# Patient Record
Sex: Female | Born: 1970 | Hispanic: No | Marital: Married | Smoking: Current every day smoker
Health system: Southern US, Community
[De-identification: ages and names within clinical notes are randomized; demographics above are authoritative.]

## PROBLEM LIST (undated history)

## (undated) DIAGNOSIS — J449 Chronic obstructive pulmonary disease, unspecified: Secondary | ICD-10-CM

## (undated) DIAGNOSIS — F1910 Other psychoactive substance abuse, uncomplicated: Secondary | ICD-10-CM

## (undated) DIAGNOSIS — R351 Nocturia: Secondary | ICD-10-CM

## (undated) DIAGNOSIS — C3490 Malignant neoplasm of unspecified part of unspecified bronchus or lung: Secondary | ICD-10-CM

## (undated) DIAGNOSIS — F32A Depression, unspecified: Secondary | ICD-10-CM

## (undated) DIAGNOSIS — B20 Human immunodeficiency virus [HIV] disease: Secondary | ICD-10-CM

## (undated) DIAGNOSIS — F329 Major depressive disorder, single episode, unspecified: Secondary | ICD-10-CM

## (undated) DIAGNOSIS — R42 Dizziness and giddiness: Secondary | ICD-10-CM

## (undated) DIAGNOSIS — R350 Frequency of micturition: Secondary | ICD-10-CM

## (undated) DIAGNOSIS — R51 Headache: Secondary | ICD-10-CM

## (undated) DIAGNOSIS — Z8669 Personal history of other diseases of the nervous system and sense organs: Secondary | ICD-10-CM

## (undated) DIAGNOSIS — R0602 Shortness of breath: Secondary | ICD-10-CM

## (undated) DIAGNOSIS — J45909 Unspecified asthma, uncomplicated: Secondary | ICD-10-CM

## (undated) DIAGNOSIS — R1310 Dysphagia, unspecified: Secondary | ICD-10-CM

## (undated) DIAGNOSIS — Z21 Asymptomatic human immunodeficiency virus [HIV] infection status: Secondary | ICD-10-CM

## (undated) DIAGNOSIS — R569 Unspecified convulsions: Secondary | ICD-10-CM

## (undated) HISTORY — DX: Other psychoactive substance abuse, uncomplicated: F19.10

## (undated) HISTORY — DX: Unspecified asthma, uncomplicated: J45.909

## (undated) HISTORY — PX: BREAST SURGERY: SHX581

---

## 2008-12-10 ENCOUNTER — Encounter: Payer: Self-pay | Admitting: Internal Medicine

## 2008-12-10 LAB — CONVERTED CEMR LAB
CD4 Count: 710 microliters
CD4 T Helper %: 47.3 %
HIV 1 RNA Quant: 1440 copies/mL
Platelets: 382 10*3/uL
WBC: 6 10*3/uL

## 2009-06-08 ENCOUNTER — Encounter: Payer: Self-pay | Admitting: Internal Medicine

## 2009-06-08 LAB — CONVERTED CEMR LAB
Glucose, Bld: 92 mg/dL
Hemoglobin: 13 g/dL
Platelets: 337 10*3/uL

## 2009-10-19 ENCOUNTER — Encounter: Payer: Self-pay | Admitting: Infectious Disease

## 2009-10-26 ENCOUNTER — Encounter: Payer: Self-pay | Admitting: Internal Medicine

## 2009-10-26 DIAGNOSIS — B20 Human immunodeficiency virus [HIV] disease: Secondary | ICD-10-CM | POA: Insufficient documentation

## 2009-10-26 DIAGNOSIS — Z9189 Other specified personal risk factors, not elsewhere classified: Secondary | ICD-10-CM | POA: Insufficient documentation

## 2009-10-26 DIAGNOSIS — F17200 Nicotine dependence, unspecified, uncomplicated: Secondary | ICD-10-CM | POA: Insufficient documentation

## 2009-10-26 DIAGNOSIS — J449 Chronic obstructive pulmonary disease, unspecified: Secondary | ICD-10-CM | POA: Insufficient documentation

## 2009-11-15 ENCOUNTER — Ambulatory Visit: Payer: Self-pay | Admitting: Internal Medicine

## 2009-11-15 LAB — CONVERTED CEMR LAB
AST: 22 units/L (ref 0–37)
Albumin: 4.1 g/dL (ref 3.5–5.2)
Chlamydia, Swab/Urine, PCR: NEGATIVE
Eosinophils Absolute: 0.2 10*3/uL (ref 0.0–0.7)
Eosinophils Relative: 3 % (ref 0–5)
HCT: 38.5 % (ref 36.0–46.0)
HCV Ab: NEGATIVE
HDL: 32 mg/dL — ABNORMAL LOW (ref >39)
HIV-1 antibody: POSITIVE — AB
HIV-2 Ab: NEGATIVE
HIV: REACTIVE
Hep A Total Ab: NEGATIVE
Hepatitis B Surface Ag: NEGATIVE
Ketones, ur: NEGATIVE mg/dL
MCV: 90 fL (ref 78.0–100.0)
Monocytes Absolute: 0.5 10*3/uL (ref 0.1–1.0)
Monocytes Relative: 10 % (ref 3–12)
Neutrophils Relative %: 57 % (ref 43–77)
Nitrite: NEGATIVE
RBC: 4.28 M/uL (ref 3.87–5.11)
RDW: 14.3 % (ref 11.5–15.5)
Urobilinogen, UA: 1 (ref 0.0–1.0)
VLDL: 46 mg/dL — ABNORMAL HIGH (ref 0–40)
WBC: 5.5 10*3/uL (ref 4.0–10.5)
pH: 7.5 (ref 5.0–8.0)

## 2009-11-29 ENCOUNTER — Ambulatory Visit: Payer: Self-pay | Admitting: Internal Medicine

## 2009-11-29 DIAGNOSIS — M545 Low back pain, unspecified: Secondary | ICD-10-CM | POA: Insufficient documentation

## 2009-11-29 DIAGNOSIS — N63 Unspecified lump in unspecified breast: Secondary | ICD-10-CM | POA: Insufficient documentation

## 2009-11-29 DIAGNOSIS — F32A Depression, unspecified: Secondary | ICD-10-CM | POA: Insufficient documentation

## 2009-11-29 DIAGNOSIS — F329 Major depressive disorder, single episode, unspecified: Secondary | ICD-10-CM

## 2009-11-30 ENCOUNTER — Encounter: Admission: RE | Admit: 2009-11-30 | Discharge: 2009-11-30 | Payer: Self-pay | Admitting: Internal Medicine

## 2009-12-14 ENCOUNTER — Encounter: Payer: Self-pay | Admitting: Internal Medicine

## 2009-12-22 ENCOUNTER — Ambulatory Visit: Payer: Self-pay | Admitting: Internal Medicine

## 2010-01-05 ENCOUNTER — Ambulatory Visit: Payer: Self-pay | Admitting: Internal Medicine

## 2010-01-12 ENCOUNTER — Ambulatory Visit: Payer: Self-pay | Admitting: Internal Medicine

## 2010-01-12 ENCOUNTER — Encounter: Payer: Self-pay | Admitting: Internal Medicine

## 2010-01-12 DIAGNOSIS — L02229 Furuncle of trunk, unspecified: Secondary | ICD-10-CM | POA: Insufficient documentation

## 2010-01-12 DIAGNOSIS — J209 Acute bronchitis, unspecified: Secondary | ICD-10-CM | POA: Insufficient documentation

## 2010-01-12 LAB — CONVERTED CEMR LAB
Albumin: 3.8 g/dL (ref 3.5–5.2)
BUN: 11 mg/dL (ref 6–23)
CD4 Count: 666 microliters
CO2: 28 meq/L (ref 19–32)
Calcium: 9 mg/dL (ref 8.4–10.5)
Chloride: 103 meq/L (ref 96–112)
Cholesterol: 124 mg/dL (ref 0–200)
Glucose, Bld: 87 mg/dL (ref 70–99)
HDL: 28 mg/dL — ABNORMAL LOW (ref >39)
HIV-1 RNA Quant, Log: 3.17 — ABNORMAL HIGH (ref <1.68)
LDL Cholesterol: 76 mg/dL (ref 0–99)
Potassium: 3.6 meq/L (ref 3.5–5.3)
Sodium: 139 meq/L (ref 135–145)
Total Bilirubin: 0.5 mg/dL (ref 0.3–1.2)
Total CHOL/HDL Ratio: 4.4

## 2010-01-16 ENCOUNTER — Encounter: Payer: Self-pay | Admitting: Internal Medicine

## 2010-01-17 ENCOUNTER — Emergency Department (HOSPITAL_COMMUNITY): Admission: EM | Admit: 2010-01-17 | Discharge: 2010-01-17 | Payer: Self-pay | Admitting: Family Medicine

## 2010-01-24 ENCOUNTER — Encounter: Payer: Self-pay | Admitting: Internal Medicine

## 2010-01-26 ENCOUNTER — Telehealth (INDEPENDENT_AMBULATORY_CARE_PROVIDER_SITE_OTHER): Payer: Self-pay | Admitting: *Deleted

## 2010-03-09 ENCOUNTER — Encounter: Payer: Self-pay | Admitting: Internal Medicine

## 2010-05-24 ENCOUNTER — Ambulatory Visit: Payer: Self-pay | Admitting: Internal Medicine

## 2010-06-07 ENCOUNTER — Ambulatory Visit: Admit: 2010-06-07 | Payer: Self-pay | Admitting: Internal Medicine

## 2010-07-03 NOTE — Miscellaneous (Signed)
Summary: [place] [facility]  [place] D[facility]   Imported By: Florinda Marker 01/08/2010 10:12:41  _____________________________________________________________________  External Attachment:    Type:   Image     Comment:   External Document

## 2010-07-03 NOTE — Miscellaneous (Signed)
Summary: RW Update  Clinical Lists Changes  Observations: Added new observation of PAYOR: No Insurance (03/09/2010 11:05)

## 2010-07-03 NOTE — Assessment & Plan Note (Signed)
Summary: sick/form filled out/jc   CC:  pt. c/o chest congestion, SHOB, boils on abdomen, sore on lip/ and form filled out, and B/P elevated.  History of Present Illness: Pt c/o  days of sore throat and non-productive cough.  She denies fever but has felt very weak. She also has some painful boils on her lower abdomen. She also needs a vocational rehab form filled out. She is trying to get trained for a job.  Depression History:      The patient is having a depressed mood most of the day and has a diminished interest in her usual daily activities.        The patient denies that she feels like life is not worth living, denies that she wishes that she were dead, and denies that she has thought about ending her life.        Preventive Screening-Counseling & Management  Alcohol-Tobacco     Alcohol drinks/day: 0     Smoking Status: quit > 6 months     Year Started: 1990  Caffeine-Diet-Exercise     Caffeine use/day: coffee 1 per day     Does Patient Exercise: yes     Type of exercise: walking     Exercise (avg: min/session): <30     Times/week: <3  Safety-Violence-Falls     Seat Belt Use: yes      Sexual History:  currently monogamous.        Drug Use:  former, crack cocaine, and clean for 13 months.        Blood Transfusions:  no.    Comments: pt. declined condoms   Updated Prior Medication List: PROVENTIL HFA 108 (90 BASE) MCG/ACT AERS (ALBUTEROL SULFATE) use as directed ZOLOFT 50 MG TABS (SERTRALINE HCL) Take 1 tablet by mouth once a day DOXYCYCLINE HYCLATE 100 MG CAPS (DOXYCYCLINE HYCLATE) Take 1 capsule by mouth two times a day TRAZODONE HCL 50 MG TABS (TRAZODONE HCL) Take 1 tablet by mouth at bedtime  Current Allergies (reviewed today): No known allergies  Review of Systems  The patient denies fever, dyspnea on exertion, and hemoptysis.    Vital Signs:  Patient profile:   40 year old female Menstrual status:  irregular Height:      60 inches (152.40  cm) Weight:      151.12 pounds (68.69 kg) BMI:     29.62 O2 Sat:      98 % on Room air Temp:     97.6 degrees F (36.44 degrees C) oral Pulse rate:   88 / minute BP sitting:   147 / 103  (right arm)  Vitals Entered By: Wendall Mola CMA ( AAMA) (January 12, 2010 10:22 AM)  O2 Flow:  Room air CC: pt. c/o chest congestion, SHOB, boils on abdomen, sore on lip/ and form filled out, B/P elevated Is Patient Diabetic? No Pain Assessment Patient in pain? no      Nutritional Status BMI of 25 - 29 = overweight Nutritional Status Detail appetite "not good"  Does patient need assistance? Functional Status Self care Ambulation Normal   Physical Exam  General:  alert, well-developed, well-nourished, and well-hydrated.   Head:  normocephalic and atraumatic.   Mouth:  pharynx pink and moist, no erythema, and no exudates.  apthous ulcer present Neck:  cervical lymphadenopathy.   Lungs:  normal breath sounds.     Impression & Recommendations:  Problem # 1:  ACUTE BRONCHITIS (ICD-466.0) will treat with Doxycycline - it is on the $4  Rx plan so she can get it since she does not have insurance Her updated medication list for this problem includes:    Proventil Hfa 108 (90 Base) Mcg/act Aers (Albuterol sulfate) ..... Use as directed    Doxycycline Hyclate 100 Mg Caps (Doxycycline hyclate) .Marland Kitchen... Take 1 capsule by mouth two times a day  Orders: Est. Patient Level III (91478)  Problem # 2:  CARBUNCLE AND FURUNCLE OF TRUNK (ICD-680.2) treat with doxy as well Orders: Est. Patient Level III (29562)  Medications Added to Medication List This Visit: 1)  Doxycycline Hyclate 100 Mg Caps (Doxycycline hyclate) .... Take 1 capsule by mouth two times a day 2)  Trazodone Hcl 50 Mg Tabs (Trazodone hcl) .... Take 1 tablet by mouth at bedtime Prescriptions: TRAZODONE HCL 50 MG TABS (TRAZODONE HCL) Take 1 tablet by mouth at bedtime  #30 x 0   Entered and Authorized by:   Yisroel Ramming MD   Signed  by:   Yisroel Ramming MD on 01/12/2010   Method used:   Print then Give to Patient   RxID:   [id number] DOXYCYCLINE HYCLATE 100 MG CAPS (DOXYCYCLINE HYCLATE) Take 1 capsule by mouth two times a day  #20 x 0   Entered and Authorized by:   Yisroel Ramming MD   Signed by:   Yisroel Ramming MD on 01/12/2010   Method used:   Print then Give to Patient   RxID:   [id number]

## 2010-07-03 NOTE — Progress Notes (Signed)
Summary: Voc. Rehab. form  Phone Note Outgoing Call   Call placed by: Annice Pih Summary of Call: Notified pt. that the Vocational Rehab. form was ready to be picked up.  She will p/u this afternoon. Initial call taken by: Wendall Mola CMA Duncan Dull),  January 26, 2010 8:43 AM

## 2010-07-03 NOTE — Assessment & Plan Note (Signed)
Summary: new pt 042 labs/6/15   CC:  new patient to establish, lab results, c/o lump left side of breast x 1 year, and was told at [facility]. 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  History of Present Illness: This is the first ID clinic visit for [facility]  She was diagnosed HIV (+) in 2008.  She has never been on therapy and has been asymptomatic with a low VL and high Cd4ct.  She was recently released from prison and is staying with her Mother. She is going through vocational rehab and is trying to get trained for a job.  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  She also c/o low back pain that is not responding to NSAIDs.  Preventive Screening-Counseling & Management  Alcohol-Tobacco     Alcohol drinks/day: 0     Smoking Status: quit > 6 months     Year Started: 1990  Caffeine-Diet-Exercise     Caffeine use/day: coffee 1 per day     Does Patient Exercise: yes     Type of exercise: walking     Exercise (avg: min/session): <30     Times/week: <3  Safety-Violence-Falls     Seat Belt Use: yes      Sexual History:  currently monogamous.        Drug Use:  former, crack cocaine, and clean for 13 months.        Blood Transfusions:  no.    Comments: pt. declined condoms   Current Allergies (reviewed today): No known allergies  Social History: Sexual History:  currently monogamous Drug Use:  former, crack cocaine, clean for 13 months Blood Transfusions:  no  Review of Systems  The patient denies anorexia, fever, and weight loss.    Vital Signs:  Patient profile:   40 year old female Height:      60 inches (152.40 cm) Weight:      158.4 pounds (72 kg) BMI:     31.05 Temp:     98.2 degrees F (36.78 degrees C) oral Pulse rate:   84 / minute BP sitting:   119 / 79  (left arm)  Vitals Entered By: Wendall Mola CMA Duncan Dull) (November 29, 2009 11:10 AM) CC: new patient to establish, lab results, c/o lump left side of  breast x 1 year, was told at [facility]. 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 No Pain Assessment Patient in pain? yes     Location: low back Intensity: 8 Type: aching Onset of pain  Constant Nutritional Status BMI of > 30 = obese Nutritional Status Detail appetite "great"  Have you ever been in a relationship where you felt threatened, hurt or afraid?No   Does patient need assistance? Functional Status Self care Ambulation Normal   Physical Exam  General:  alert, well-developed, well-nourished, and well-hydrated.   Head:  normocephalic and atraumatic.   Mouth:  pharynx pink and moist.  no thrush  Breasts:  soft tissue mass lateral to right breast Lungs:  normal breath sounds.   Heart:  normal rate and regular rhythm.             Prevention For Positives: 11/29/2009   Safe sex practices discussed with patient. Condoms offered.                             Impression & Recommendations:  Problem # 1:  HIV INFECTION (ICD-042) Pt.s most recent CD4ct was 610 and VL 3810 . The CD4ct is significantly lower than the one done in prison in 1/11 of 1240. We will repeat in 3 months. Pt currently not on therapy.  Schedule PAP.  Diagnostics Reviewed:  HIV: REACTIVE (11/15/2009)   HIV-Western blot: Positive (11/15/2009)   CD4: 610 (11/16/2009)   WBC: 5.5 (11/15/2009)   Hgb: 12.9 (11/15/2009)   HCT: 38.5 (11/15/2009)   Platelets: 330 (11/15/2009) HIV genotype: See Comment (11/15/2009)   HIV-1 RNA: 3810 (11/15/2009)   HBSAg: NEG (11/15/2009)  Orders: New Patient Level III (99203)Future Orders: T-CD4SP ([facility]) (CD4SP) ... 02/27/2010 T-HIV Viral Load [id number]) ... 02/27/2010 T-Comprehensive Metabolic Panel [phone]) ... 02/27/2010 T-CBC w/Diff ([id number]) ... 02/27/2010  Problem # 2:  BREAST MASS, LEFT (ICD-611.72) will obtain a mammogram Orders: Mammogram (Diagnostic) (Mammo)  Problem # 3:  BACK PAIN, LUMBAR (ICD-724.2) ultram for pain. Her updated  medication list for this problem includes:    Ultram 50 Mg Tabs (Tramadol hcl) .Marland Kitchen... Take 1 tablet by mouth every 8 hours as needed  Medications Added to Medication List This Visit: 1)  Prozac 20 Mg Caps (Fluoxetine hcl) .... Take 1 tablet by mouth once a day 2)  Proventil Hfa 108 (90 Base) Mcg/act Aers (Albuterol sulfate) .... Use as directed 3)  Ultram 50 Mg Tabs (Tramadol hcl) .... Take 1 tablet by mouth every 8 hours as needed  Patient Instructions: 1)  Please schedule a follow-up appointment in 3 months, 2 weeks after labs. 2)  Schedule in PAP clinic  Prescriptions: ULTRAM 50 MG TABS (TRAMADOL HCL) Take 1 tablet by mouth every 8 hours as needed  #30 x 0   Entered and Authorized by:   Yisroel Ramming MD   Signed by:   Yisroel Ramming MD on 11/29/2009   Method used:   Print then Give to Patient   RxID:   [id number]

## 2010-07-03 NOTE — Miscellaneous (Signed)
Summary: [facility]  [facility]   Imported By: Florinda Marker 01/25/2010 15:13:31  _____________________________________________________________________  External Attachment:    Type:   Image     Comment:   External Document

## 2010-07-03 NOTE — Consult Note (Signed)
Summary: New Pt. [employer]   New Pt. [facility]   Imported By: Florinda Marker 11/30/2009 16:05:25  _____________________________________________________________________  External Attachment:    Type:   Image     Comment:   External Document

## 2010-07-03 NOTE — Miscellaneous (Signed)
Summary: HIPAA Restrictions  HIPAA Restrictions   Imported By: Florinda Marker 11/17/2009 09:57:28  _____________________________________________________________________  External Attachment:    Type:   Image     Comment:   External Document

## 2010-07-03 NOTE — Assessment & Plan Note (Signed)
Summary: RESEARCH/VS   Vital Signs:  Patient profile:   40 year old female Menstrual status:  irregular Height:      61 inches Weight:      151.5 pounds BMI:     28.73 Temp:     97.4 degrees F oral Pulse rate:   86 / minute Resp:     16 per minute BP sitting:   117 / 68 Is Patient Diabetic? No Research Study Name: START Pain Assessment Patient in pain? yes     Location: throat Nutritional Status BMI of > 30 = obese  Does patient need assistance? Functional Status Self care Ambulation Normal   Allergies: No Known Drug Allergies  Patient screened for the START study today. Informed consent obtained after she read the consent and all questions were answered. She understands that she needs to keep her appointments and follow the study guidelines (if prescribed medication she agrees to take it as directed). She also agreed to the neurology and genomics study today. The neuro tests were performed per protocol. Grooved pegboard results: Rt hand (dominant) - 1 min. 24 sec., Lt Hand - 1 min.42 sec. The other questionnaires, EKG and urine test will be done at the next visit when she comes for a repeat CD4. She does not feel well today and says she has been sick the past week, congested cough, sore throat, bodyaches. She also has 2 sores on her abdomen that developed this past week. She will see Dr. Philipp Deputy at this visit for her signs.Deirdre Evener RN  January 12, 2010 11:29 AM   Other Orders: Est. Patient Research Study [id number]) T-Comprehensive Metabolic Panel [phone]) T-Lipid Profile [phone]) T-HIV Viral Load [phone]) T-Urinalysis Dipstick only [phone])

## 2010-07-03 NOTE — Consult Note (Signed)
Summary: Dr. Freida Busman  Dr. Freida Busman   Imported By: Florinda Marker 01/11/2010 10:48:20  _____________________________________________________________________  External Attachment:    Type:   Image     Comment:   External Document

## 2010-07-03 NOTE — Assessment & Plan Note (Signed)
Summary: PAP SMEAR visit   Vital Signs:  Patient profile:   40 year old female Menstrual status:  irregular LMP:     12/15/2009  Vitals Entered By: Jennet Maduro RN (January 05, 2010 11:40 AM) CC: PAP smear visit.  Pt. given female condoms and dental dams.  Pt. given educational materials re:  HIV and women, diet, nutrition, exercise, co-infection, BSE and PAP smears.  Pt. has left breast mass which is being followed by MD. LMP (date): 12/15/2009 LMP - Character: normal     Menstrual Status irregular Enter LMP: 12/15/2009   Patient Instructions: 1)  Thank you for coming to the PAP smear clinic.  It will take about a week for the results to come back.  I will mail you a letter with your results.  2)  Angelique Blonder   Medical History  Sexual History  Evaluation and Follow-Up  Prevention For Positives: 01/05/2010   Safe sex practices discussed with patient. Condoms offered. Prior Medications: PROVENTIL HFA 108 (90 BASE) MCG/ACT AERS (ALBUTEROL SULFATE) use as directed ZOLOFT 50 MG TABS (SERTRALINE HCL) Take 1 tablet by mouth once a day Current Allergies: No known allergies  Orders Added: 1)  Est. Patient Level I [86578] 2)  T-PAP [facility]) [46962]             Prevention For Positives: 01/05/2010   Safe sex practices discussed with patient. Condoms offered.

## 2010-07-03 NOTE — Miscellaneous (Signed)
Summary:  CD4 (RESEARCH)  Clinical Lists Changes  Observations: Added new observation of CD4 COUNT: 666 microliters (01/12/2010 8:48)

## 2010-07-03 NOTE — Letter (Signed)
Summary: [facility] [facility]  [facility]   Imported By: Florinda Marker 01/26/2010 15:16:32  _____________________________________________________________________  External Attachment:    Type:   Image     Comment:   External Document

## 2010-07-03 NOTE — Miscellaneous (Signed)
Summary: New 042 orders /update  Clinical Lists Changes  Problems: Added new problem of HIV INFECTION (ICD-042) Added new problem of History of  COCAINE ABUSE, HX OF (ICD-V15.9) Added new problem of ASTHMA (ICD-493.90) Added new problem of SMOKER (ICD-305.1) Orders: Added new Test order of T-Comprehensive Metabolic Panel [phone]) - Signed Added new Test order of T-CBC w/Diff [phone]) - Signed Added new Test order of T-CD4SP [facility]) (CD4SP) - Signed Added new Test order of T-HIV Viral Load [phone]) - Signed Added new Test order of T-Chlamydia  Probe, urine [phone]) - Signed Added new Test order of T-GC Probe, urine [phone]) - Signed Added new Test order of T-Hepatitis B Surface Antigen [phone]) - Signed Added new Test order of T-Hepatitis B Surface Antibody ([phone]) - Signed Added new Test order of T-Hepatitis B Core Antibody ([id number]) - Signed Added new Test order of T-Hepatitis C Antibody ([id number]) - Signed Added new Test order of T-HIV Antibody  (Reflex) ([id number]) - Signed Added new Test order of T-HIV Ab Confirmatory Test/Western Blot ([id number]) - Signed Added new Test order of T-Lipid Profile ([id number]) - Signed Added new Test order of T-RPR (Syphilis) ([id number]) - Signed Added new Test order of T-Urinalysis ([id number]) - Signed Added new Test order of T-Hepatitis A Antibody ([id number]) - Signed Added new Test order of T-HIV1 Quant rflx Ultra or Genotype ([id number]) - Signed Observations: Added new observation of TB PPDRESULT: negative (10/26/2009 15:33) Added new observation of PPD RESULT: < 5mm (10/26/2009 15:33) Added new observation of TB-PPD RDDTE: 11/30/2008 (10/26/2009 15:33) Added new observation of CREATININE: .61 mg/dL (71/69/6789 38:10) Added new observation of PLATELETK/UL: 337 K/uL (06/08/2009 16:12) Added new observation of HGB: 13.0 g/dL (17/51/0258 52:77) Added new observation of GLUCOSE SER: 92  mg/dL (82/42/3536 14:43) Added new observation of CD4 COUNT: 1240 microliters (06/08/2009 16:07) Added new observation of HIV1RNA QA: 4240 copies/mL (06/08/2009 15:58) Added new observation of T-HELPER %: 47.7 % (06/08/2009 15:41) Added new observation of CD4 COUNT: 240 microliters (06/08/2009 15:41) Added new observation of FLU VAX: Historical  (04/20/2009 15:38) Added new observation of RPR: non reactive  (12/10/2008 16:20) Added new observation of HIV1RNA QA: 1440 copies/mL (12/10/2008 16:20) Added new observation of PLATELETK/UL: 382 K/uL (12/10/2008 16:19) Added new observation of WBC COUNT: 6.0 10*3/microliter (12/10/2008 16:19) Added new observation of HGB: 13.6 g/dL (15/40/0867 61:95) Added new observation of T-HELPER %: 47.3 % (12/10/2008 16:19) Added new observation of CD4 COUNT: 710 microliters (12/10/2008 16:19)         Immunization History:  Influenza Immunization History:    Influenza:  historical (04/20/2009)  PPD Results    Date of reading: 11/30/2008    Results: < 5mm    Interpretation: negative    -  Date:  06/08/2009    CD4: 1240    CD4: 240    CD4%: 47.7    Viral Load: 4240    Glucose: 92    Hemoglobin: 13.0    Platelets: 337    Creatinine: .61  Date:  12/10/2008    CD4: 710    CD4%: 47.3    Viral Load: 1440    Hemoglobin: 13.6    Platelets: 382    WBC: 6.0    RPR: non reactive

## 2010-07-03 NOTE — Assessment & Plan Note (Signed)
Summary: ?MEDS/VS   CC:  pt. c/o pain medication not working and Prozac causing "heart to race and unable to sleep".  History of Present Illness: Pt states that the prozac is making her heart race and she is unable to sleep.  She states that her dose was increased prior to her discharge from prison.  She does c/o depression and has an appt scheduled at [facility]. She also is going to have the lipoma on her left side removed but is awaiting her orange card to schedule that.  The ultram is not helping her back.  Preventive Screening-Counseling & Management  Alcohol-Tobacco     Alcohol drinks/day: 0     Smoking Status: quit > 6 months     Year Started: 1990  Caffeine-Diet-Exercise     Caffeine use/day: coffee 1 per day     Does Patient Exercise: yes     Type of exercise: walking     Exercise (avg: min/session): <30     Times/week: <3  Safety-Violence-Falls     Seat Belt Use: yes      Sexual History:  currently monogamous.        Drug Use:  former, crack cocaine, and clean for 13 months.        Blood Transfusions:  no.     Updated Prior Medication List: PROVENTIL HFA 108 (90 BASE) MCG/ACT AERS (ALBUTEROL SULFATE) use as directed ZOLOFT 50 MG TABS (SERTRALINE HCL) Take 1 tablet by mouth once a day  Current Allergies (reviewed today): No known allergies  Review of Systems  The patient denies anorexia, fever, and weight loss.    Vital Signs:  Patient profile:   40 year old female Height:      60 inches (152.40 cm) Weight:      156.12 pounds (70.96 kg) BMI:     30.60 Temp:     98.1 degrees F (36.72 degrees C) oral Pulse rate:   92 / minute BP sitting:   115 / 77  (left arm)  Vitals Entered By: Wendall Mola CMA Duncan Dull) (December 22, 2009 11:03 AM) CC: pt. c/o pain medication not working and Prozac causing "heart to race and unable to sleep" Is Patient Diabetic? No Pain Assessment Patient in pain? yes     Location: back Intensity: 9 Type: aching Onset of  pain  Constant Nutritional Status BMI of > 30 = obese Nutritional Status Detail appetite "normal"  Does patient need assistance? Functional Status Self care Ambulation Normal Comments established with [facility], appt.01/01/10   Physical Exam  General:  alert, well-developed, well-nourished, and well-hydrated.   Head:  normocephalic and atraumatic.   Lungs:  normal breath sounds.     Impression & Recommendations:  Problem # 1:  DEPRESSION (ICD-311) will switch to zoloft f/u at [facility] The following medications were removed from the medication list:    Prozac 20 Mg Caps (Fluoxetine hcl) .Marland Kitchen... Take 1 tablet by mouth once a day Her updated medication list for this problem includes:    Zoloft 50 Mg Tabs (Sertraline hcl) .Marland Kitchen... Take 1 tablet by mouth once a day  Problem # 2:  BACK PAIN, LUMBAR (ICD-724.2) refer to PT The following medications were removed from the medication list:    Ultram 50 Mg Tabs (Tramadol hcl) .Marland Kitchen... Take 1 tablet by mouth every 8 hours as needed  Orders: Physical Therapy Referral (PT) Est. Patient Level III (45409)  Medications Added to Medication List This Visit: 1)  Zoloft 50 Mg Tabs (  Sertraline hcl) .... Take 1 tablet by mouth once a day Prescriptions: ZOLOFT 50 MG TABS (SERTRALINE HCL) Take 1 tablet by mouth once a day  #30 x 5   Entered and Authorized by:   Yisroel Ramming MD   Signed by:   Yisroel Ramming MD on 12/22/2009   Method used:   Print then Give to Patient   RxID:   [id number]

## 2010-07-03 NOTE — Letter (Signed)
Summary: Results Follow-up Letter  [facility]  [address]   [place], [place] [address]   Phone: [phone]  Fax: [phone]          January 16, 2010  [address] [place], [place]  [address]  Dear Ms. Oriley,   The following are the results of your recent test(s):  Test     Result     Pap Smear    Normal_XXX___  Not Normal_____       Comments:  Everything was normal.  I will see you in one year for your next PAP smear.    Thank you for coming to the Center for your care.  Sincerely,    Jennet Maduro [facility]

## 2010-07-13 ENCOUNTER — Encounter (INDEPENDENT_AMBULATORY_CARE_PROVIDER_SITE_OTHER): Payer: Self-pay | Admitting: *Deleted

## 2010-07-16 ENCOUNTER — Encounter (INDEPENDENT_AMBULATORY_CARE_PROVIDER_SITE_OTHER): Payer: Self-pay | Admitting: *Deleted

## 2010-07-19 NOTE — Miscellaneous (Signed)
  Clinical Lists Changes  Observations: Added new observation of LATINO/HISP: No (07/13/2010 12:21) Added new observation of RACE: [place] (07/13/2010 12:21)

## 2010-07-25 ENCOUNTER — Encounter (INDEPENDENT_AMBULATORY_CARE_PROVIDER_SITE_OTHER): Payer: Self-pay | Admitting: *Deleted

## 2010-07-25 NOTE — Miscellaneous (Signed)
  Clinical Lists Changes  Observations: Added new observation of PATNTCOUNTY: [place] (07/16/2010 16:18)

## 2010-07-31 NOTE — Miscellaneous (Signed)
  Clinical Lists Changes  Observations: Added new observation of HIV RISK BEH: Heterosexual contact (07/25/2010 16:55)

## 2010-08-17 LAB — CULTURE, ROUTINE-ABSCESS

## 2010-08-19 LAB — T-HELPER CELL (CD4) - (RCID CLINIC ONLY): CD4 % Helper T Cell: 39 % (ref 33–55)

## 2010-08-28 ENCOUNTER — Other Ambulatory Visit: Payer: Self-pay

## 2010-08-28 DIAGNOSIS — B20 Human immunodeficiency virus [HIV] disease: Secondary | ICD-10-CM

## 2010-08-29 LAB — CBC WITH DIFFERENTIAL/PLATELET
Eosinophils Relative: 3 % (ref 0–5)
HCT: 39.3 % (ref 36.0–46.0)
Hemoglobin: 13.3 g/dL (ref 12.0–15.0)
MCH: 30.2 pg (ref 26.0–34.0)
Monocytes Relative: 7 % (ref 3–12)
Neutro Abs: 4.3 10*3/uL (ref 1.7–7.7)
Platelets: 521 10*3/uL — ABNORMAL HIGH (ref 150–400)
RBC: 4.4 MIL/uL (ref 3.87–5.11)
RDW: 14.7 % (ref 11.5–15.5)

## 2010-08-29 LAB — LIPID PANEL
HDL: 31 mg/dL — ABNORMAL LOW (ref >39)
LDL Cholesterol: 99 mg/dL (ref 0–99)

## 2010-08-29 LAB — COMPLETE METABOLIC PANEL WITH GFR
AST: 10 U/L (ref 0–37)
Calcium: 9 mg/dL (ref 8.4–10.5)
GFR, Est African American: >60 mL/min (ref >60)
GFR, Est Non African American: >60 mL/min (ref >60)
Total Protein: 7.3 g/dL (ref 6.0–8.3)

## 2010-08-29 LAB — T-HELPER CELL (CD4) - (RCID CLINIC ONLY): CD4 % Helper T Cell: 48 % (ref 33–55)

## 2010-08-30 LAB — HIV-1 RNA QUANT-NO REFLEX-BLD
HIV 1 RNA Quant: 5770 copies/mL — ABNORMAL HIGH (ref <20)
HIV-1 RNA Quant, Log: 3.76 {Log} — ABNORMAL HIGH (ref <1.30)

## 2010-09-13 ENCOUNTER — Ambulatory Visit: Payer: Self-pay | Admitting: Adult Health

## 2011-03-12 ENCOUNTER — Inpatient Hospital Stay (INDEPENDENT_AMBULATORY_CARE_PROVIDER_SITE_OTHER)
Admission: RE | Admit: 2011-03-12 | Discharge: 2011-03-12 | Disposition: A | Discharge status: Home / Self Care | Payer: Self-pay | Source: Ambulatory Visit | Attending: Emergency Medicine | Admitting: Emergency Medicine

## 2011-03-12 DIAGNOSIS — I10 Essential (primary) hypertension: Secondary | ICD-10-CM

## 2011-03-12 DIAGNOSIS — L270 Generalized skin eruption due to drugs and medicaments taken internally: Secondary | ICD-10-CM

## 2011-03-12 DIAGNOSIS — T50995A Adverse effect of other drugs, medicaments and biological substances, initial encounter: Secondary | ICD-10-CM

## 2011-05-02 ENCOUNTER — Encounter: Payer: Self-pay | Admitting: *Deleted

## 2011-05-14 ENCOUNTER — Other Ambulatory Visit: Payer: Self-pay | Admitting: *Deleted

## 2011-05-14 ENCOUNTER — Other Ambulatory Visit: Payer: Self-pay | Admitting: Infectious Diseases

## 2011-05-14 ENCOUNTER — Telehealth: Payer: Self-pay | Admitting: *Deleted

## 2011-05-14 ENCOUNTER — Other Ambulatory Visit (INDEPENDENT_AMBULATORY_CARE_PROVIDER_SITE_OTHER): Payer: Self-pay

## 2011-05-14 DIAGNOSIS — B20 Human immunodeficiency virus [HIV] disease: Secondary | ICD-10-CM

## 2011-05-14 DIAGNOSIS — N912 Amenorrhea, unspecified: Secondary | ICD-10-CM

## 2011-05-14 NOTE — Telephone Encounter (Signed)
The test was negative for pregnancy. I told her to call if she does not get her period in the next 3 weeks. Gave her 2 bags of condoms. Suggested plenty of lube so it does not break again. She asked for dental dams. Told her we no longer give them out.

## 2011-05-14 NOTE — Progress Notes (Signed)
States her condom broke about 2 weeks ago & she has missed her menses. Urine pregnancy ordered

## 2011-05-15 LAB — T-HELPER CELL (CD4) - (RCID CLINIC ONLY)
CD4 % Helper T Cell: 45 % (ref 33–55)
CD4 T Cell Abs: 930 uL (ref 400–2700)

## 2011-05-15 LAB — COMPLETE METABOLIC PANEL WITH GFR
ALT: 8 U/L (ref 0–35)
AST: 14 U/L (ref 0–37)
Albumin: 3.8 g/dL (ref 3.5–5.2)
Alkaline Phosphatase: 43 U/L (ref 39–117)
BUN: 12 mg/dL (ref 6–23)
CO2: 22 mEq/L (ref 19–32)
Calcium: 9.2 mg/dL (ref 8.4–10.5)
Chloride: 105 mEq/L (ref 96–112)
Creat: 0.56 mg/dL (ref 0.50–1.10)
GFR, Est African American: >89 mL/min
GFR, Est Non African American: >89 mL/min
Glucose, Bld: 60 mg/dL — ABNORMAL LOW (ref 70–99)
Potassium: 4.3 mEq/L (ref 3.5–5.3)
Sodium: 136 mEq/L (ref 135–145)
Total Bilirubin: 0.4 mg/dL (ref 0.3–1.2)
Total Protein: 7.1 g/dL (ref 6.0–8.3)

## 2011-05-15 LAB — CBC WITH DIFFERENTIAL/PLATELET
Basophils Relative: 0 % (ref 0–1)
Eosinophils Relative: 2 % (ref 0–5)
HCT: 41.2 % (ref 36.0–46.0)
Hemoglobin: 13.9 g/dL (ref 12.0–15.0)
Lymphocytes Relative: 29 % (ref 12–46)
Lymphs Abs: 2 10*3/uL (ref 0.7–4.0)
MCH: 31 pg (ref 26.0–34.0)
MCV: 91.8 fL (ref 78.0–100.0)
RDW: 15.1 % (ref 11.5–15.5)
WBC: 7 10*3/uL (ref 4.0–10.5)

## 2011-05-16 LAB — HIV-1 RNA QUANT-NO REFLEX-BLD
HIV 1 RNA Quant: 5750 copies/mL — ABNORMAL HIGH (ref <20)
HIV-1 RNA Quant, Log: 3.76 {Log} — ABNORMAL HIGH (ref <1.30)

## 2011-05-22 ENCOUNTER — Telehealth: Payer: Self-pay

## 2011-05-22 NOTE — Telephone Encounter (Signed)
Left message for patient to call back concerning [name] financial asst - will try and coordinate with her Dr appt in January.

## 2011-06-11 ENCOUNTER — Ambulatory Visit: Payer: Self-pay | Admitting: Infectious Disease

## 2011-06-12 ENCOUNTER — Telehealth: Payer: Self-pay | Admitting: Licensed Clinical Social Worker

## 2011-06-12 NOTE — Telephone Encounter (Signed)
I spoke with this patient and rescheduled her for 06/19/2011 at 10:15am

## 2011-06-19 ENCOUNTER — Telehealth: Payer: Self-pay | Admitting: *Deleted

## 2011-06-19 ENCOUNTER — Ambulatory Visit: Payer: Self-pay | Admitting: Infectious Disease

## 2011-06-19 NOTE — Telephone Encounter (Signed)
Called patient to reschedule OV. Pt stated she did not have a ride today to come to OV today. Rescheduled for 06/28/11 at 10"45 am. Tacey Heap RN

## 2011-06-28 ENCOUNTER — Ambulatory Visit: Payer: Self-pay | Admitting: Infectious Disease

## 2012-05-30 ENCOUNTER — Emergency Department (HOSPITAL_BASED_OUTPATIENT_CLINIC_OR_DEPARTMENT_OTHER)
Admission: EM | Admit: 2012-05-30 | Discharge: 2012-05-31 | Disposition: A | Discharge status: Home / Self Care | Payer: Self-pay | Attending: Emergency Medicine | Admitting: Emergency Medicine

## 2012-05-30 ENCOUNTER — Encounter (HOSPITAL_BASED_OUTPATIENT_CLINIC_OR_DEPARTMENT_OTHER): Payer: Self-pay | Admitting: *Deleted

## 2012-05-30 DIAGNOSIS — Z79899 Other long term (current) drug therapy: Secondary | ICD-10-CM | POA: Insufficient documentation

## 2012-05-30 DIAGNOSIS — B20 Human immunodeficiency virus [HIV] disease: Secondary | ICD-10-CM | POA: Insufficient documentation

## 2012-05-30 DIAGNOSIS — F17200 Nicotine dependence, unspecified, uncomplicated: Secondary | ICD-10-CM | POA: Insufficient documentation

## 2012-05-30 DIAGNOSIS — L0291 Cutaneous abscess, unspecified: Secondary | ICD-10-CM

## 2012-05-30 DIAGNOSIS — IMO0002 Reserved for concepts with insufficient information to code with codable children: Secondary | ICD-10-CM | POA: Insufficient documentation

## 2012-05-30 DIAGNOSIS — G40909 Epilepsy, unspecified, not intractable, without status epilepticus: Secondary | ICD-10-CM | POA: Insufficient documentation

## 2012-05-30 HISTORY — DX: Asymptomatic human immunodeficiency virus (hiv) infection status: Z21

## 2012-05-30 HISTORY — DX: Human immunodeficiency virus (HIV) disease: B20

## 2012-05-30 HISTORY — DX: Unspecified convulsions: R56.9

## 2012-05-30 MED ORDER — LIDOCAINE HCL 2 % IJ SOLN
5.0000 mL | Freq: Once | INTRAMUSCULAR | Status: DC
  Filled 2012-05-30: qty 20

## 2012-05-30 NOTE — ED Notes (Signed)
Suture cart with I&D tray out side of pt room

## 2012-05-30 NOTE — ED Notes (Addendum)
Boil right axillary region x 2 days. Hx same. No hx of MRSA, "but mother does". In tx now for cocaine use. HIV+

## 2012-05-31 NOTE — ED Provider Notes (Signed)
Medical screening examination/treatment/procedure(s) were performed by non-physician practitioner and as supervising physician I was immediately available for consultation/collaboration.  Ethelda Chick, MD 05/31/12 1725

## 2012-05-31 NOTE — ED Provider Notes (Signed)
History     CSN: [id number]  Arrival date & time 05/30/12  2015   First MD Initiated Contact with Patient 05/30/12 2223      Chief Complaint  Patient presents with  . Recurrent Skin Infections    (Consider location/radiation/quality/duration/timing/severity/associated sxs/prior treatment) Patient is a 41 y.o. female presenting with abscess. The history is provided by the patient. No language interpreter was used.  Abscess  This is a recurrent problem. The current episode started less than one week ago. The onset was gradual. The problem occurs occasionally. The problem has been gradually worsening. Affected Location: Right axilla. The problem is mild. The abscess is characterized by redness and painfulness. Pertinent negatives include no fever and no vomiting.   2 cm abscess to right axilla x3 days. Tender to touch and move her right arm.   Past Medical History  Diagnosis Date  . HIV (human immunodeficiency virus infection)   . Seizures     Past Surgical History  Procedure Date  . Cesarean section     History reviewed. No pertinent family history.  History  Substance Use Topics  . Smoking status: Current Every Day Smoker  . Smokeless tobacco: Not on file  . Alcohol Use: No    OB History    Grav Para Term Preterm Abortions TAB SAB Ect Mult Living                  Review of Systems  Constitutional: Negative.  Negative for fever.  HENT: Negative.   Eyes: Negative.   Respiratory: Negative.   Cardiovascular: Negative.   Gastrointestinal: Negative.  Negative for vomiting.  Skin:       Abscess  Neurological: Negative.   Psychiatric/Behavioral: Negative.   All other systems reviewed and are negative.    Allergies  Gabapentin  Home Medications   Current Outpatient Rx  Name  Route  Sig  Dispense  Refill  . DIVALPROEX SODIUM 250 MG PO TBEC   Oral   Take 250 mg by mouth daily at 2 am.         . QUETIAPINE FUMARATE 200 MG PO TABS   Oral   Take 200 mg by  mouth at bedtime.         . ALBUTEROL SULFATE HFA 108 (90 BASE) MCG/ACT IN AERS   Inhalation   Inhale 2 puffs into the lungs as needed.           . SERTRALINE HCL 50 MG PO TABS   Oral   Take 50 mg by mouth daily.           . TRAZODONE HCL 50 MG PO TABS   Oral   Take 50 mg by mouth at bedtime.             BP 107/76  Pulse 96  Temp 98.4 F (36.9 C) (Oral)  Resp 20  Ht 5' (1.524 m)  Wt 115 lb (52.164 kg)  BMI 22.46 kg/m2  SpO2 100%  LMP 05/23/2012  Physical Exam  Nursing note and vitals reviewed. Constitutional: She is oriented to person, place, and time. She appears well-developed and well-nourished.  HENT:  Head: Normocephalic and atraumatic.  Eyes: Conjunctivae normal and EOM are normal. Pupils are equal, round, and reactive to light.  Neck: Normal range of motion. Neck supple.  Cardiovascular: Normal rate.   Pulmonary/Chest: Effort normal.  Abdominal: Soft.  Musculoskeletal: Normal range of motion. She exhibits no edema and no tenderness.  Neurological: She is alert and oriented  to person, place, and time. She has normal reflexes.  Skin: Skin is warm and dry.       Abscess under right arm 2 cm no cellulitis  Psychiatric: She has a normal mood and affect.    ED Course  INCISION AND DRAINAGE Date/Time: 05/30/2012 5:20 PM Performed by: Remi Haggard Authorized by: Remi Haggard Consent: Verbal consent obtained. Risks and benefits: risks, benefits and alternatives were discussed Consent given by: patient Patient understanding: patient states understanding of the procedure being performed Patient identity confirmed: verbally with patient, arm band, provided demographic data and hospital-assigned identification number Type: abscess Body area: upper extremity (Right axilla) Anesthesia: local infiltration Local anesthetic: lidocaine 2% without epinephrine Anesthetic total: 2 ml Patient sedated: no Scalpel size: 11 Complexity: simple Drainage amount:  scant Wound treatment: wound left open Patient tolerance: Patient tolerated the procedure well with no immediate complications.   (including critical care time)  Labs Reviewed - No data to display No results found.   No diagnosis found.    MDM  I&D of right axilla abscess 2 cm no packing. Disorder card and no followup because it was so small. She can take ibuprofen for pain. No cellulitis        Remi Haggard, NP 05/31/12 1722

## 2013-03-29 ENCOUNTER — Emergency Department (HOSPITAL_COMMUNITY): Payer: Self-pay

## 2013-03-29 ENCOUNTER — Emergency Department (HOSPITAL_COMMUNITY): Payer: No Typology Code available for payment source

## 2013-03-29 ENCOUNTER — Emergency Department (HOSPITAL_COMMUNITY)
Admission: EM | Admit: 2013-03-29 | Discharge: 2013-03-30 | Disposition: A | Discharge status: Home / Self Care | Payer: No Typology Code available for payment source | Attending: Emergency Medicine | Admitting: Emergency Medicine

## 2013-03-29 ENCOUNTER — Encounter (HOSPITAL_COMMUNITY): Payer: Self-pay | Admitting: Emergency Medicine

## 2013-03-29 DIAGNOSIS — R071 Chest pain on breathing: Secondary | ICD-10-CM | POA: Insufficient documentation

## 2013-03-29 DIAGNOSIS — R918 Other nonspecific abnormal finding of lung field: Secondary | ICD-10-CM

## 2013-03-29 DIAGNOSIS — N644 Mastodynia: Secondary | ICD-10-CM | POA: Insufficient documentation

## 2013-03-29 DIAGNOSIS — Z79899 Other long term (current) drug therapy: Secondary | ICD-10-CM | POA: Insufficient documentation

## 2013-03-29 DIAGNOSIS — R1032 Left lower quadrant pain: Secondary | ICD-10-CM | POA: Insufficient documentation

## 2013-03-29 DIAGNOSIS — N63 Unspecified lump in unspecified breast: Secondary | ICD-10-CM | POA: Insufficient documentation

## 2013-03-29 DIAGNOSIS — R222 Localized swelling, mass and lump, trunk: Secondary | ICD-10-CM | POA: Insufficient documentation

## 2013-03-29 DIAGNOSIS — Z3202 Encounter for pregnancy test, result negative: Secondary | ICD-10-CM | POA: Insufficient documentation

## 2013-03-29 DIAGNOSIS — Z21 Asymptomatic human immunodeficiency virus [HIV] infection status: Secondary | ICD-10-CM | POA: Insufficient documentation

## 2013-03-29 DIAGNOSIS — G40909 Epilepsy, unspecified, not intractable, without status epilepticus: Secondary | ICD-10-CM | POA: Insufficient documentation

## 2013-03-29 DIAGNOSIS — F17200 Nicotine dependence, unspecified, uncomplicated: Secondary | ICD-10-CM | POA: Insufficient documentation

## 2013-03-29 DIAGNOSIS — R197 Diarrhea, unspecified: Secondary | ICD-10-CM | POA: Insufficient documentation

## 2013-03-29 LAB — URINALYSIS, ROUTINE W REFLEX MICROSCOPIC
Glucose, UA: NEGATIVE mg/dL
Hgb urine dipstick: NEGATIVE
Ketones, ur: NEGATIVE mg/dL
Protein, ur: NEGATIVE mg/dL
Specific Gravity, Urine: 1.018 (ref 1.005–1.030)
Urobilinogen, UA: 1 mg/dL (ref 0.0–1.0)

## 2013-03-29 LAB — COMPREHENSIVE METABOLIC PANEL
ALT: 9 U/L (ref 0–35)
AST: 17 U/L (ref 0–37)
Alkaline Phosphatase: 56 U/L (ref 39–117)
CO2: 23 mEq/L (ref 19–32)
Calcium: 9 mg/dL (ref 8.4–10.5)
Creatinine, Ser: 0.54 mg/dL (ref 0.50–1.10)
Potassium: 3.5 mEq/L (ref 3.5–5.1)
Sodium: 132 mEq/L — ABNORMAL LOW (ref 135–145)
Total Protein: 7.5 g/dL (ref 6.0–8.3)

## 2013-03-29 LAB — URINE MICROSCOPIC-ADD ON

## 2013-03-29 LAB — RAPID URINE DRUG SCREEN, HOSP PERFORMED
Amphetamines: NOT DETECTED
Cocaine: NOT DETECTED
Opiates: NOT DETECTED
Tetrahydrocannabinol: NOT DETECTED

## 2013-03-29 LAB — CBC WITH DIFFERENTIAL/PLATELET
Basophils Absolute: 0 10*3/uL (ref 0.0–0.1)
Basophils Relative: 0 % (ref 0–1)
Eosinophils Absolute: 0.1 10*3/uL (ref 0.0–0.7)
Eosinophils Relative: 3 % (ref 0–5)
Lymphocytes Relative: 33 % (ref 12–46)
MCH: 30.6 pg (ref 26.0–34.0)
MCHC: 35.7 g/dL (ref 30.0–36.0)
MCV: 85.6 fL (ref 78.0–100.0)
Monocytes Absolute: 0.5 10*3/uL (ref 0.1–1.0)
Platelets: 285 10*3/uL (ref 150–400)
RDW: 12.6 % (ref 11.5–15.5)
WBC: 5.7 10*3/uL (ref 4.0–10.5)

## 2013-03-29 LAB — POCT PREGNANCY, URINE: Preg Test, Ur: NEGATIVE

## 2013-03-29 MED ORDER — ONDANSETRON HCL 4 MG/2ML IJ SOLN
4.0000 mg | Freq: Once | INTRAMUSCULAR | Status: AC
Start: 2013-03-29 — End: 2013-03-29
  Administered 2013-03-29: 4 mg via INTRAVENOUS
  Filled 2013-03-29: qty 2

## 2013-03-29 MED ORDER — IOHEXOL 300 MG/ML  SOLN
50.0000 mL | Freq: Once | INTRAMUSCULAR | Status: AC | PRN
  Administered 2013-03-29: 50 mL via ORAL

## 2013-03-29 MED ORDER — HYDROMORPHONE HCL PF 1 MG/ML IJ SOLN
1.0000 mg | Freq: Once | INTRAMUSCULAR | Status: AC
  Administered 2013-03-30: 1 mg via INTRAVENOUS
  Filled 2013-03-29: qty 1

## 2013-03-29 MED ORDER — HYDROMORPHONE HCL PF 1 MG/ML IJ SOLN
1.0000 mg | Freq: Once | INTRAMUSCULAR | Status: AC
  Administered 2013-03-29: 1 mg via INTRAVENOUS
  Filled 2013-03-29: qty 1

## 2013-03-29 MED ORDER — IOHEXOL 300 MG/ML  SOLN
100.0000 mL | Freq: Once | INTRAMUSCULAR | Status: AC | PRN
  Administered 2013-03-29: 100 mL via INTRAVENOUS

## 2013-03-29 MED ORDER — SODIUM CHLORIDE 0.9 % IV BOLUS (SEPSIS)
1000.0000 mL | Freq: Once | INTRAVENOUS | Status: AC
  Administered 2013-03-29: 1000 mL via INTRAVENOUS

## 2013-03-29 MED ORDER — ONDANSETRON HCL 4 MG/2ML IJ SOLN
4.0000 mg | Freq: Once | INTRAMUSCULAR | Status: AC
  Administered 2013-03-30: 4 mg via INTRAVENOUS
  Filled 2013-03-29: qty 2

## 2013-03-29 NOTE — ED Notes (Signed)
Patient transported to CT 

## 2013-03-29 NOTE — ED Provider Notes (Signed)
CSN: [id number]     Arrival date & time 03/29/13  2049 History   First MD Initiated Contact with Patient 03/29/13 2125     Chief Complaint  Patient presents with  . Shortness of Breath  . Diarrhea  . Breast Pain   (Consider location/radiation/quality/duration/timing/severity/associated sxs/prior Treatment) HPI Comments: Patient who is HIV positive and currently followed by ID at [facility] in [place] and who has not had her antiretrovirals in the past 2 months presents with multiple complaints.  The first is left lateral chest wall mass and pain associated with this.  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  She denies fever, chills, redness at the site, drainage from the mass or nipple.  She also complains of shortness of breath which has been worsening over the past several weeks, she reports worse with activity, denies fever, chills, cough, congestion, nausea or vomiting.  She also complains of watery diarrhea over the past several weeks as well.  She reports 20 episodes of watery non-bloody diarrhea daily, she reports some crampy LLQ abdominal pain which is episodic.  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  She also reports that her seizures have increased as well and that she had a seizure in her sleep last night.  Patient is a 42 y.o. female presenting with shortness of breath and diarrhea. The history is provided by the patient and a parent. No language interpreter was used.  Shortness of Breath Severity:  Moderate Onset quality:  Gradual Duration:  2 weeks Timing:  Intermittent Progression:  Worsening Chronicity:  Recurrent Context: activity and weather changes   Relieved by:  Nothing Worsened by:  Nothing tried Ineffective  treatments:  None tried Associated symptoms: abdominal pain and chest pain   Associated symptoms: no cough, no diaphoresis, no fever, no headaches, no neck pain, no rash, no sore throat, no sputum production, no vomiting and no wheezing   Diarrhea Associated symptoms: abdominal pain   Associated symptoms: no diaphoresis, no fever, no headaches and no vomiting     Past Medical History  Diagnosis Date  . HIV (human immunodeficiency virus infection)   . Seizures    Past Surgical History  Procedure Laterality Date  . Cesarean section     Family History  Problem Relation Age of Onset  . Diabetes Mother   . CAD Mother   . Hypertension Mother   . Hypertension Sister   . Cancer Other    History  Substance Use Topics  . Smoking status: Current Every Day Smoker    Types: Cigarettes  . Smokeless tobacco: Not on file  . Alcohol Use: No   OB History   Grav Para Term Preterm Abortions TAB SAB Ect Mult Living                 Review of Systems  Constitutional: Negative for fever and diaphoresis.  HENT: Negative for sore throat.   Respiratory: Positive for shortness of breath. Negative for cough, sputum production and wheezing.   Cardiovascular: Positive for chest pain.  Gastrointestinal: Positive for abdominal pain and diarrhea. Negative for vomiting.  Musculoskeletal: Negative for neck pain.  Skin: Negative for rash.  Neurological: Negative  for headaches.  All other systems reviewed and are negative.    Allergies  Gabapentin  Home Medications   Current Outpatient Rx  Name  Route  Sig  Dispense  Refill  . divalproex (DEPAKOTE) 250 MG DR tablet   Oral   Take 250 mg by mouth daily at 2 am.         . QUEtiapine (SEROQUEL) 200 MG tablet   Oral   Take 200 mg by mouth at bedtime.         . traZODone (DESYREL) 50 MG tablet   Oral   Take 50 mg by mouth at bedtime.            BP 130/87  Pulse 80  Temp(Src) 98.6 F (37 C) (Oral)  Resp 16  SpO2 99%  LMP  03/23/2013 Physical Exam  Nursing note and vitals reviewed. Constitutional: She is oriented to person, place, and time. She appears well-developed and well-nourished. No distress.  HENT:  Head: Normocephalic and atraumatic.  Right Ear: External ear normal.  Left Ear: External ear normal.  Nose: Nose normal.  Mouth/Throat: Oropharynx is clear and moist. No oropharyngeal exudate.  Eyes: Conjunctivae are normal. Pupils are equal, round, and reactive to light. No scleral icterus.  Neck: Normal range of motion. Neck supple.  Cardiovascular: Normal rate, regular rhythm and normal heart sounds.  Exam reveals no gallop and no friction rub.   No murmur heard. Pulmonary/Chest: Effort normal and breath sounds normal. No respiratory distress. She has no wheezes. She has no rales. She exhibits tenderness.    Abdominal: Soft. Bowel sounds are normal. She exhibits no distension and no mass. There is tenderness in the left lower quadrant. There is no rebound and no guarding.    Genitourinary: There is breast tenderness. No breast swelling or discharge. Pelvic exam was performed with patient supine.  Musculoskeletal: Normal range of motion. She exhibits no edema and no tenderness.  Lymphadenopathy:    She has no cervical adenopathy.  Neurological: She is alert and oriented to person, place, and time. She exhibits normal muscle tone. Coordination normal.  Skin: Skin is warm and dry. No rash noted. No erythema. No pallor.  Psychiatric: She has a normal mood and affect. Her behavior is normal. Judgment and thought content normal.    ED Course  Procedures (including critical care time) Labs Review Labs Reviewed  CBC WITH DIFFERENTIAL  COMPREHENSIVE METABOLIC PANEL  LIPASE, BLOOD  URINALYSIS, ROUTINE W REFLEX MICROSCOPIC  URINE RAPID DRUG SCREEN (HOSP PERFORMED)   Imaging Review Dg Chest 2 View  03/29/2013   CLINICAL DATA:  Chest pain, shortness of Breath.  EXAM: CHEST  2 VIEW  COMPARISON:  None.   FINDINGS: 2.3 cm focal opacity in the right upper lobe. There is some linear scarring or subsegmental atelectasis in the right midlung and at the left lung base. Heart size upper limits normal. No effusion. Regional bones unremarkable.  IMPRESSION: 1. 23 mm right upper lobe nodular density, mass versus focal pneumonia. Followup recommended.   Electronically Signed   By: Oley Balm M.D.   On: 03/29/2013 22:13    EKG Interpretation   None      Results for orders placed during the hospital encounter of 03/29/13  CBC WITH DIFFERENTIAL      Result Value Range   WBC 5.7  4.0 - 10.5 K/uL   RBC 4.38  3.87 - 5.11 MIL/uL   Hemoglobin 13.4  12.0 - 15.0 g/dL   HCT 37.5  36.0 - 46.0 %   MCV 85.6  78.0 - 100.0 fL   MCH 30.6  26.0 - 34.0 pg   MCHC 35.7  30.0 - 36.0 g/dL   RDW 16.1  09.6 - 04.5 %   Platelets 285  150 - 400 K/uL   Neutrophils Relative % 55  43 - 77 %   Neutro Abs 3.1  1.7 - 7.7 K/uL   Lymphocytes Relative 33  12 - 46 %   Lymphs Abs 1.9  0.7 - 4.0 K/uL   Monocytes Relative 9  3 - 12 %   Monocytes Absolute 0.5  0.1 - 1.0 K/uL   Eosinophils Relative 3  0 - 5 %   Eosinophils Absolute 0.1  0.0 - 0.7 K/uL   Basophils Relative 0  0 - 1 %   Basophils Absolute 0.0  0.0 - 0.1 K/uL  COMPREHENSIVE METABOLIC PANEL      Result Value Range   Sodium 132 (*) 135 - 145 mEq/L   Potassium 3.5  3.5 - 5.1 mEq/L   Chloride 101  96 - 112 mEq/L   CO2 23  19 - 32 mEq/L   Glucose, Bld 97  70 - 99 mg/dL   BUN 10  6 - 23 mg/dL   Creatinine, Ser 4.09  0.50 - 1.10 mg/dL   Calcium 9.0  8.4 - 81.1 mg/dL   Total Protein 7.5  6.0 - 8.3 g/dL   Albumin 3.5  3.5 - 5.2 g/dL   AST 17  0 - 37 U/L   ALT 9  0 - 35 U/L   Alkaline Phosphatase 56  39 - 117 U/L   Total Bilirubin 0.3  0.3 - 1.2 mg/dL   GFR calc non Af Amer >90  >90 mL/min   GFR calc Af Amer >90  >90 mL/min  LIPASE, BLOOD      Result Value Range   Lipase 26  11 - 59 U/L  URINALYSIS, ROUTINE W REFLEX MICROSCOPIC      Result Value Range    Color, Urine YELLOW  YELLOW   APPearance CLEAR  CLEAR   Specific Gravity, Urine 1.018  1.005 - 1.030   pH 5.5  5.0 - 8.0   Glucose, UA NEGATIVE  NEGATIVE mg/dL   Hgb urine dipstick NEGATIVE  NEGATIVE   Bilirubin Urine NEGATIVE  NEGATIVE   Ketones, ur NEGATIVE  NEGATIVE mg/dL   Protein, ur NEGATIVE  NEGATIVE mg/dL   Urobilinogen, UA 1.0  0.0 - 1.0 mg/dL   Nitrite NEGATIVE  NEGATIVE   Leukocytes, UA SMALL (*) NEGATIVE  URINE RAPID DRUG SCREEN (HOSP PERFORMED)      Result Value Range   Opiates NONE DETECTED  NONE DETECTED   Cocaine NONE DETECTED  NONE DETECTED   Benzodiazepines NONE DETECTED  NONE DETECTED   Amphetamines NONE DETECTED  NONE DETECTED   Tetrahydrocannabinol NONE DETECTED  NONE DETECTED   Barbiturates NONE DETECTED  NONE DETECTED  URINE MICROSCOPIC-ADD ON      Result Value Range   Squamous Epithelial / LPF RARE  RARE   WBC, UA 0-2  <3 WBC/hpf   RBC / HPF 0-2  <3 RBC/hpf   Bacteria, UA RARE  RARE   Urine-Other MUCOUS PRESENT    POCT PREGNANCY, URINE      Result Value Range   Preg Test, Ur NEGATIVE  NEGATIVE   Dg Chest 2 View  03/29/2013   CLINICAL DATA:  Chest pain, shortness of Breath.  EXAM: CHEST  2  VIEW  COMPARISON:  None.  FINDINGS: 2.3 cm focal opacity in the right upper lobe. There is some linear scarring or subsegmental atelectasis in the right midlung and at the left lung base. Heart size upper limits normal. No effusion. Regional bones unremarkable.  IMPRESSION: 1. 23 mm right upper lobe nodular density, mass versus focal pneumonia. Followup recommended.   Electronically Signed   By: Oley Balm M.D.   On: 03/29/2013 22:13   Ct Chest W Contrast  03/30/2013   CLINICAL DATA:  Abnormal chest radiograph, mass versus pneumonia.  EXAM: CT CHEST WITH CONTRAST  TECHNIQUE: Multidetector CT imaging of the chest was performed during intravenous contrast administration.  CONTRAST:  80mL OMNIPAQUE IOHEXOL 300 MG/ML  SOLN  COMPARISON:  Chest radiograph. March 29, 2013  and CT of the abdomen and pelvis March 29, 2013.  FINDINGS: Right upper lobe 24 x 15 x 18 mm (transverse by AP by CC) mildly enhancing mass with somewhat indistinct, spiculated margins within the right upper lobe, posterior segment, abutting the pleura. Associated minimal thickening of the pleural reflection. Minimal dependent atelectasis. 3 mm subpleural right upper lobe. Pulmonary nodule in addition. No pleural effusions.  Tracheobronchial tree is patent and midline. No pneumothorax. Mild apical centrilobular emphysema.  Bilateral enlarged axillary lymph nodes measuring up to 11 mm on the left. Tiny anterior mediastinal lymph nodes, without mediastinal, hilar lymphadenopathy by CT size criteria. The heart and pericardium are unremarkable. Limited view of the abdomen is unremarkable. Soft tissues including the thyroid gland are normal. Minimal degenerative change of the thoracolumbar junction. Mild scoliosis.  IMPRESSION: 24 x 15 x 18 mm mildly enhancing right upper lobe pulmonary mass with indistinct, spiculated margins, and this may reflect primary lung neoplasm, possibly pneumonia, this would be atypical for metastatic disease or septic emboli. Recommend clinical correlation, recommend followup CT of the chest at 3 months, versus biopsy.   Electronically Signed   By: Awilda Metro   On: 03/30/2013 00:36   Ct Abdomen Pelvis W Contrast  03/29/2013   CLINICAL DATA:  Shortness of breath.  Diarrhea.  EXAM: CT ABDOMEN AND PELVIS WITH CONTRAST  TECHNIQUE: Multidetector CT imaging of the abdomen and pelvis was performed using the standard protocol following bolus administration of intravenous contrast.  CONTRAST:  50mL OMNIPAQUE IOHEXOL 300 MG/ML SOLN, OMNIPAQUE IOHEXOL 300 MG/ML SOLN  COMPARISON:  None.  FINDINGS: Dependent atelectasis in the visualized lung bases. Unremarkable liver, nondilated gallbladder, spleen, adrenal glands, kidneys, pancreas. Mild plaque in the abdominal aorta without aneurysm  or stenosis. Portal vein patent. Stomach, small bowel, and colon are nondilated. Normal appendix. Urinary bladder physiologically distended. Uterus and adnexal regions unremarkable. No ascites. No free air. No adenopathy. Lumbar spine unremarkable.  IMPRESSION: 1. No acute abdominal process.   Electronically Signed   By: Oley Balm M.D.   On: 03/29/2013 23:56      MDM  Right Upper lobe chest mass Diarrhea Lipoma  Patient who is HIV+ and ? AIDS presents with multiple complaints including shortness of breath and diarrhea as well as pain.  CT scan of abdomen and labs are non-concerning.  She was noted to have mass to RUL of lung, with differential being PNA versus primary lung cancer, clinically I am favoring the latter.  I will put her on a course of zithromax and an inhaler to help with the shortness of breath.  She will be started on imodium for the diarrhea, no signs of dehydration.  As for pain, I doubt the  lipoma is truly the source for the pain but will give a short course of narcotics.  She is encouraged to be seen by her ID doctors at [facility] so she can be scheduled for repeat CT scan versus biopsy.   Izola Price Marisue Humble, PA-C 03/30/13 0101

## 2013-03-29 NOTE — ED Notes (Signed)
Pt states she has been having shortness of breath, diarrhea, and has a lump in her left breast  Pt states she has HIV and states she is feeling worse than normal  Pt states she feels like her whole body is on fire

## 2013-03-30 ENCOUNTER — Encounter (HOSPITAL_COMMUNITY): Payer: Self-pay

## 2013-03-30 MED ORDER — ALBUTEROL SULFATE HFA 108 (90 BASE) MCG/ACT IN AERS: 2.0000 | INHALATION_SPRAY | RESPIRATORY_TRACT | Status: DC | PRN

## 2013-03-30 MED ORDER — IOHEXOL 300 MG/ML  SOLN
80.0000 mL | Freq: Once | INTRAMUSCULAR | Status: AC | PRN
  Administered 2013-03-30: 80 mL via INTRAVENOUS

## 2013-03-30 MED ORDER — LOPERAMIDE HCL 2 MG PO CAPS: 2.0000 mg | ORAL_CAPSULE | Freq: Four times a day (QID) | ORAL | Status: DC | PRN

## 2013-03-30 MED ORDER — HYDROCODONE-ACETAMINOPHEN 5-325 MG PO TABS: 1.0000 | ORAL_TABLET | ORAL | Status: DC | PRN

## 2013-03-30 MED ORDER — AZITHROMYCIN 250 MG PO TABS: 250.0000 mg | ORAL_TABLET | Freq: Every day | ORAL | Status: DC

## 2013-03-30 NOTE — ED Provider Notes (Signed)
Medical screening examination/treatment/procedure(s) were performed by non-physician practitioner and as supervising physician I was immediately available for consultation/collaboration.  EKG Interpretation   None         Suzi Roots, MD 03/30/13 1248

## 2013-04-01 ENCOUNTER — Encounter (HOSPITAL_COMMUNITY): Payer: Self-pay | Admitting: Emergency Medicine

## 2013-04-01 ENCOUNTER — Emergency Department (HOSPITAL_COMMUNITY)
Admission: EM | Admit: 2013-04-01 | Discharge: 2013-04-01 | Disposition: A | Discharge status: Home / Self Care | Payer: No Typology Code available for payment source | Attending: Emergency Medicine | Admitting: Emergency Medicine

## 2013-04-01 ENCOUNTER — Emergency Department (HOSPITAL_COMMUNITY): Payer: No Typology Code available for payment source

## 2013-04-01 ENCOUNTER — Other Ambulatory Visit (INDEPENDENT_AMBULATORY_CARE_PROVIDER_SITE_OTHER): Payer: Self-pay

## 2013-04-01 DIAGNOSIS — S91309A Unspecified open wound, unspecified foot, initial encounter: Secondary | ICD-10-CM | POA: Insufficient documentation

## 2013-04-01 DIAGNOSIS — F17200 Nicotine dependence, unspecified, uncomplicated: Secondary | ICD-10-CM | POA: Insufficient documentation

## 2013-04-01 DIAGNOSIS — Y929 Unspecified place or not applicable: Secondary | ICD-10-CM | POA: Insufficient documentation

## 2013-04-01 DIAGNOSIS — Z79899 Other long term (current) drug therapy: Secondary | ICD-10-CM | POA: Insufficient documentation

## 2013-04-01 DIAGNOSIS — G40909 Epilepsy, unspecified, not intractable, without status epilepticus: Secondary | ICD-10-CM | POA: Insufficient documentation

## 2013-04-01 DIAGNOSIS — Y9389 Activity, other specified: Secondary | ICD-10-CM | POA: Insufficient documentation

## 2013-04-01 DIAGNOSIS — B20 Human immunodeficiency virus [HIV] disease: Secondary | ICD-10-CM

## 2013-04-01 DIAGNOSIS — T07XXXA Unspecified multiple injuries, initial encounter: Secondary | ICD-10-CM

## 2013-04-01 DIAGNOSIS — Z21 Asymptomatic human immunodeficiency virus [HIV] infection status: Secondary | ICD-10-CM | POA: Insufficient documentation

## 2013-04-01 DIAGNOSIS — Z113 Encounter for screening for infections with a predominantly sexual mode of transmission: Secondary | ICD-10-CM

## 2013-04-01 DIAGNOSIS — W268XXA Contact with other sharp object(s), not elsewhere classified, initial encounter: Secondary | ICD-10-CM | POA: Insufficient documentation

## 2013-04-01 LAB — CBC WITH DIFFERENTIAL/PLATELET
Basophils Absolute: 0 10*3/uL (ref 0.0–0.1)
Basophils Relative: 0 % (ref 0–1)
Eosinophils Absolute: 0.2 10*3/uL (ref 0.0–0.7)
HCT: 39.5 % (ref 36.0–46.0)
Hemoglobin: 13.9 g/dL (ref 12.0–15.0)
MCH: 30.8 pg (ref 26.0–34.0)
MCHC: 35.2 g/dL (ref 30.0–36.0)
Monocytes Absolute: 0.4 10*3/uL (ref 0.1–1.0)
Monocytes Relative: 8 % (ref 3–12)
Neutro Abs: 3.4 10*3/uL (ref 1.7–7.7)
Neutrophils Relative %: 62 % (ref 43–77)
Platelets: 294 10*3/uL (ref 150–400)

## 2013-04-01 LAB — LIPID PANEL
HDL: 22 mg/dL — ABNORMAL LOW (ref >39)
LDL Cholesterol: 55 mg/dL (ref 0–99)
Total CHOL/HDL Ratio: 6.3 Ratio
Triglycerides: 303 mg/dL — ABNORMAL HIGH (ref <150)
VLDL: 61 mg/dL — ABNORMAL HIGH (ref 0–40)

## 2013-04-01 LAB — COMPREHENSIVE METABOLIC PANEL
ALT: 8 U/L (ref 0–35)
AST: 14 U/L (ref 0–37)
Albumin: 3.9 g/dL (ref 3.5–5.2)
Alkaline Phosphatase: 56 U/L (ref 39–117)
Glucose, Bld: 77 mg/dL (ref 70–99)
Potassium: 4.6 mEq/L (ref 3.5–5.3)
Sodium: 135 mEq/L (ref 135–145)
Total Bilirubin: 0.3 mg/dL (ref 0.3–1.2)
Total Protein: 7.3 g/dL (ref 6.0–8.3)

## 2013-04-01 MED ORDER — IBUPROFEN 800 MG PO TABS
800.0000 mg | ORAL_TABLET | Freq: Once | ORAL | Status: AC
  Administered 2013-04-01: 800 mg via ORAL
  Filled 2013-04-01: qty 1

## 2013-04-01 NOTE — ED Notes (Signed)
Pt states that she was pushing out a glass door and stepped on glass and has lacerations to bil feet, bleeding controlled.

## 2013-04-01 NOTE — ED Provider Notes (Signed)
Medical screening examination/treatment/procedure(s) were performed by non-physician practitioner and as supervising physician I was immediately available for consultation/collaboration.  EKG Interpretation   None        Lynette Topete F Jerika Wales, MD 04/01/13 2324 

## 2013-04-01 NOTE — Addendum Note (Signed)
Addended bySteva Colder on: 04/01/2013 11:12 AM   Modules accepted: Orders

## 2013-04-01 NOTE — ED Notes (Signed)
Pt soaking feet in saline and betadine

## 2013-04-01 NOTE — ED Provider Notes (Signed)
CSN: [id number]     Arrival date & time 04/01/13  2043 History  This chart was scribed for non-physician practitioner Earley Favor, FNP, working with Shon Baton, MD, by Yevette Edwards, ED Scribe. This patient was seen in room WTR6/WTR6 and the patient's care was started at 9:42 PM.   First MD Initiated Contact with Patient 04/01/13 2131     Chief Complaint  Patient presents with  . Laceration    The history is provided by the patient. No language interpreter was used.   HPI Comments: Elizabeth [name] is a 42 y.o. female, with a h/o HIV, who presents to the Emergency Department complaining of multiple lacerations to the dorsa and soles of her feet bilaterally. She states that she was pushing a glass door closed, and the glass fell out of the door onto her foot. Then, unaware that the glass had fallen onto the floor, she walked across the glass.  The lacerations bled initially, but there is no active bleeding currently. She reports experiencing pain to both feet, with the left foot more significant than the right foot. She rates the pain as 7/10. She denies pain anywhere else. The pt is a current smoker.   Past Medical History  Diagnosis Date  . HIV (human immunodeficiency virus infection)   . Seizures    Past Surgical History  Procedure Laterality Date  . Cesarean section     Family History  Problem Relation Age of Onset  . Diabetes Mother   . CAD Mother   . Hypertension Mother   . Hypertension Sister   . Cancer Other    History  Substance Use Topics  . Smoking status: Current Every Day Smoker    Types: Cigarettes  . Smokeless tobacco: Not on file  . Alcohol Use: No   No OB history provided.  Review of Systems  Constitutional: Negative for fever.  Musculoskeletal: Positive for myalgias (Right and left feet).  Skin: Positive for wound.    Allergies  Gabapentin  Home Medications   Current Outpatient Rx  Name  Route  Sig  Dispense  Refill  . albuterol (PROVENTIL  HFA;VENTOLIN HFA) 108 (90 BASE) MCG/ACT inhaler   Inhalation   Inhale 2 puffs into the lungs every 4 (four) hours as needed for wheezing.   1 Inhaler   0   . azithromycin (ZITHROMAX) 250 MG tablet   Oral   Take 1 tablet (250 mg total) by mouth daily. Take first 2 tablets together, then 1 every day until finished.   6 tablet   0   . divalproex (DEPAKOTE) 250 MG DR tablet   Oral   Take 250 mg by mouth daily at 2 am.         . HYDROcodone-acetaminophen (NORCO/VICODIN) 5-325 MG per tablet   Oral   Take 1 tablet by mouth every 4 (four) hours as needed for pain.   20 tablet   0   . loperamide (IMODIUM) 2 MG capsule   Oral   Take 1 capsule (2 mg total) by mouth 4 (four) times daily as needed for diarrhea or loose stools.   12 capsule   0   . QUEtiapine (SEROQUEL) 200 MG tablet   Oral   Take 200 mg by mouth at bedtime.         . traZODone (DESYREL) 50 MG tablet   Oral   Take 50 mg by mouth at bedtime.            Triage Vitals:  BP 134/100  Pulse 87  Temp(Src) 98.2 F (36.8 C) (Oral)  Resp 16  SpO2 98%  LMP 03/23/2013  Physical Exam  Nursing note and vitals reviewed. Constitutional: She is oriented to person, place, and time. She appears well-developed and well-nourished. No distress.  HENT:  Head: Normocephalic and atraumatic.  Eyes: EOM are normal.  Neck: Neck supple. No tracheal deviation present.  Cardiovascular: Normal rate.   Pulmonary/Chest: Effort normal. No respiratory distress.  Musculoskeletal: Normal range of motion.  Neurological: She is alert and oriented to person, place, and time.  Skin: Skin is warm and dry.  Psychiatric: She has a normal mood and affect. Her behavior is normal.    ED Course  Procedures (including critical care time)  DIAGNOSTIC STUDIES: Oxygen Saturation is 98% on room air, normal by my interpretation.    COORDINATION OF CARE:  9:46 PM- Discussed treatment plan with patient, and the patient agreed to the plan.    10:50 PM- Rechecked pt. Explored the wound to her left foot. No foreign bodies discovered. Informed pt that she would be provided pain medication. Also advised pt to monitor for signs of infection.    Labs Review Labs Reviewed - No data to display Imaging Review Dg Foot Complete Left  04/01/2013   CLINICAL DATA:  Laceration  EXAM: LEFT FOOT - COMPLETE 3+ VIEW  COMPARISON:  None.  FINDINGS: There is no evidence of fracture or dislocation. There is no evidence of arthropathy or other focal bone abnormality. 1-2 mm radiodense foreign body projects in the soft tissues at the plantar aspect of the 5th MTP joint. Regional soft tissue swelling noted.  IMPRESSION: 1. Small radiodense foreign body in the soft tissues at the plantar aspect of the 5th MTP joint. 2. No bone abnormality.   Electronically Signed   By: Oley Balm M.D.   On: 04/01/2013 21:28   Dg Foot Complete Right  04/01/2013   CLINICAL DATA:  Lacerations  EXAM: RIGHT FOOT COMPLETE - 3+ VIEW  COMPARISON:  None.  FINDINGS: There is no evidence of fracture or dislocation. There is no evidence of arthropathy or other focal bone abnormality. Soft tissues are unremarkable. No radiodense foreign body or subcutaneous gas.  IMPRESSION: Negative.   Electronically Signed   By: Oley Balm M.D.   On: 04/01/2013 21:19    EKG Interpretation   None       MDM  No diagnosis found. X-ray, reviewed.  Punctate portion of glass, at the base of the metatarsal, not evident on examination All wounds or puncture wounds none requiring suturing.  These have been cleaned and dressed with antibiotic ointment with dressing applied  I personally performed the services described in this documentation, which was scribed in my presence. The recorded information has been reviewed and is accurate.   Arman Filter, NP 04/01/13 2308

## 2013-04-02 LAB — HIV-1 RNA QUANT-NO REFLEX-BLD: HIV-1 RNA Quant, Log: 4.66 {Log} — ABNORMAL HIGH (ref <1.30)

## 2013-04-02 LAB — T-HELPER CELL (CD4) - (RCID CLINIC ONLY): CD4 T Cell Abs: 560 /uL (ref 400–2700)

## 2013-04-13 ENCOUNTER — Encounter: Payer: Self-pay | Admitting: Internal Medicine

## 2013-04-13 ENCOUNTER — Ambulatory Visit (INDEPENDENT_AMBULATORY_CARE_PROVIDER_SITE_OTHER): Payer: Self-pay | Admitting: Internal Medicine

## 2013-04-13 ENCOUNTER — Telehealth: Payer: Self-pay | Admitting: *Deleted

## 2013-04-13 ENCOUNTER — Other Ambulatory Visit: Payer: Self-pay | Admitting: *Deleted

## 2013-04-13 VITALS — BP 115/54 | HR 77 | Temp 98.1°F | Ht 60.0 in | Wt 147.0 lb

## 2013-04-13 DIAGNOSIS — R222 Localized swelling, mass and lump, trunk: Secondary | ICD-10-CM

## 2013-04-13 DIAGNOSIS — R918 Other nonspecific abnormal finding of lung field: Secondary | ICD-10-CM

## 2013-04-13 DIAGNOSIS — N926 Irregular menstruation, unspecified: Secondary | ICD-10-CM

## 2013-04-13 DIAGNOSIS — N939 Abnormal uterine and vaginal bleeding, unspecified: Secondary | ICD-10-CM

## 2013-04-13 DIAGNOSIS — B20 Human immunodeficiency virus [HIV] disease: Secondary | ICD-10-CM

## 2013-04-13 DIAGNOSIS — F329 Major depressive disorder, single episode, unspecified: Secondary | ICD-10-CM

## 2013-04-13 DIAGNOSIS — F17200 Nicotine dependence, unspecified, uncomplicated: Secondary | ICD-10-CM

## 2013-04-13 DIAGNOSIS — Z9189 Other specified personal risk factors, not elsewhere classified: Secondary | ICD-10-CM

## 2013-04-13 DIAGNOSIS — F3289 Other specified depressive episodes: Secondary | ICD-10-CM

## 2013-04-13 MED ORDER — ELVITEG-COBIC-EMTRICIT-TENOFDF 150-150-200-300 MG PO TABS: 1.0000 | ORAL_TABLET | Freq: Every day | ORAL | Status: DC

## 2013-04-13 NOTE — Assessment & Plan Note (Signed)
Slowing down her smoking goes not interested in anything to help her quit at this time her

## 2013-04-13 NOTE — Telephone Encounter (Addendum)
Patient has been referred to [facility] [place] [facility], appointment 11/14 at 10:00.  Pt referred via EPIC to [facility], they will call to schedule the patient.  Patient needs the phone number to [facility] for PCP care - RN was unsuccessful in attempting to schedule an appointment for her.

## 2013-04-13 NOTE — Assessment & Plan Note (Signed)
She continues to follow drug abuse counselor

## 2013-04-13 NOTE — Assessment & Plan Note (Signed)
She is not interested in other counseling.

## 2013-04-13 NOTE — Addendum Note (Signed)
Addended by: Andree Coss on: 04/13/2013 01:09 PM   Modules accepted: Orders

## 2013-04-13 NOTE — Assessment & Plan Note (Signed)
She will be referred to GYN:

## 2013-04-13 NOTE — Addendum Note (Signed)
Addended by: Andree Coss on: 04/13/2013 02:20 PM   Modules accepted: Orders

## 2013-04-13 NOTE — Assessment & Plan Note (Signed)
She is interested in treatment and has finished paperwork for drug assistance program. She will be prescribed Stribild. Side effects of medication discussed with the patient. She will return in about 2-3 weeks for followup to assure that she got her medication and is not having any side effects. Her lab visit and will be scheduled.

## 2013-04-13 NOTE — Assessment & Plan Note (Signed)
Unclear if this is cancerous. I will have her referred to pulmonary for further evaluation

## 2013-04-13 NOTE — Progress Notes (Signed)
  Subjective:    Patient ID: Elizabeth [name], female    DOB: 30-Mar-1971, 42 y.o.   MRN: [id number]  HPI Here to establish care for HIV. Tells me she previously had gone to [facility] briefly though no records available and this was more than 2 years ago. She has never been on HIV medications. 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 Was recently in jail but has been out and is on probation now.  Since starting treatment and feels she will do well with taking medication daily.  She was recently emergency room for shortness of breath and was diagnosed by CAT scan with a pulmonary mass. This has not yet been evaluated. She had no knowledge of this before. She does smoke cigarettes. There is a history of lung cancer in her family. 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 No weight loss, no night sweats. She complains of pain in the area.  She has had some menstrual bleeding. In her emergency room visit recently, her pregnancy test was negative. Not associated with any abdominal pain.  She does smoke though has been clean of drugs alcohol.     Review of Systems  Constitutional: Negative for fever, activity change, appetite change, fatigue and unexpected weight change.  HENT: Negative for sore throat and trouble swallowing.   Eyes: Negative for visual disturbance.  Respiratory: Negative for cough and shortness of breath.   Cardiovascular: Negative for chest pain and leg swelling.  Gastrointestinal: Negative for nausea, vomiting, abdominal pain and diarrhea.  Genitourinary: Positive for vaginal bleeding.  Musculoskeletal: Positive for back pain.  Skin: Negative for rash.  Allergic/Immunologic: Negative for environmental allergies.  Neurological: Negative for dizziness, light-headedness and headaches.  Hematological: Negative for adenopathy.  Psychiatric/Behavioral: Negative for sleep disturbance and dysphoric mood. The patient is not nervous/anxious.         Objective:   Physical Exam  Constitutional: She is oriented to person, place, and time. She appears well-developed and well-nourished. No distress.  HENT:  Mouth/Throat: No oropharyngeal exudate.  Eyes: Right eye exhibits no discharge. Left eye exhibits no discharge. No scleral icterus.  Cardiovascular: Normal rate, regular rhythm and normal heart sounds.   No murmur heard. Pulmonary/Chest: Effort normal and breath sounds normal. No respiratory distress. She has no wheezes.  Abdominal: Soft. Bowel sounds are normal. She exhibits no distension. There is no tenderness.  Lymphadenopathy:    She has no cervical adenopathy.  Neurological: She is alert and oriented to person, place, and time.  Skin: Skin is warm and dry. No rash noted.  Psychiatric: She has a normal mood and affect. Her behavior is normal.          Assessment & Plan:

## 2013-04-16 ENCOUNTER — Ambulatory Visit (INDEPENDENT_AMBULATORY_CARE_PROVIDER_SITE_OTHER): Payer: Self-pay | Admitting: Internal Medicine

## 2013-04-16 ENCOUNTER — Encounter: Payer: Self-pay | Admitting: Internal Medicine

## 2013-04-16 VITALS — BP 114/80 | HR 78 | Temp 97.9°F | Ht 61.0 in | Wt 149.0 lb

## 2013-04-16 DIAGNOSIS — F17200 Nicotine dependence, unspecified, uncomplicated: Secondary | ICD-10-CM

## 2013-04-16 DIAGNOSIS — J45909 Unspecified asthma, uncomplicated: Secondary | ICD-10-CM

## 2013-04-16 DIAGNOSIS — R918 Other nonspecific abnormal finding of lung field: Secondary | ICD-10-CM

## 2013-04-16 DIAGNOSIS — R222 Localized swelling, mass and lump, trunk: Secondary | ICD-10-CM

## 2013-04-16 MED ORDER — AMOXICILLIN-POT CLAVULANATE 875-125 MG PO TABS: 1.0000 | ORAL_TABLET | Freq: Two times a day (BID) | ORAL | Status: DC

## 2013-04-16 NOTE — Progress Notes (Signed)
  Subjective:    Patient ID: Elizabeth [name], female    DOB: 08/16/1970  MRN: [id number]  HPI  42 yowf HIV since 2008 actively smoking referred to pulmonary clinic 04/16/2013 by Dr comer with sob and cough and RUL density.   04/16/2013 1st [place] [facility] office visit/ Kala Gassmann  Chief Complaint  Patient presents with  . Pulmonary Consult    Referred per Dr. Valene Bors for eval of abn CT Chest. The pt c/o CP and dyspnea x 5 months. She states that she gets out of breath just walking from one room to the next and wakes up gasping for air. She also c/o prod cough with minimal yellow sputum x 5 months.    cp anterior x sev weeks, worse with cough. Sputum never bloody  zpak helped some  saba helps doe some  Poor dentition but no risk factors for asp  No obvious day to day or daytime variabilty or assoc chest tightness, subjective wheeze overt sinus or hb symptoms. No unusual exp hx or h/o childhood pna/ asthma or knowledge of premature birth.   . Also denies any obvious fluctuation of symptoms with weather or environmental changes or other aggravating or alleviating factors except as outlined above   Current Medications, Allergies, Complete Past Medical History, Past Surgical History, Family History, and Social History were reviewed in [employer] record.       Review of Systems  Constitutional: Negative for fever, chills and unexpected weight change.  HENT: Positive for dental problem, sore throat and trouble swallowing. Negative for congestion, ear pain, nosebleeds, postnasal drip, rhinorrhea, sinus pressure, sneezing and voice change.   Eyes: Negative for visual disturbance.  Respiratory: Positive for cough and shortness of breath. Negative for choking.   Cardiovascular: Positive for chest pain. Negative for leg swelling.  Gastrointestinal: Negative for vomiting, abdominal pain and diarrhea.  Genitourinary: Negative for difficulty urinating.  Musculoskeletal:  Negative for arthralgias.  Skin: Negative for rash.  Neurological: Positive for headaches. Negative for tremors and syncope.  Hematological: Does not bruise/bleed easily.  Psychiatric/Behavioral: The patient is nervous/anxious.        Objective:   Physical Exam Anxious wf nad  Wt Readings from Last 3 Encounters:  04/16/13 149 lb (67.586 kg)  04/13/13 147 lb (66.679 kg)  05/30/12 115 lb (52.164 kg)     HEENT mild turbinate edema.  Oropharynx no thrush or excess pnd or cobblestoning.  No JVD or cervical adenopathy. Mild accessory muscle hypertrophy. Trachea midline, nl thryroid. Chest was hyperinflated by percussion with diminished breath sounds and moderate increased exp time without wheeze. Hoover sign positive at mid inspiration. Regular rate and rhythm without murmur gallop or rub or increase P2 or edema.  Abd: no hsm, nl excursion. Ext warm without cyanosis or clubbing.     CT chest 03/30/13 24 x 15 x 18 mm mildly enhancing right upper lobe pulmonary mass  with indistinct, spiculated margins, and this may reflect primary  lung neoplasm, possibly pneumonia      Assessment & Plan:

## 2013-04-16 NOTE — Patient Instructions (Addendum)
The key is to stop smoking completely before smoking completely stops you!   Augmentin 875 mg take one pill twice daily  X 10 days - take at breakfast and supper with large glass of water.  It would help reduce the usual side effects (diarrhea and yeast infections) if you ate cultured yogurt at lunch.   Please schedule a follow up office visit in 2 weeks, sooner if needed  Late cxr on return  Inquire re sz next ov

## 2013-04-17 ENCOUNTER — Encounter: Payer: Self-pay | Admitting: Internal Medicine

## 2013-04-17 NOTE — Assessment & Plan Note (Signed)
Assoc with poor dentition in pt actively smoking with frankly purulent sputum suggests, esp in this location (post segment RUL) this may be a lung abscess with somewhat atypical course in pt with HIV > will rx x 10 days with augmentin then regroup with cxr and possible FOB next step.

## 2013-04-17 NOTE — Assessment & Plan Note (Signed)
DDX of  difficult airways managment all start with A and  include Adherence, Ace Inhibitors, Acid Reflux, Active Sinus Disease, Alpha 1 Antitripsin deficiency, Anxiety masquerading as Airways dz,  ABPA,  allergy(esp in young), Aspiration (esp in elderly), Adverse effects of DPI,  Active smokers, plus two Bs  = Bronchiectasis and Beta blocker use..and one C= CHF  Adherence is always the initial "prime suspect" and is a multilayered concern that requires a "trust but verify" approach in every patient - starting with knowing how to use medications, especially inhalers, correctly, keeping up with refills and understanding the fundamental difference between maintenance and prns vs those medications only taken for a very short course and then stopped and not refilled. The proper method of use, as well as anticipated side effects, of a metered-dose inhaler are discussed and demonstrated to the patient. Improved effectiveness after extensive coaching during this visit to a level of approximately  75%  Active smoking > discussed separately

## 2013-04-17 NOTE — Assessment & Plan Note (Signed)

## 2013-04-24 ENCOUNTER — Emergency Department (HOSPITAL_COMMUNITY)
Admission: EM | Admit: 2013-04-24 | Discharge: 2013-04-25 | Disposition: A | Discharge status: Home / Self Care | Payer: No Typology Code available for payment source | Attending: Emergency Medicine | Admitting: Emergency Medicine

## 2013-04-24 ENCOUNTER — Emergency Department (HOSPITAL_COMMUNITY): Payer: No Typology Code available for payment source

## 2013-04-24 ENCOUNTER — Encounter (HOSPITAL_COMMUNITY): Payer: Self-pay | Admitting: Emergency Medicine

## 2013-04-24 DIAGNOSIS — R569 Unspecified convulsions: Secondary | ICD-10-CM

## 2013-04-24 DIAGNOSIS — J45909 Unspecified asthma, uncomplicated: Secondary | ICD-10-CM | POA: Insufficient documentation

## 2013-04-24 DIAGNOSIS — F17200 Nicotine dependence, unspecified, uncomplicated: Secondary | ICD-10-CM | POA: Insufficient documentation

## 2013-04-24 DIAGNOSIS — Z91199 Patient's noncompliance with other medical treatment and regimen due to unspecified reason: Secondary | ICD-10-CM | POA: Insufficient documentation

## 2013-04-24 DIAGNOSIS — S8990XA Unspecified injury of unspecified lower leg, initial encounter: Secondary | ICD-10-CM | POA: Insufficient documentation

## 2013-04-24 DIAGNOSIS — G40909 Epilepsy, unspecified, not intractable, without status epilepticus: Secondary | ICD-10-CM | POA: Insufficient documentation

## 2013-04-24 DIAGNOSIS — Z79899 Other long term (current) drug therapy: Secondary | ICD-10-CM | POA: Insufficient documentation

## 2013-04-24 DIAGNOSIS — R918 Other nonspecific abnormal finding of lung field: Secondary | ICD-10-CM

## 2013-04-24 DIAGNOSIS — S0990XA Unspecified injury of head, initial encounter: Secondary | ICD-10-CM | POA: Insufficient documentation

## 2013-04-24 DIAGNOSIS — Z9119 Patient's noncompliance with other medical treatment and regimen: Secondary | ICD-10-CM | POA: Insufficient documentation

## 2013-04-24 DIAGNOSIS — Y92009 Unspecified place in unspecified non-institutional (private) residence as the place of occurrence of the external cause: Secondary | ICD-10-CM | POA: Insufficient documentation

## 2013-04-24 DIAGNOSIS — Z792 Long term (current) use of antibiotics: Secondary | ICD-10-CM | POA: Insufficient documentation

## 2013-04-24 DIAGNOSIS — R222 Localized swelling, mass and lump, trunk: Secondary | ICD-10-CM | POA: Insufficient documentation

## 2013-04-24 DIAGNOSIS — Z9114 Patient's other noncompliance with medication regimen: Secondary | ICD-10-CM

## 2013-04-24 DIAGNOSIS — W010XXA Fall on same level from slipping, tripping and stumbling without subsequent striking against object, initial encounter: Secondary | ICD-10-CM | POA: Insufficient documentation

## 2013-04-24 DIAGNOSIS — Z21 Asymptomatic human immunodeficiency virus [HIV] infection status: Secondary | ICD-10-CM | POA: Insufficient documentation

## 2013-04-24 DIAGNOSIS — G8929 Other chronic pain: Secondary | ICD-10-CM | POA: Insufficient documentation

## 2013-04-24 DIAGNOSIS — Y939 Activity, unspecified: Secondary | ICD-10-CM | POA: Insufficient documentation

## 2013-04-24 LAB — CBC WITH DIFFERENTIAL/PLATELET
Basophils Absolute: 0 10*3/uL (ref 0.0–0.1)
Basophils Relative: 0 % (ref 0–1)
Eosinophils Relative: 4 % (ref 0–5)
HCT: 35.3 % — ABNORMAL LOW (ref 36.0–46.0)
Hemoglobin: 12.7 g/dL (ref 12.0–15.0)
Lymphocytes Relative: 37 % (ref 12–46)
Lymphs Abs: 1.8 10*3/uL (ref 0.7–4.0)
MCH: 30.2 pg (ref 26.0–34.0)
MCHC: 36 g/dL (ref 30.0–36.0)
MCV: 84 fL (ref 78.0–100.0)
Monocytes Absolute: 0.5 10*3/uL (ref 0.1–1.0)
Monocytes Relative: 9 % (ref 3–12)
Neutro Abs: 2.5 10*3/uL (ref 1.7–7.7)
Neutrophils Relative %: 50 % (ref 43–77)
RBC: 4.2 MIL/uL (ref 3.87–5.11)
WBC: 5 10*3/uL (ref 4.0–10.5)

## 2013-04-24 LAB — COMPREHENSIVE METABOLIC PANEL
ALT: 6 U/L (ref 0–35)
Alkaline Phosphatase: 57 U/L (ref 39–117)
BUN: 14 mg/dL (ref 6–23)
CO2: 21 mEq/L (ref 19–32)
Calcium: 9 mg/dL (ref 8.4–10.5)
Chloride: 102 mEq/L (ref 96–112)
Creatinine, Ser: 0.65 mg/dL (ref 0.50–1.10)
GFR calc Af Amer: >90 mL/min (ref >90)
GFR calc non Af Amer: >90 mL/min (ref >90)
Glucose, Bld: 108 mg/dL — ABNORMAL HIGH (ref 70–99)
Sodium: 133 mEq/L — ABNORMAL LOW (ref 135–145)
Total Protein: 6.9 g/dL (ref 6.0–8.3)

## 2013-04-24 MED ORDER — VALPROATE SODIUM 500 MG/5ML IV SOLN: 500.0000 mg | Freq: Once | INTRAVENOUS | Status: DC

## 2013-04-24 MED ORDER — MORPHINE SULFATE 4 MG/ML IJ SOLN
4.0000 mg | INTRAMUSCULAR | Status: DC | PRN
  Administered 2013-04-24: 4 mg via INTRAVENOUS
  Filled 2013-04-24: qty 1

## 2013-04-24 MED ORDER — HYDROCODONE-ACETAMINOPHEN 5-325 MG PO TABS: 1.0000 | ORAL_TABLET | Freq: Four times a day (QID) | ORAL | Status: DC | PRN

## 2013-04-24 MED ORDER — KETOROLAC TROMETHAMINE 30 MG/ML IJ SOLN
30.0000 mg | Freq: Once | INTRAMUSCULAR | Status: AC
  Administered 2013-04-24: 30 mg via INTRAVENOUS
  Filled 2013-04-24: qty 1

## 2013-04-24 MED ORDER — HYDROCODONE-ACETAMINOPHEN 5-325 MG PO TABS: 2.0000 | ORAL_TABLET | ORAL | Status: DC | PRN

## 2013-04-24 MED ORDER — VALPROATE SODIUM 500 MG/5ML IV SOLN
500.0000 mg | Freq: Once | INTRAVENOUS | Status: AC
  Administered 2013-04-24: 500 mg via INTRAVENOUS
  Filled 2013-04-24: qty 5

## 2013-04-24 MED ORDER — DIVALPROEX SODIUM 500 MG PO DR TAB: 500.0000 mg | DELAYED_RELEASE_TABLET | Freq: Two times a day (BID) | ORAL | Status: DC

## 2013-04-24 MED ORDER — DIVALPROEX SODIUM ER 500 MG PO TB24: 500.0000 mg | ORAL_TABLET | Freq: Every day | ORAL | Status: DC

## 2013-04-24 MED ORDER — ONDANSETRON HCL 4 MG/2ML IJ SOLN
4.0000 mg | Freq: Once | INTRAMUSCULAR | Status: AC
  Administered 2013-04-24: 4 mg via INTRAVENOUS
  Filled 2013-04-24: qty 2

## 2013-04-24 NOTE — ED Notes (Signed)
Bed: ZO10 Expected date: 04/24/13 Expected time: 8:16 PM Means of arrival: Ambulance Comments: 42 yo F  Seizures

## 2013-04-24 NOTE — ED Provider Notes (Signed)
CSN: [id number]     Arrival date & time 04/24/13  2038 History   First MD Initiated Contact with Patient 04/24/13 2102     Chief Complaint  Patient presents with  . Seizures   HPI  Patient presents after a seizure at home today prior to seizures back-to-back. Told her mother she was going to have one. Then sat down. Had a seizure. Slid to the floor no hard impact. However mother states her head Wo bouncing up and down the floor". She complains of headache and pain in her legs. Has chronic leg pain. She is a smoker. She has no lung mass. She's been seeing a pulmonologist "antibiotic shots". Mother states that they're "trying to jump an infection so they can do a biopsy but they think it's cancer". Seizure disorder for many years. No Depakote for many months. No nausea vomiting or fevers no chills asymptomatic now other than "a bad headache in my legs hurt".    Past Medical History  Diagnosis Date  . HIV (human immunodeficiency virus infection)   . Seizures   . Substance abuse   . Asthma    Past Surgical History  Procedure Laterality Date  . Cesarean section      x 3   Family History  Problem Relation Age of Onset  . Diabetes Mother   . CAD Mother   . Hypertension Mother   . Hypertension Sister   . Lung cancer Maternal Aunt     smoked  . Allergies Sister   . Asthma Sister   . Lung cancer Maternal Uncle     never smoker  . Lung cancer Maternal Grandmother     never smoker   History  Substance Use Topics  . Smoking status: Current Every Day Smoker -- 1.00 packs/day for 23 years    Types: Cigarettes  . Smokeless tobacco: Never Used  . Alcohol Use: No   OB History   Grav Para Term Preterm Abortions TAB SAB Ect Mult Living                 Review of Systems  Constitutional: Negative for fever, chills, diaphoresis, appetite change and fatigue.  HENT: Negative for mouth sores, sore throat and trouble swallowing.   Eyes: Negative for visual disturbance.  Respiratory:  Negative for cough, chest tightness, shortness of breath and wheezing.   Cardiovascular: Negative for chest pain.  Gastrointestinal: Negative for nausea, vomiting, abdominal pain, diarrhea and abdominal distention.  Endocrine: Negative for polydipsia, polyphagia and polyuria.  Genitourinary: Negative for dysuria, frequency and hematuria.  Musculoskeletal: Positive for arthralgias and myalgias. Negative for gait problem.  Skin: Negative for color change, pallor and rash.  Neurological: Positive for seizures and headaches. Negative for dizziness, syncope and light-headedness.  Hematological: Does not bruise/bleed easily.  Psychiatric/Behavioral: Negative for behavioral problems and confusion.    Allergies  Gabapentin  Home Medications   Current Outpatient Rx  Name  Route  Sig  Dispense  Refill  . albuterol (PROVENTIL HFA;VENTOLIN HFA) 108 (90 BASE) MCG/ACT inhaler   Inhalation   Inhale 2 puffs into the lungs every 4 (four) hours as needed for wheezing.   1 Inhaler   0   . amoxicillin-clavulanate (AUGMENTIN) 875-125 MG per tablet   Oral   Take 1 tablet by mouth 2 (two) times daily.   20 tablet   0   . divalproex (DEPAKOTE) 250 MG DR tablet   Oral   Take 250 mg by mouth daily.          Marland Kitchen  elvitegravir-cobicistat-emtricitabine-tenofovir (STRIBILD) 150-150-200-300 MG TABS tablet   Oral   Take 1 tablet by mouth daily with breakfast.   30 tablet   5   . HYDROcodone-acetaminophen (NORCO/VICODIN) 5-325 MG per tablet   Oral   Take 1 tablet by mouth every 4 (four) hours as needed for pain.   20 tablet   0   . loperamide (IMODIUM) 2 MG capsule   Oral   Take 1 capsule (2 mg total) by mouth 4 (four) times daily as needed for diarrhea or loose stools.   12 capsule   0   . QUEtiapine (SEROQUEL) 200 MG tablet   Oral   Take 200 mg by mouth at bedtime.         . traZODone (DESYREL) 50 MG tablet   Oral   Take 50 mg by mouth at bedtime.           . divalproex (DEPAKOTE)  500 MG DR tablet   Oral   Take 1 tablet (500 mg total) by mouth 2 (two) times daily.   60 tablet   1   . HYDROcodone-acetaminophen (NORCO/VICODIN) 5-325 MG per tablet   Oral   Take 2 tablets by mouth every 4 (four) hours as needed.   8 tablet   0    BP 127/90  Temp(Src) 98.5 F (36.9 C) (Oral)  Resp 17  SpO2 98%  LMP 03/23/2013 Physical Exam  Constitutional: She is oriented to person, place, and time. She appears well-developed and well-nourished. No distress.  HENT:  Head: Normocephalic.    Eyes: Conjunctivae are normal. Pupils are equal, round, and reactive to light. No scleral icterus.  Pupils and reactive fundi benign. Cranial nerves intact symmetric  Neck: Normal range of motion. Neck supple. No thyromegaly present.  Cardiovascular: Normal rate and regular rhythm.  Exam reveals no gallop and no friction rub.   No murmur heard. Pulmonary/Chest: Effort normal and breath sounds normal. No respiratory distress. She has no wheezes. She has no rales.  Clear  lungs. No dyspnea.  Abdominal: Soft. Bowel sounds are normal. She exhibits no distension. There is no tenderness. There is no rebound.  Musculoskeletal: Normal range of motion.  Complains of diffuse pain in the bilateral extremities but exam is normal.  Neurological: She is alert and oriented to person, place, and time.  Normal cranial nerves. Normal peripheral neurological examination.  Skin: Skin is warm and dry. No rash noted.  Psychiatric: She has a normal mood and affect. Her behavior is normal.    ED Course  Procedures (including critical care time) Labs Review Labs Reviewed  COMPREHENSIVE METABOLIC PANEL - Abnormal; Notable for the following:    Sodium 133 (*)    Glucose, Bld 108 (*)    Albumin 3.4 (*)    Total Bilirubin 0.2 (*)    All other components within normal limits  CBC WITH DIFFERENTIAL - Abnormal; Notable for the following:    HCT 35.3 (*)    All other components within normal limits    Imaging Review Dg Chest 1 View  04/24/2013   CLINICAL DATA:  History of asthma, seizures  EXAM: CHEST - 1 VIEW  COMPARISON:  CT thorax 03/30/2013  FINDINGS: Mild cardiomegaly. Vascular pattern normal. Mild scarring left base. Rounded area of opacity measuring about 2.3 cm right upper lobe. This corresponds to known mass seen on the prior study.  IMPRESSION: No active disease. Known lung mass as seen on recent chest CT. Malignancy not excluded.   Electronically Signed  By: Esperanza Heir M.D.   On: 04/24/2013 22:03   Ct Head Wo Contrast  04/24/2013   CLINICAL DATA:  Multiples seizures today, patient hit head on concrete, history of HIV  EXAM: CT HEAD WITHOUT CONTRAST  TECHNIQUE: Contiguous axial images were obtained from the base of the skull through the vertex without intravenous contrast.  COMPARISON:  None.  FINDINGS: No mass lesion. No midline shift. No acute hemorrhage or hematoma. No extra-axial fluid collections. No evidence of acute infarction. Calvarium intact.  IMPRESSION: Negative   Electronically Signed   By: Esperanza Heir M.D.   On: 04/24/2013 21:59    EKG Interpretation   None       MDM   1. Seizure   2. Lung mass   3. Noncompliance with medication regimen    Patient is noncompliant with her Depakote. X-ray shows lung mass previously noted on CT the chest. CT head shows no obvious asymmetry or sign metastasis. No electrolyte abnormalities noted. IV Depakote. States she has the means to refill take her Depakote starting about 36 hours on Monday morning. Given a refill of the medication. Although she states she doesn't have money before Depakote she does request a refill on "my hydrocodone and "she's been taking hydrocodone with prescription. Encouraged her to use her money wisely and it would be in her best interest to invest in her Depakote rather than  hydrocodone.   Roney Marion, MD 04/24/13 2239

## 2013-04-24 NOTE — ED Notes (Signed)
EMS reports pt had 4 seizures within 5 minutes. Unknown how long each seizure lasted. Pt has hx of seizures. Pt unable to speak upon EMS arrival.

## 2013-04-30 ENCOUNTER — Ambulatory Visit (INDEPENDENT_AMBULATORY_CARE_PROVIDER_SITE_OTHER): Payer: Self-pay | Admitting: Internal Medicine

## 2013-04-30 ENCOUNTER — Encounter: Payer: Self-pay | Admitting: Internal Medicine

## 2013-04-30 ENCOUNTER — Ambulatory Visit (INDEPENDENT_AMBULATORY_CARE_PROVIDER_SITE_OTHER)
Admission: RE | Admit: 2013-04-30 | Discharge: 2013-04-30 | Disposition: A | Discharge status: Home / Self Care | Payer: Self-pay | Source: Ambulatory Visit | Attending: Internal Medicine | Admitting: Internal Medicine

## 2013-04-30 VITALS — BP 116/78 | HR 89 | Temp 97.9°F | Ht 61.0 in | Wt 149.0 lb

## 2013-04-30 DIAGNOSIS — R0600 Dyspnea, unspecified: Secondary | ICD-10-CM

## 2013-04-30 DIAGNOSIS — R222 Localized swelling, mass and lump, trunk: Secondary | ICD-10-CM

## 2013-04-30 DIAGNOSIS — F17200 Nicotine dependence, unspecified, uncomplicated: Secondary | ICD-10-CM

## 2013-04-30 DIAGNOSIS — R918 Other nonspecific abnormal finding of lung field: Secondary | ICD-10-CM

## 2013-04-30 DIAGNOSIS — R0609 Other forms of dyspnea: Secondary | ICD-10-CM

## 2013-04-30 MED ORDER — HYDROCODONE-ACETAMINOPHEN 7.5-500 MG PO TABS: 1.0000 | ORAL_TABLET | Freq: Four times a day (QID) | ORAL | Status: DC | PRN

## 2013-04-30 MED ORDER — HYDROCODONE-ACETAMINOPHEN 7.5-325 MG PO TABS: 1.0000 | ORAL_TABLET | Freq: Four times a day (QID) | ORAL | Status: DC | PRN

## 2013-04-30 NOTE — Progress Notes (Signed)
Subjective:    Patient ID: Elizabeth [name], female    DOB: [birth year]  MRN: [id number]   Brief patient profile:  42 yowf HIV since 2008 actively smoking referred to pulmonary clinic 04/16/2013 by Dr comer with sob and cough and RUL density.   04/16/2013 1st [facility] office visit/ Wert  Chief Complaint  Patient presents with  . Pulmonary Consult    Referred per Dr. Valene Bors for eval of abn CT Chest. The pt c/o CP and dyspnea x 5 months. She states that she gets out of breath just walking from one room to the next and wakes up gasping for air. She also c/o prod cough with minimal yellow sputum x 5 months.    cp anterior x onset early Oct 2014 , worse with cough. Sputum never bloody  zpak helped some  saba helps doe some  Poor dentition and having active Seizures since July 2013 (denies ever seeing neuorlogist as outpt) rec The key is to stop smoking completely before smoking completely stops you!  Augmentin 875 mg take one pill twice daily  X 10 days       04/30/2013 f/u ov/Wert still smoking  re: RUL density  Chief Complaint  Patient presents with  . Follow-up    Pt c/o SOB constantly, waking her up at night.  Pt went to ED Saturday for seizures. Pt feels worse now than at her lv 2wks ago.   Hurt worse since ran out of pain meds but was needing to up to 2 every 4 hours until 3 days prior to OV  And no better p augmentin. Mucus remains dark but not bloody. Since ov had multiple sz's but not seeing in neurologist Doe and at rest not better p saba Pain / sob constant now 24/7 and generalized anterior on R, localized on R post below scapula tip  No obvious day to day or daytime variabilty or assoc  chest tightness, subjective wheeze overt sinus or hb symptoms. No unusual exp hx or h/o childhood pna/ asthma or knowledge of premature birth.    Also denies any obvious fluctuation of symptoms with weather or environmental changes or other aggravating or alleviating factors except  as outlined above   Current Medications, Allergies, Complete Past Medical History, Past Surgical History, Family History, and Social History were reviewed in [employer] record.  ROS  The following are not active complaints unless bolded sore throat, dysphagia, dental problems, itching, sneezing,  nasal congestion or excess/ purulent secretions, ear ache,   fever, chills, sweats, unintended wt loss, pleuritic or exertional cp, hemoptysis,  orthopnea pnd or leg swelling, presyncope, palpitations, heartburn, abdominal pain, anorexia, nausea, vomiting, diarrhea  or change in bowel or urinary habits, change in stools or urine, dysuria,hematuria,  rash, arthralgias, visual complaints, headache, numbness weakness or ataxia or problems with walking or coordination,  change in mood/affect or memory.                  Objective:   Physical Exam   Anxious wf nad  04/30/2013      149 Wt Readings from Last 3 Encounters:  04/16/13 149 lb (67.586 kg)  04/13/13 147 lb (66.679 kg)  05/30/12 115 lb (52.164 kg)     HEENT mild turbinate edema.  Oropharynx no thrush or excess pnd or cobblestoning.  No JVD or cervical adenopathy. Mild accessory muscle hypertrophy. Trachea midline, nl thryroid. Chest was hyperinflated by percussion with diminished breath sounds and moderate increased exp time  without wheeze. Hoover sign positive at mid inspiration. Regular rate and rhythm without murmur gallop or rub or increase P2 or edema.  Abd: no hsm, nl excursion. Ext warm without cyanosis or clubbing.  No rash or cw tenderness   CT chest 03/30/13 24 x 15 x 18 mm mildly enhancing right upper lobe pulmonary mass  with indistinct, spiculated margins, and this may reflect primary  lung neoplasm, possibly pneumonia    CXR  04/30/2013 :  1. Stable mass right upper lobe.  2. No acute cardiopulmonary disease. Chest stable from 06/24/2012.  Assessment & Plan:

## 2013-04-30 NOTE — Assessment & Plan Note (Signed)
>   3min  I took an extended  opportunity with this patient to outline the consequences of continued cigarette use  in airway disorders based on all the data we have from the multiple national lung health studies (perfomed over decades at millions of dollars in cost)  indicating that smoking cessation, not choice of inhalers or physicians, is the most important aspect of her care.   

## 2013-04-30 NOTE — Assessment & Plan Note (Addendum)
Still feel most likely this is abscess related to sz and poor dention   but absence of purulent or putrid sptum and smoking/ hiv status raise issue of fungal or other granulmatous process and lung ca so rec fob before more empirical abx  Discussed in detail all the  indications, usual  risks and alternatives  relative to the benefits with patient who agrees to proceed with bronchoscopy with biopsy Dec 3

## 2013-04-30 NOTE — Assessment & Plan Note (Signed)
-   04/30/2013  Walked RA  2 laps @ 185 ft each stopped due to  Sob with sats of 99%  Not able to reproduce her "constant" sense of sob here in office even with exercise and suspect this is related to anxiety and w/d from chronic narcotic rx  > restart hyodrocone 7.5 q 4 h prn

## 2013-04-30 NOTE — Patient Instructions (Addendum)
715 am Dec 3 Wednesday outpatient entrance at [facility] (behind to ER) and nothing to eat or drink after midnight on Tuesday for Bronchoscopy  For pain in meantime ok to take hydrocodone with tylenol one every 4 hours - works for cough also  The key is to stop smoking completely before smoking completely stops you!   We will arrange further follow up once we get the results back on Thursday the 4th

## 2013-05-04 ENCOUNTER — Encounter (HOSPITAL_COMMUNITY): Payer: Self-pay

## 2013-05-05 ENCOUNTER — Ambulatory Visit (HOSPITAL_COMMUNITY)
Admission: RE | Admit: 2013-05-05 | Discharge: 2013-05-05 | Disposition: A | Discharge status: Home / Self Care | Payer: No Typology Code available for payment source | Source: Ambulatory Visit | Attending: Internal Medicine | Admitting: Internal Medicine

## 2013-05-05 ENCOUNTER — Encounter (HOSPITAL_COMMUNITY)
Admission: RE | Disposition: A | Discharge status: Home / Self Care | Payer: No Typology Code available for payment source | Source: Ambulatory Visit | Attending: Internal Medicine

## 2013-05-05 ENCOUNTER — Ambulatory Visit (HOSPITAL_COMMUNITY)
Admission: RE | Admit: 2013-05-05 | Discharge: 2013-05-05 | Disposition: A | Discharge status: Home / Self Care | Payer: Self-pay | Source: Ambulatory Visit | Attending: Internal Medicine | Admitting: Internal Medicine

## 2013-05-05 ENCOUNTER — Encounter (INDEPENDENT_AMBULATORY_CARE_PROVIDER_SITE_OTHER): Payer: Self-pay

## 2013-05-05 ENCOUNTER — Ambulatory Visit (HOSPITAL_COMMUNITY): Payer: No Typology Code available for payment source

## 2013-05-05 DIAGNOSIS — R0989 Other specified symptoms and signs involving the circulatory and respiratory systems: Secondary | ICD-10-CM | POA: Insufficient documentation

## 2013-05-05 DIAGNOSIS — Z21 Asymptomatic human immunodeficiency virus [HIV] infection status: Secondary | ICD-10-CM | POA: Insufficient documentation

## 2013-05-05 DIAGNOSIS — R0602 Shortness of breath: Secondary | ICD-10-CM | POA: Insufficient documentation

## 2013-05-05 DIAGNOSIS — R05 Cough: Secondary | ICD-10-CM | POA: Insufficient documentation

## 2013-05-05 DIAGNOSIS — R911 Solitary pulmonary nodule: Secondary | ICD-10-CM | POA: Insufficient documentation

## 2013-05-05 DIAGNOSIS — R059 Cough, unspecified: Secondary | ICD-10-CM | POA: Insufficient documentation

## 2013-05-05 DIAGNOSIS — F17200 Nicotine dependence, unspecified, uncomplicated: Secondary | ICD-10-CM | POA: Insufficient documentation

## 2013-05-05 DIAGNOSIS — R569 Unspecified convulsions: Secondary | ICD-10-CM | POA: Insufficient documentation

## 2013-05-05 DIAGNOSIS — R0609 Other forms of dyspnea: Secondary | ICD-10-CM | POA: Insufficient documentation

## 2013-05-05 DIAGNOSIS — R918 Other nonspecific abnormal finding of lung field: Secondary | ICD-10-CM

## 2013-05-05 DIAGNOSIS — R222 Localized swelling, mass and lump, trunk: Secondary | ICD-10-CM | POA: Insufficient documentation

## 2013-05-05 HISTORY — PX: VIDEO BRONCHOSCOPY: SHX5072

## 2013-05-05 SURGERY — BRONCHOSCOPY, WITH FLUOROSCOPY
Anesthesia: Moderate Sedation | Laterality: Bilateral

## 2013-05-05 MED ORDER — MEPERIDINE HCL 25 MG/ML IJ SOLN
INTRAMUSCULAR | Status: DC | PRN
  Administered 2013-05-05 (×2): 50 mg via INTRAVENOUS

## 2013-05-05 MED ORDER — LIDOCAINE HCL (PF) 1 % IJ SOLN
INTRAMUSCULAR | Status: DC | PRN
  Administered 2013-05-05 (×2): 6 mL

## 2013-05-05 MED ORDER — LIDOCAINE HCL 2 % EX GEL
CUTANEOUS | Status: DC | PRN
  Administered 2013-05-05: 1

## 2013-05-05 MED ORDER — PHENYLEPHRINE HCL 0.25 % NA SOLN
NASAL | Status: DC | PRN
  Administered 2013-05-05: 2 via NASAL

## 2013-05-05 MED ORDER — MIDAZOLAM HCL 10 MG/2ML IJ SOLN
INTRAMUSCULAR | Status: DC | PRN
  Administered 2013-05-05 (×2): 5 mg via INTRAVENOUS

## 2013-05-05 MED ORDER — MEPERIDINE HCL 100 MG/ML IJ SOLN
INTRAMUSCULAR | Status: AC
  Filled 2013-05-05: qty 2

## 2013-05-05 MED ORDER — MIDAZOLAM HCL 10 MG/2ML IJ SOLN
INTRAMUSCULAR | Status: AC
  Filled 2013-05-05: qty 4

## 2013-05-05 NOTE — Op Note (Signed)
Bronchoscopy Procedure Note  Date of Operation: 05/05/2013   Pre-op Diagnosis: HIV pt with lung nodule  Post-op Diagnosis: same  Surgeon: Sandrea Hughs  Anesthesia: Monitored Local Anesthesia with Sedation  Operation: Video Flexible fiberoptic bronchoscopy, diagnostic   Findings: airways nl  Specimen: BAL RUL,  TBBX x 3 RUL   Estimated Blood Loss: 50 cc  Complications: bleeding p 3rd bx   Indications and History: See updated H and P same date. The risks, benefits, complications, treatment options and expected outcomes were discussed with the patient.  The possibilities of reaction to medication, pulmonary aspiration, perforation of a viscus, bleeding, failure to diagnose a condition and creating a complication requiring transfusion or operation were discussed with the patient who freely signed the consent.    Description of Procedure: The patient was re-examined in the bronchoscopy suite and the site of surgery properly noted/marked.  The patient was identified  and the procedure verified as Flexible Fiberoptic Bronchoscopy.  A Time Out was held and the above information confirmed.   After the induction of topical nasopharyngeal anesthesia, the patient was positioned  and the bronchoscope was passed through the R naris. The vocal cords were visualized and  1% buffered lidocaine 5 ml was topically placed onto the cords. The cords were nl. The scope was then passed into the trachea.  1% buffered lidocaine given topically. Airways inspected bilaterally to the subsegmental level with the following findings:  All airways were widely patent and nl   Procedures: 1) BAL RUL for cytology, afb and fungal smears 2) TBBx x 3 RUL  Pt had significant bloody return after 3rd bx so held wedge position until eased and placed in R lat decub position and f/u pcxr: "1. Right upper lobe airspace opacity around the biopsied mass, favoring pulmonary hemorrhage. No pneumothorax observed."   The Patient  was taken to the Endoscopy Recovery area in satisfactory condition. Family notified re bleeding and to call if > 1/2 cup noted but gross hemoptysis had stopped w/in 15 min of bx  Attestation: I performed the procedure.  Sandrea Hughs, MD Pulmonary and Critical Care Medicine [facility] Cell [phone] After 5:30 PM or weekends, call [phone]

## 2013-05-05 NOTE — Progress Notes (Signed)
Video bronchoscopy procedure performed. Bronchial washings intervention performed. Biopsy intervention performed.  

## 2013-05-05 NOTE — H&P (Signed)
Brief patient profile:  42 yowf HIV since 2008 actively smoking referred to pulmonary clinic 04/16/2013 by Dr comer with sob and cough and RUL density.     04/16/2013 1st [facility] office visit/ Elizabeth [name]   Chief Complaint   Patient presents with   .  Pulmonary Consult       Referred per Dr. Valene Bors for eval of abn CT Chest. The pt c/o CP and dyspnea x 5 months. She states that she gets out of breath just walking from one room to the next and wakes up gasping for air. She also c/o prod cough with minimal yellow sputum x 5 months.      cp anterior x onset early Oct 2014 , worse with cough. Sputum never bloody   zpak helped some   saba helps doe some   Poor dentition and having active Seizures since July 2013 (denies ever seeing neuorlogist as outpt) rec The key is to stop smoking completely before smoking completely stops you!   Augmentin 875 mg take one pill twice daily  X 10 days           04/30/2013 f/u ov/Elizabeth [name] still smoking  re: RUL density   Chief Complaint   Patient presents with   .  Follow-up       Pt c/o SOB constantly, waking her up at night.  Pt went to ED Saturday for seizures. Pt feels worse now than at her lv 2wks ago.     Hurt worse since ran out of pain meds but was needing to up to 2 every 4 hours until 3 days prior to OV  And no better p augmentin. Mucus remains dark but not bloody. Since ov had multiple sz's but not seeing in neurologist [place] and at rest not better p saba Pain / sob constant now 24/7 and generalized anterior on R, localized on R post below scapula tip   No obvious day to day or daytime variabilty or assoc  chest tightness, subjective wheeze overt sinus or hb symptoms. No unusual exp hx or h/o childhood pna/ asthma or knowledge of premature birth.     Also denies any obvious fluctuation of symptoms with weather or environmental changes or other aggravating or alleviating factors except as outlined above    Current Medications,  Allergies, Complete Past Medical History, Past Surgical History, Family History, and Social History were reviewed in [employer] record.   ROS  The following are not active complaints unless bolded sore throat, dysphagia, dental problems, itching, sneezing,  nasal congestion or excess/ purulent secretions, ear ache,   fever, chills, sweats, unintended wt loss,   exertional cp, hemoptysis,  orthopnea pnd or leg swelling, presyncope, palpitations, heartburn, abdominal pain, anorexia, nausea, vomiting, diarrhea  or change in bowel or urinary habits, change in stools or urine, dysuria,hematuria,  rash, arthralgias, visual complaints, headache, numbness weakness or ataxia or problems with walking or coordination,  change in mood/affect or memory.                           Objective:     Physical Exam     Anxious wf nad   04/30/2013      149 Wt Readings from Last 3 Encounters:   04/16/13  149 lb (67.586 kg)   04/13/13  147 lb (66.679 kg)   05/30/12  115 lb (52.164 kg)        HEENT mild turbinate  edema.  Oropharynx no thrush or excess pnd or cobblestoning.  No JVD or cervical adenopathy. Mild accessory muscle hypertrophy. Trachea midline, nl thryroid. Chest was hyperinflated by percussion with diminished breath sounds and moderate increased exp time without wheeze. Hoover sign positive at mid inspiration. Regular rate and rhythm without murmur gallop or rub or increase P2 or edema.  Abd: no hsm, nl excursion. Ext warm without cyanosis or clubbing.   No rash or cw tenderness     CT chest 03/30/13 24 x 15 x 18 mm mildly enhancing right upper lobe pulmonary mass   with indistinct, spiculated margins, and this may reflect primary   lung neoplasm, possibly pneumonia      CXR  04/30/2013 :  1. Stable mass right upper lobe.   2. No acute cardiopulmonary disease. Chest stable from 06/24/2012.    Assessment & Plan:     Pulmonary mass -      Still  feel most likely this is abscess related to sz and poor dention   but absence of purulent or putrid sptum and smoking/ hiv status raise issue of fungal or other granulmatous process and lung ca so rec fob before more empirical abx   Discussed in detail all the  indications, usual  risks and alternatives  relative to the benefits with patient who agrees to proceed with bronchoscopy with biopsy Dec 3    SMOKER     > 3 min I took an extended  opportunity with this patient to outline the consequences of continued cigarette use  in airway disorders based on all the data we have from the multiple national lung health studies (perfomed over decades at millions of dollars in cost)  indicating that smoking cessation, not choice of inhalers or physicians, is the most important aspect of her care.            Dyspnea -     - 04/30/2013  Walked RA  2 laps @ 185 ft each stopped due to  Sob with sats of 99%   Not able to reproduce her "constant" sense of sob here in office even with exercise and suspect this is related to anxiety and w/d from chronic narcotic rx  > restart hyodrocone 7.5 q 4 h prn              Disp Refills Start End      HYDROcodone-acetaminophen (LORTAB 7.5) 7.5-500 MG per tablet 40 tablet 0 04/30/2013        Take 1 tablet by mouth every 6 (six) hours as needed for pain. - Oral      HYDROcodone-acetaminophen (NORCO) 7.5-325 MG per tablet 40 tablet 0 04/30/2013        Take 1 tablet by mouth every 6 (six) hours as needed. - Oral              Discontinued Medications        Reason for Discontinue      amoxicillin-clavulanate (AUGMENTIN) 875-125 MG per tablet Completed Course      divalproex (DEPAKOTE ER) 500 MG 24 hr tablet Change in therapy      elvitegravir-cobicistat-emtricitabine-tenofovir (STRIBILD) 150-150-200-300 MG TABS tablet        HYDROcodone-acetaminophen (NORCO/VICODIN) 5-325 MG per tablet           Patient Instructions      715 am Dec 3 Wednesday outpatient entrance  at [facility] (behind to ER) and nothing to eat or drink after midnight on Tuesday for Bronchoscopy  For pain in meantime ok to take hydrocodone with tylenol one every 4 hours - works for cough also   The key is to stop smoking completely before smoking completely stops you!      05/05/2013 FOB no change in hx or exam   Sandrea Hughs, MD Pulmonary and Critical Care Medicine [facility] Cell [phone] After 5:30 PM or weekends, call [phone]

## 2013-05-06 ENCOUNTER — Ambulatory Visit (INDEPENDENT_AMBULATORY_CARE_PROVIDER_SITE_OTHER): Payer: Self-pay | Admitting: Internal Medicine

## 2013-05-06 ENCOUNTER — Telehealth: Payer: Self-pay | Admitting: Internal Medicine

## 2013-05-06 ENCOUNTER — Ambulatory Visit: Payer: Self-pay | Admitting: Internal Medicine

## 2013-05-06 ENCOUNTER — Encounter (HOSPITAL_COMMUNITY): Payer: Self-pay | Admitting: Internal Medicine

## 2013-05-06 VITALS — BP 118/62 | HR 90 | Temp 98.3°F | Ht 61.0 in | Wt 149.8 lb

## 2013-05-06 DIAGNOSIS — R042 Hemoptysis: Secondary | ICD-10-CM

## 2013-05-06 DIAGNOSIS — F17200 Nicotine dependence, unspecified, uncomplicated: Secondary | ICD-10-CM

## 2013-05-06 DIAGNOSIS — R918 Other nonspecific abnormal finding of lung field: Secondary | ICD-10-CM

## 2013-05-06 DIAGNOSIS — R222 Localized swelling, mass and lump, trunk: Secondary | ICD-10-CM

## 2013-05-06 MED ORDER — HYDROCODONE-ACETAMINOPHEN 7.5-325 MG PO TABS: 1.0000 | ORAL_TABLET | Freq: Four times a day (QID) | ORAL | Status: DC | PRN

## 2013-05-06 MED ORDER — PROMETHAZINE HCL 12.5 MG PO TABS: ORAL_TABLET | ORAL | Status: DC

## 2013-05-06 NOTE — Patient Instructions (Signed)
Keep taking the hydrocodone up to 2 every 6 hours for pain or cough  For nausea phenergan  12.5 mg 1-2 every 6 hours as needed   Soups, broth based first, then add noodles and crackers  Last = veggies/ salads/ dairy products  We will arrange for PET scan next

## 2013-05-06 NOTE — Telephone Encounter (Signed)
I called and spoke with pt sister. She reports pt is coughing up blood every 30 min and is bright red. She is not able to come in until this afternoon. Appt scheduled with mw. NOTHING FURTHER NEEDED

## 2013-05-08 ENCOUNTER — Encounter: Payer: Self-pay | Admitting: Internal Medicine

## 2013-05-08 DIAGNOSIS — R042 Hemoptysis: Secondary | ICD-10-CM | POA: Insufficient documentation

## 2013-05-08 NOTE — Assessment & Plan Note (Signed)
-   rx augmentin 04/16/13 x 10 days > no better at all - FOB 05/05/2013 > nl airways, BAL and tbbx x 3 > antracosis, no atypia, neg afb and fungal smears   Discussed in detail all the  indications, usual  risks and alternatives  relative to the benefits with patient who agrees to proceed with pet next

## 2013-05-08 NOTE — Assessment & Plan Note (Signed)

## 2013-05-08 NOTE — Assessment & Plan Note (Addendum)
Amt is tapering off, rx with cough suppression with hydrocodone and rest.

## 2013-05-08 NOTE — Progress Notes (Signed)
Subjective:    Patient ID: Elizabeth [name], female    DOB: [birth year]  MRN: [id number]   Brief patient profile:  42 yowf HIV since 2008 actively smoking referred to pulmonary clinic 04/16/2013 by Dr comer with sob and cough and RUL density.   04/16/2013 1st [facility] office visit/ Wert  Chief Complaint  Patient presents with  . Pulmonary Consult    Referred per Dr. Valene Bors for eval of abn CT Chest. The pt c/o CP and dyspnea x 5 months. She states that she gets out of breath just walking from one room to the next and wakes up gasping for air. She also c/o prod cough with minimal yellow sputum x 5 months.    cp anterior x onset early Oct 2014 , worse with cough. Sputum never bloody  zpak helped some  saba helps doe some  Poor dentition and having active Seizures since July 2013 (denies ever seeing neuorlogist as outpt) rec The key is to stop smoking completely before smoking completely stops you!  Augmentin 875 mg take one pill twice daily  X 10 days       04/30/2013 f/u ov/Wert still smoking  re: RUL density  Chief Complaint  Patient presents with  . Follow-up    Pt c/o SOB constantly, waking her up at night.  Pt went to ED Saturday for seizures. Pt feels worse now than at her lv 2wks ago.   Hurt worse since ran out of pain meds but was needing to up to 2 every 4 hours until 3 days prior to OV  And no better p augmentin. Mucus remains dark but not bloody. Since ov had multiple sz's but not seeing in neurologist [place] and at rest not better p saba Pain / sob constant now 24/7 and generalized anterior on R, localized on R post below scapula tip rec Fob > done 05/05/13 with hemoptysis immediately p procedure   05/08/2013 acute  ov/Wert re: hemoptysis/ gagging from coughing  Chief Complaint  Patient presents with  . Acute Visit    pt c/o nausea, cannot eat anything. Pt also c/o coughing up blood since procedure. I advised the pt that this is normal, but triage advised her to  come in for OV.    no sob at rest Amt of brb is tapering off to less than a tsp on day of ov - total amt about a quarter cup mixed with saliva.  No obvious day to day or daytime variabilty or assoc  chest tightness, subjective wheeze overt sinus or hb symptoms. No unusual exp hx or h/o childhood pna/ asthma or knowledge of premature birth.    Also denies any obvious fluctuation of symptoms with weather or environmental changes or other aggravating or alleviating factors except as outlined above   Current Medications, Allergies, Complete Past Medical History, Past Surgical History, Family History, and Social History were reviewed in [employer] record.  ROS  The following are not active complaints unless bolded sore throat, dysphagia, dental problems, itching, sneezing,  nasal congestion or excess/ purulent secretions, ear ache,   fever, chills, sweats, unintended wt loss, pleuritic or exertional cp, hemoptysis,  orthopnea pnd or leg swelling, presyncope, palpitations, heartburn, abdominal pain, anorexia, nausea, vomiting, diarrhea  or change in bowel or urinary habits, change in stools or urine, dysuria,hematuria,  rash, arthralgias, visual complaints, headache, numbness weakness or ataxia or problems with walking or coordination,  change in mood/affect or memory.  Objective:   Physical Exam   Anxious wf nad  Wt Readings from Last 3 Encounters:  05/06/13 149 lb 12.8 oz (67.949 kg)  04/30/13 149 lb (67.586 kg)  04/16/13 149 lb (67.586 kg)        HEENT mild turbinate edema.  Oropharynx no thrush or excess pnd or cobblestoning.  No JVD or cervical adenopathy. Mild accessory muscle hypertrophy. Trachea midline, nl thryroid. Chest was hyperinflated by percussion with diminished breath sounds and moderate increased exp time without wheeze. Hoover sign positive at mid inspiration. Regular rate and rhythm without murmur gallop or rub or increase P2 or  edema.  Abd: no hsm, nl excursion. Ext warm without cyanosis or clubbing.  No rash or cw tenderness   CT chest 03/30/13 24 x 15 x 18 mm mildly enhancing right upper lobe pulmonary mass  with indistinct, spiculated margins, and this may reflect primary  lung neoplasm, possibly pneumonia    CXR   05/05/13  1. Right upper lobe airspace opacity around the biopsied mass,  favoring pulmonary hemorrhage. No pneumothorax observed.    Assessment & Plan:

## 2013-05-10 ENCOUNTER — Other Ambulatory Visit: Payer: Self-pay | Admitting: *Deleted

## 2013-05-10 DIAGNOSIS — B20 Human immunodeficiency virus [HIV] disease: Secondary | ICD-10-CM

## 2013-05-10 MED ORDER — ELVITEG-COBIC-EMTRICIT-TENOFDF 150-150-200-300 MG PO TABS: 1.0000 | ORAL_TABLET | Freq: Every day | ORAL | Status: DC

## 2013-05-11 ENCOUNTER — Telehealth: Payer: Self-pay | Admitting: Internal Medicine

## 2013-05-11 ENCOUNTER — Emergency Department (HOSPITAL_COMMUNITY): Payer: No Typology Code available for payment source

## 2013-05-11 ENCOUNTER — Encounter: Payer: Self-pay | Admitting: Internal Medicine

## 2013-05-11 ENCOUNTER — Telehealth: Payer: Self-pay | Admitting: Pulmonary Disease

## 2013-05-11 ENCOUNTER — Encounter (HOSPITAL_COMMUNITY): Payer: Self-pay | Admitting: Emergency Medicine

## 2013-05-11 ENCOUNTER — Emergency Department (HOSPITAL_COMMUNITY)
Admission: EM | Admit: 2013-05-11 | Discharge: 2013-05-11 | Disposition: A | Discharge status: Home / Self Care | Payer: No Typology Code available for payment source | Attending: Emergency Medicine | Admitting: Emergency Medicine

## 2013-05-11 DIAGNOSIS — Z79899 Other long term (current) drug therapy: Secondary | ICD-10-CM | POA: Insufficient documentation

## 2013-05-11 DIAGNOSIS — R911 Solitary pulmonary nodule: Secondary | ICD-10-CM

## 2013-05-11 DIAGNOSIS — Z21 Asymptomatic human immunodeficiency virus [HIV] infection status: Secondary | ICD-10-CM | POA: Insufficient documentation

## 2013-05-11 DIAGNOSIS — F17200 Nicotine dependence, unspecified, uncomplicated: Secondary | ICD-10-CM | POA: Insufficient documentation

## 2013-05-11 DIAGNOSIS — R52 Pain, unspecified: Secondary | ICD-10-CM

## 2013-05-11 DIAGNOSIS — R0789 Other chest pain: Secondary | ICD-10-CM | POA: Insufficient documentation

## 2013-05-11 DIAGNOSIS — Z3202 Encounter for pregnancy test, result negative: Secondary | ICD-10-CM | POA: Insufficient documentation

## 2013-05-11 DIAGNOSIS — G40909 Epilepsy, unspecified, not intractable, without status epilepticus: Secondary | ICD-10-CM | POA: Insufficient documentation

## 2013-05-11 DIAGNOSIS — J45901 Unspecified asthma with (acute) exacerbation: Secondary | ICD-10-CM | POA: Insufficient documentation

## 2013-05-11 LAB — BASIC METABOLIC PANEL
CO2: 25 mEq/L (ref 19–32)
Calcium: 8.8 mg/dL (ref 8.4–10.5)
Chloride: 99 mEq/L (ref 96–112)
Creatinine, Ser: 0.68 mg/dL (ref 0.50–1.10)
GFR calc Af Amer: >90 mL/min (ref >90)
Glucose, Bld: 103 mg/dL — ABNORMAL HIGH (ref 70–99)
Sodium: 133 mEq/L — ABNORMAL LOW (ref 135–145)

## 2013-05-11 LAB — CBC WITH DIFFERENTIAL/PLATELET
Basophils Absolute: 0 10*3/uL (ref 0.0–0.1)
Basophils Relative: 0 % (ref 0–1)
Lymphocytes Relative: 39 % (ref 12–46)
MCHC: 36.3 g/dL — ABNORMAL HIGH (ref 30.0–36.0)
Monocytes Absolute: 0.4 10*3/uL (ref 0.1–1.0)
Neutro Abs: 2.4 10*3/uL (ref 1.7–7.7)
Neutrophils Relative %: 47 % (ref 43–77)
Platelets: 300 10*3/uL (ref 150–400)
RDW: 13.1 % (ref 11.5–15.5)
WBC: 5.1 10*3/uL (ref 4.0–10.5)

## 2013-05-11 LAB — POCT PREGNANCY, URINE: Preg Test, Ur: NEGATIVE

## 2013-05-11 MED ORDER — MORPHINE SULFATE 4 MG/ML IJ SOLN
4.0000 mg | Freq: Once | INTRAMUSCULAR | Status: AC
  Administered 2013-05-11: 4 mg via INTRAVENOUS
  Filled 2013-05-11: qty 1

## 2013-05-11 MED ORDER — OXYCODONE-ACETAMINOPHEN 5-325 MG PO TABS: 1.0000 | ORAL_TABLET | Freq: Four times a day (QID) | ORAL | Status: DC | PRN

## 2013-05-11 MED ORDER — OXYCODONE-ACETAMINOPHEN 5-325 MG PO TABS
1.0000 | ORAL_TABLET | Freq: Once | ORAL | Status: AC
  Administered 2013-05-11: 1 via ORAL
  Filled 2013-05-11: qty 1

## 2013-05-11 MED ORDER — ONDANSETRON HCL 4 MG/2ML IJ SOLN
4.0000 mg | Freq: Once | INTRAMUSCULAR | Status: AC
  Administered 2013-05-11: 4 mg via INTRAVENOUS
  Filled 2013-05-11: qty 2

## 2013-05-11 MED ORDER — HYDROCODONE-ACETAMINOPHEN 7.5-325 MG PO TABS: 1.0000 | ORAL_TABLET | Freq: Four times a day (QID) | ORAL | Status: DC | PRN

## 2013-05-11 MED ORDER — OXYCODONE-ACETAMINOPHEN 5-325 MG PO TABS: 1.0000 | ORAL_TABLET | Freq: Once | ORAL | Status: AC

## 2013-05-11 MED ORDER — IOHEXOL 300 MG/ML  SOLN
100.0000 mL | Freq: Once | INTRAMUSCULAR | Status: AC | PRN
  Administered 2013-05-11: 80 mL via INTRAVENOUS

## 2013-05-11 NOTE — ED Notes (Signed)
Patient transported to X-ray 

## 2013-05-11 NOTE — Telephone Encounter (Signed)
Pt had recent bronchoscopy.  She presented to ER with chest pain >> controlled with analgesic medications in ED.  CT chest done in ED: 05/11/2013    CLINICAL DATA:  42 year old female with recent lung mass biopsy 5 days ago. Continued pain. Initial encounter. History of HIV.   EXAM: CT CHEST WITH CONTRAST   TECHNIQUE: Multidetector CT imaging of the chest was performed during intravenous contrast administration.   CONTRAST:  80mL OMNIPAQUE IOHEXOL 300 MG/ML  SOLN   COMPARISON:  Chest radiographs 1932 hr the same day and earlier.   FINDINGS:  Posterior right upper lobe mostly solid appearing lung mass with irregular spiculated margins has not significantly changed in size or configuration since 03/30/2013, measuring 24 x 16 mm trans axially.  No pneumothorax. There is new patchy ground-glass opacity in the right apex, anterior to the mass (series 7, image 17, to a lesser extent image 13). At the same time there is new peripheral and peribronchovascular nodular opacity in the anterior right lung on image 23. Similar new findings in the lateral segment of the right middle lobe on image 35. Right lower lobe markings appear stable since October.  Major airways are patent in both lungs. There is subtle increased peripheral nodularity in the superior segment of the left lower lobe on image 31. Otherwise, left lung parenchyma is stable.  Stable small volume residual thymus. Visible thoracic inlet remains negative. Subcentimeter hilar and mediastinal lymph nodes are stable. Visualized aorta and other major mediastinal vascular structures appear within normal limits. No pericardial effusion. No pleural effusion.  Negative visualized upper abdominal viscera. Stable small bilateral axillary lymph nodes, somewhat increased in number.  No acute osseous abnormality identified.   IMPRESSION:  1. Dominant right upper lobe masslike opacity is stable since 03/29/2013. Correlate with biopsy results, but infectious etiology  of this lesion is now felt unlikely.   2. No pneumothorax or pleural effusion, but there is new multilobar bilateral peri-bronchovascular and peripheral lung nodularity in keeping with a superimposed multilobar pneumonia.   3. Stable mediastinum with no lymphadenopathy. 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    Electronically Signed   By: Augusto Gamble M.D.   On: 05/11/2013 21:18   D/w Dr. Wilkie Aye.  No clinical suspicion for infection at this time >> advised to have pt follow up in pulmonary office.  Will route to Dr. Sherene Sires for review.

## 2013-05-11 NOTE — ED Provider Notes (Signed)
CSN: [id number]     Arrival date & time 05/11/13  1827 History   First MD Initiated Contact with Patient 05/11/13 1849     Chief Complaint  Patient presents with  . Lung Mass   (Consider location/radiation/quality/duration/timing/severity/associated sxs/prior Treatment) HPI  This a 42 year old female with history of HIV, substance abuse, and recent lung mass who presents with right chest pain.  Patient reports having a biopsy approximately 6 days ago. She states that she was doing well and had pain relief with hydrocodone. However, the last one to 2 days she's had increasing pain. She states "I feel like my chest is going to explode." She denies pleuritic nature to the pain. She denies diaphoresis or exertional component.  She states that the hydrocodone is not helping now. She does report a small amount of hematemesis which had improved but states that she had recurrence today of a small amount of blood in her sputum. She states that she is in such bad pain that she is unable to smoke. Rates her pain at 9/10.   Past Medical History  Diagnosis Date  . HIV (human immunodeficiency virus infection)   . Seizures   . Substance abuse   . Asthma    Past Surgical History  Procedure Laterality Date  . Cesarean section      x 3  . Video bronchoscopy Bilateral 05/05/2013    Procedure: VIDEO BRONCHOSCOPY WITH FLUORO;  Surgeon: Nyoka Cowden, MD;  Location: [facility];  Service: Cardiopulmonary;  Laterality: Bilateral;   Family History  Problem Relation Age of Onset  . Diabetes Mother   . CAD Mother   . Hypertension Mother   . Hypertension Sister   . Lung cancer Maternal Aunt     smoked  . Allergies Sister   . Asthma Sister   . Lung cancer Maternal Uncle     never smoker  . Lung cancer Maternal Grandmother     never smoker   History  Substance Use Topics  . Smoking status: Current Every Day Smoker -- 0.50 packs/day for 23 years    Types: Cigarettes  . Smokeless tobacco: Never Used   . Alcohol Use: No   OB History   Grav Para Term Preterm Abortions TAB SAB Ect Mult Living                 Review of Systems  Constitutional: Negative for fever.  Respiratory: Positive for cough, chest tightness and shortness of breath.   Cardiovascular: Positive for chest pain.  Gastrointestinal: Negative for nausea, vomiting and abdominal pain.  Musculoskeletal: Negative for back pain.  Neurological: Negative for headaches.  All other systems reviewed and are negative.    Allergies  Gabapentin  Home Medications   Current Outpatient Rx  Name  Route  Sig  Dispense  Refill  . acetaminophen (TYLENOL) 500 MG tablet   Oral   Take 500 mg by mouth every 6 (six) hours as needed (pain).         Marland Kitchen albuterol (PROVENTIL HFA;VENTOLIN HFA) 108 (90 BASE) MCG/ACT inhaler   Inhalation   Inhale 2 puffs into the lungs every 4 (four) hours as needed for wheezing.   1 Inhaler   0   . divalproex (DEPAKOTE) 250 MG DR tablet   Oral   Take 250 mg by mouth daily.          Marland Kitchen elvitegravir-cobicistat-emtricitabine-tenofovir (STRIBILD) 150-150-200-300 MG TABS tablet   Oral   Take 1 tablet by mouth daily with breakfast.  30 tablet   6   . HYDROcodone-acetaminophen (NORCO) 7.5-325 MG per tablet   Oral   Take 1 tablet by mouth every 6 (six) hours as needed.   60 tablet   0   . loperamide (IMODIUM) 2 MG capsule   Oral   Take 1 capsule (2 mg total) by mouth 4 (four) times daily as needed for diarrhea or loose stools.   12 capsule   0   . promethazine (PHENERGAN) 12.5 MG tablet      1-2 every 6 hours prn nausea   20 tablet   0   . traZODone (DESYREL) 50 MG tablet   Oral   Take 50 mg by mouth at bedtime.           Marland Kitchen oxyCODONE-acetaminophen (PERCOCET/ROXICET) 5-325 MG per tablet   Oral   Take 1-2 tablets by mouth every 6 (six) hours as needed for severe pain.   10 tablet   0    BP 124/84  Pulse 95  Temp(Src) 98.2 F (36.8 C) (Oral)  Resp 14  SpO2 96%  LMP  05/11/2013 Physical Exam  Nursing note and vitals reviewed. Constitutional: She is oriented to person, place, and time. She appears well-developed and well-nourished. No distress.  HENT:  Head: Normocephalic and atraumatic.  Eyes: Pupils are equal, round, and reactive to light.  Neck: Neck supple.  Cardiovascular: Normal rate, regular rhythm and normal heart sounds.   No murmur heard. Pulmonary/Chest: Effort normal and breath sounds normal. No respiratory distress.  Scant expiratory wheezing, no respiratory distress noted  Abdominal: Soft. Bowel sounds are normal. There is no tenderness.  Musculoskeletal: She exhibits no edema.  Neurological: She is alert and oriented to person, place, and time.  Skin: Skin is warm and dry.  Psychiatric: She has a normal mood and affect.    ED Course  Procedures (including critical care time) Labs Review Labs Reviewed  CBC WITH DIFFERENTIAL - Abnormal; Notable for the following:    MCHC 36.3 (*)    Eosinophils Relative 6 (*)    All other components within normal limits  BASIC METABOLIC PANEL - Abnormal; Notable for the following:    Sodium 133 (*)    Glucose, Bld 103 (*)    All other components within normal limits  POCT PREGNANCY, URINE   Imaging Review Dg Chest 2 View  05/11/2013   CLINICAL DATA:  Shortness of Breath  EXAM: CHEST  2 VIEW  COMPARISON:  05/05/2013  FINDINGS: Cardiomediastinal silhouette is stable. Persistent mass in right upper lobe measures 2.6 cm. Improvement in aeration in right upper lobe without superimposed airspace disease. No pulmonary edema.  IMPRESSION: Persistent mass in right upper lobe measures 2.6 cm. Improvement in aeration in right upper lobe without superimposed airspace disease. No pulmonary edema.   Electronically Signed   By: Natasha Mead M.D.   On: 05/11/2013 19:41   Ct Chest W Contrast  05/11/2013   CLINICAL DATA:  42 year old female with recent lung mass biopsy 5 days ago. Continued pain. Initial encounter.  History of HIV.  EXAM: CT CHEST WITH CONTRAST  TECHNIQUE: Multidetector CT imaging of the chest was performed during intravenous contrast administration.  CONTRAST:  80mL OMNIPAQUE IOHEXOL 300 MG/ML  SOLN  COMPARISON:  Chest radiographs 1932 hr the same day and earlier.  FINDINGS: Posterior right upper lobe mostly solid appearing lung mass with irregular spiculated margins has not significantly changed in size or configuration since 03/30/2013, measuring 24 x 16 mm trans axially.  No pneumothorax. There is new patchy ground-glass opacity in the right apex, anterior to the mass (series 7, image 17, to a lesser extent image 13). At the same time there is new peripheral and peribronchovascular nodular opacity in the anterior right lung on image 23. Similar new findings in the lateral segment of the right middle lobe on image 35. Right lower lobe markings appear stable since October.  Major airways are patent in both lungs. There is subtle increased peripheral nodularity in the superior segment of the left lower lobe on image 31. Otherwise, left lung parenchyma is stable.  Stable small volume residual thymus. Visible thoracic inlet remains negative. Subcentimeter hilar and mediastinal lymph nodes are stable. Visualized aorta and other major mediastinal vascular structures appear within normal limits. No pericardial effusion. No pleural effusion.  Negative visualized upper abdominal viscera. Stable small bilateral axillary lymph nodes, somewhat increased in number.  No acute osseous abnormality identified.  IMPRESSION: 1. Dominant right upper lobe masslike opacity is stable since 03/29/2013. Correlate with biopsy results, but infectious etiology of this lesion is now felt unlikely.  2. No pneumothorax or pleural effusion, but there is new multilobar bilateral peri-bronchovascular and peripheral lung nodularity in keeping with a superimposed multilobar pneumonia.  3. Stable mediastinum with no lymphadenopathy. Stable  increased number of axillary nodes compatible with lymphoproliferative disorder in the setting of HIV.   Electronically Signed   By: Augusto Gamble M.D.   On: 05/11/2013 21:18    EKG Interpretation   None       MDM   1. Pain   2. Pulmonary nodule    Patient presents with chest pain in the setting of recent lung biopsy. She is nontoxic-appearing and vital signs are within normal limits. Chest pain is somewhat atypical. There is non-exertional component.  Suspect chest pain is likely secondary to recent lung biopsy and known pulmonary masses. Labwork was initiated. CT scan of the chest is remarkable for several new pulmonary nodules suggestive of infection. I discussed this with pulmonology to states patient can followup with Dr. Sherene Sires.  Pain improved with morphine.   While I suspect the patient's pain is secondary to her current pulmonary nodules and biopsies and she is low risk for ACS with only a risk factor of smoking, she does need a screening EKG. While I was dictating this note, I realized the patient did not receive a screening EKG in triage. I called the patient to ask her to return for screening EKG.  Dr. Dierdre Highman was made aware of patients return.    Shon Baton, MD 05/12/13 [phone]

## 2013-05-11 NOTE — Telephone Encounter (Signed)
She needs to try to cut down on these - not really clear they are helping anyway - but ok to give #60 while awaiting results of studies pending > make sure she keeps appt for pet next

## 2013-05-11 NOTE — Telephone Encounter (Signed)
Elizabeth [name] is aware and rx printed. Nothing further needed she will pick this up

## 2013-05-11 NOTE — Telephone Encounter (Signed)
lmtcb x1--pt was giving RX 05/06/13 #40

## 2013-05-11 NOTE — Telephone Encounter (Signed)
Sister Myrlene Broker returned call (POA). She reports pt is needing a refill on her norco. She was giving RX 05/06/13 #40. Per sister she is taking this 1 every 4 hrs and reason she is running out. Please advise Dr. Sherene Sires thanks

## 2013-05-11 NOTE — ED Notes (Signed)
Pt from home c/o R lung pain with pressure. Pt reports that she has lung biopsy 6 days ago. Pt states that she has pressure in R lung, coughing up small amt of blood. Pt has hydrocodone 7.5 x2 q 6hrs with no relief. Pt states that she tried to smoke a cigarette PTA and was unable to d/t pain. Pt reports that she was dx with R lung mass and is supposed to have surgery for removal after PET scan is done. Pt is A&O and in NAD. Pt mother and son at bedside

## 2013-05-12 ENCOUNTER — Telehealth: Payer: Self-pay | Admitting: Internal Medicine

## 2013-05-12 ENCOUNTER — Other Ambulatory Visit: Payer: Self-pay

## 2013-05-12 MED ORDER — OXYCODONE-ACETAMINOPHEN 5-325 MG PO TABS: 1.0000 | ORAL_TABLET | Freq: Four times a day (QID) | ORAL | Status: DC | PRN

## 2013-05-12 NOTE — Telephone Encounter (Signed)
Spoke with MW; explained to him about ER visit and new spots found. MW okayed to give patient Rx for Percocet and take her off of Norco. I have updated this in her chart and given her a new Rx.

## 2013-05-12 NOTE — ED Provider Notes (Signed)
Date: 05/12/2013  Rate: 88  Rhythm: normal sinus rhythm  QRS Axis: normal  Intervals: QT prolonged  ST/T Wave abnormalities: nonspecific ST changes  Conduction Disutrbances:none  Narrative Interpretation: NSR, prolonged QTc, no acute ischemia  Old EKG Reviewed: none available    Sunnie Nielsen, MD 05/12/13 0107

## 2013-05-17 ENCOUNTER — Emergency Department (HOSPITAL_COMMUNITY)
Admission: EM | Admit: 2013-05-17 | Discharge: 2013-05-18 | Disposition: A | Discharge status: Home / Self Care | Payer: No Typology Code available for payment source | Attending: Emergency Medicine | Admitting: Emergency Medicine

## 2013-05-17 ENCOUNTER — Emergency Department (HOSPITAL_COMMUNITY): Payer: No Typology Code available for payment source

## 2013-05-17 ENCOUNTER — Encounter (HOSPITAL_COMMUNITY): Payer: Self-pay | Admitting: Emergency Medicine

## 2013-05-17 DIAGNOSIS — R51 Headache: Secondary | ICD-10-CM | POA: Insufficient documentation

## 2013-05-17 DIAGNOSIS — Z21 Asymptomatic human immunodeficiency virus [HIV] infection status: Secondary | ICD-10-CM | POA: Insufficient documentation

## 2013-05-17 DIAGNOSIS — G40909 Epilepsy, unspecified, not intractable, without status epilepticus: Secondary | ICD-10-CM | POA: Insufficient documentation

## 2013-05-17 DIAGNOSIS — R569 Unspecified convulsions: Secondary | ICD-10-CM

## 2013-05-17 DIAGNOSIS — J45909 Unspecified asthma, uncomplicated: Secondary | ICD-10-CM | POA: Insufficient documentation

## 2013-05-17 DIAGNOSIS — Z79899 Other long term (current) drug therapy: Secondary | ICD-10-CM | POA: Insufficient documentation

## 2013-05-17 DIAGNOSIS — R0602 Shortness of breath: Secondary | ICD-10-CM | POA: Insufficient documentation

## 2013-05-17 DIAGNOSIS — F17200 Nicotine dependence, unspecified, uncomplicated: Secondary | ICD-10-CM | POA: Insufficient documentation

## 2013-05-17 LAB — BASIC METABOLIC PANEL
BUN: 15 mg/dL (ref 6–23)
Calcium: 9.1 mg/dL (ref 8.4–10.5)
Chloride: 100 mEq/L (ref 96–112)
Creatinine, Ser: 0.7 mg/dL (ref 0.50–1.10)
GFR calc Af Amer: >90 mL/min (ref >90)
GFR calc non Af Amer: >90 mL/min (ref >90)
Sodium: 132 mEq/L — ABNORMAL LOW (ref 135–145)

## 2013-05-17 LAB — CBC
HCT: 38 % (ref 36.0–46.0)
MCH: 30.2 pg (ref 26.0–34.0)
MCHC: 35.8 g/dL (ref 30.0–36.0)
MCV: 84.4 fL (ref 78.0–100.0)
RDW: 13 % (ref 11.5–15.5)
WBC: 6.5 10*3/uL (ref 4.0–10.5)

## 2013-05-17 LAB — GLUCOSE, CAPILLARY

## 2013-05-17 MED ORDER — SODIUM CHLORIDE 0.9 % IV SOLN
INTRAVENOUS | Status: DC
  Administered 2013-05-17: via INTRAVENOUS

## 2013-05-17 MED ORDER — FENTANYL CITRATE 0.05 MG/ML IJ SOLN
50.0000 ug | INTRAMUSCULAR | Status: DC | PRN
  Administered 2013-05-17 – 2013-05-18 (×2): 50 ug via INTRAVENOUS
  Filled 2013-05-17 (×2): qty 2

## 2013-05-17 MED ORDER — ONDANSETRON HCL 4 MG/2ML IJ SOLN
4.0000 mg | Freq: Once | INTRAMUSCULAR | Status: AC
  Administered 2013-05-17: 4 mg via INTRAVENOUS
  Filled 2013-05-17: qty 2

## 2013-05-17 NOTE — ED Provider Notes (Signed)
CSN: [id number]     Arrival date & time 05/17/13  2252 History   First MD Initiated Contact with Patient 05/17/13 2257     Chief Complaint  Patient presents with  . Seizures   (Consider location/radiation/quality/duration/timing/severity/associated sxs/prior Treatment) HPI Hx per PT - witnessed Sz at home tonight, caught by her son - no fall, lasted minutes, no tongue biting. Taking depakote as prescribed, denies any etoh or drug use. Has about 2 seizures a month.  Ha shad some nausea today but o/w no recent illness.  No F/C. No vomiting or diarrhea. Recent lung BX 2 weeks ago, PET scan next Wed and planned surgery this month. Has ongoing dyspnea.    Past Medical History  Diagnosis Date  . HIV (human immunodeficiency virus infection)   . Seizures   . Substance abuse   . Asthma    Past Surgical History  Procedure Laterality Date  . Cesarean section      x 3  . Video bronchoscopy Bilateral 05/05/2013    Procedure: VIDEO BRONCHOSCOPY WITH FLUORO;  Surgeon: Nyoka Cowden, MD;  Location: [facility];  Service: Cardiopulmonary;  Laterality: Bilateral;   Family History  Problem Relation Age of Onset  . Diabetes Mother   . CAD Mother   . Hypertension Mother   . Hypertension Sister   . Lung cancer Maternal Aunt     smoked  . Allergies Sister   . Asthma Sister   . Lung cancer Maternal Uncle     never smoker  . Lung cancer Maternal Grandmother     never smoker   History  Substance Use Topics  . Smoking status: Current Every Day Smoker -- 0.50 packs/day for 23 years    Types: Cigarettes  . Smokeless tobacco: Never Used  . Alcohol Use: No   OB History   Grav Para Term Preterm Abortions TAB SAB Ect Mult Living                 Review of Systems  Constitutional: Negative for fever and chills.  Eyes: Negative for visual disturbance.  Respiratory: Positive for shortness of breath.   Gastrointestinal: Negative for vomiting and abdominal pain.  Genitourinary: Negative for  dysuria.  Musculoskeletal: Negative for back pain, neck pain and neck stiffness.  Skin: Negative for rash.  Neurological: Positive for seizures and headaches.  All other systems reviewed and are negative.    Allergies  Gabapentin  Home Medications   Current Outpatient Rx  Name  Route  Sig  Dispense  Refill  . acetaminophen (TYLENOL) 500 MG tablet   Oral   Take 500 mg by mouth every 6 (six) hours as needed (pain).         Marland Kitchen albuterol (PROVENTIL HFA;VENTOLIN HFA) 108 (90 BASE) MCG/ACT inhaler   Inhalation   Inhale 2 puffs into the lungs every 4 (four) hours as needed for wheezing.   1 Inhaler   0   . divalproex (DEPAKOTE) 250 MG DR tablet   Oral   Take 250 mg by mouth daily.          Marland Kitchen elvitegravir-cobicistat-emtricitabine-tenofovir (STRIBILD) 150-150-200-300 MG TABS tablet   Oral   Take 1 tablet by mouth daily with breakfast.   30 tablet   6   . loperamide (IMODIUM) 2 MG capsule   Oral   Take 1 capsule (2 mg total) by mouth 4 (four) times daily as needed for diarrhea or loose stools.   12 capsule   0   .  oxyCODONE-acetaminophen (PERCOCET/ROXICET) 5-325 MG per tablet   Oral   Take 1-2 tablets by mouth every 6 (six) hours as needed for severe pain.   60 tablet   0   . promethazine (PHENERGAN) 12.5 MG tablet      1-2 every 6 hours prn nausea   20 tablet   0   . traZODone (DESYREL) 50 MG tablet   Oral   Take 50 mg by mouth at bedtime.            BP 136/86  Pulse 95  Temp(Src) 97.4 F (36.3 C) (Oral)  Resp 18  SpO2 95%  LMP 05/11/2013 Physical Exam  Constitutional: She is oriented to person, place, and time. She appears well-developed and well-nourished.  HENT:  Head: Normocephalic and atraumatic.  Mouth/Throat: Oropharynx is clear and moist.  No tongue lac  Eyes: EOM are normal. Pupils are equal, round, and reactive to light.  Neck: Neck supple. No tracheal deviation present.  Cardiovascular: Normal rate, regular rhythm and intact distal  pulses.   Pulmonary/Chest: Effort normal. No stridor. No respiratory distress.  Coarse bilat breath sounds  Abdominal: Soft. Bowel sounds are normal. She exhibits no distension. There is no tenderness. There is no rebound and no guarding.  Musculoskeletal: Normal range of motion. She exhibits no edema.  Neurological: She is alert and oriented to person, place, and time.  Skin: Skin is warm and dry.    ED Course  Procedures (including critical care time) Labs Review Labs Reviewed  BASIC METABOLIC PANEL - Abnormal; Notable for the following:    Sodium 132 (*)    Glucose, Bld 120 (*)    All other components within normal limits  VALPROIC ACID LEVEL - Abnormal; Notable for the following:    Valproic Acid Lvl <10.0 (*)    All other components within normal limits  GLUCOSE, CAPILLARY - Abnormal; Notable for the following:    Glucose-Capillary 118 (*)    All other components within normal limits  CBC   Imaging Review No results found.  2:34 AM patient feeling back to her baseline. Labs reviewed with her and family bedside, she insists that she takes medications as prescribed including Depakote. She has followup this week with her oncologist for PET scan for lung cancer. She is requesting something to help her sleep tonight and then would like to be discharged home. She has all medications. She agrees to return precautions. She states understanding all discharge and followup instructions.  MDM  Diagnosis: Seizure with history of seizure disorder returned to baseline  Evaluated with labs reviewed as above Medications provided Vital signs and nursing notes reviewed and considered  Sunnie Nielsen, MD 05/18/13 [phone]

## 2013-05-17 NOTE — ED Notes (Signed)
Brought in by EMS from home with c/o seizures.  Per EMS, pt's family reported that pt has had a seizure activity, was assisted down to floor during the episode.  Pt was alert on EMS' arrival at the house.  Pt has hx of seizure.

## 2013-05-18 LAB — VALPROIC ACID LEVEL: Valproic Acid Lvl: <10 ug/mL — ABNORMAL LOW (ref 50.0–100.0)

## 2013-05-18 MED ORDER — DIVALPROEX SODIUM 250 MG PO DR TAB
250.0000 mg | DELAYED_RELEASE_TABLET | Freq: Once | ORAL | Status: AC
  Administered 2013-05-18: 250 mg via ORAL
  Filled 2013-05-18: qty 1

## 2013-05-18 MED ORDER — DIPHENHYDRAMINE HCL 50 MG/ML IJ SOLN
25.0000 mg | Freq: Once | INTRAMUSCULAR | Status: AC
  Administered 2013-05-18: 25 mg via INTRAVENOUS
  Filled 2013-05-18: qty 1

## 2013-05-19 ENCOUNTER — Other Ambulatory Visit: Payer: Self-pay | Admitting: Internal Medicine

## 2013-05-19 ENCOUNTER — Ambulatory Visit (HOSPITAL_COMMUNITY)
Admission: RE | Admit: 2013-05-19 | Discharge: 2013-05-19 | Disposition: A | Discharge status: Home / Self Care | Payer: No Typology Code available for payment source | Source: Ambulatory Visit | Attending: Internal Medicine | Admitting: Internal Medicine

## 2013-05-19 ENCOUNTER — Encounter: Payer: Self-pay | Admitting: Internal Medicine

## 2013-05-19 DIAGNOSIS — R911 Solitary pulmonary nodule: Secondary | ICD-10-CM | POA: Insufficient documentation

## 2013-05-19 DIAGNOSIS — R918 Other nonspecific abnormal finding of lung field: Secondary | ICD-10-CM

## 2013-05-19 DIAGNOSIS — R091 Pleurisy: Secondary | ICD-10-CM | POA: Insufficient documentation

## 2013-05-19 DIAGNOSIS — J45909 Unspecified asthma, uncomplicated: Secondary | ICD-10-CM

## 2013-05-19 MED ORDER — FLUDEOXYGLUCOSE F - 18 (FDG) INJECTION
18.9000 | Freq: Once | INTRAVENOUS | Status: AC | PRN
  Administered 2013-05-19: 18.9 via INTRAVENOUS

## 2013-05-20 ENCOUNTER — Telehealth: Payer: Self-pay | Admitting: Internal Medicine

## 2013-05-20 ENCOUNTER — Encounter: Payer: Self-pay | Admitting: Internal Medicine

## 2013-05-20 ENCOUNTER — Ambulatory Visit (INDEPENDENT_AMBULATORY_CARE_PROVIDER_SITE_OTHER): Payer: Self-pay | Admitting: Internal Medicine

## 2013-05-20 VITALS — BP 124/88 | HR 92 | Temp 98.1°F | Ht 61.0 in | Wt 145.0 lb

## 2013-05-20 DIAGNOSIS — R222 Localized swelling, mass and lump, trunk: Secondary | ICD-10-CM

## 2013-05-20 DIAGNOSIS — R918 Other nonspecific abnormal finding of lung field: Secondary | ICD-10-CM

## 2013-05-20 DIAGNOSIS — B20 Human immunodeficiency virus [HIV] disease: Secondary | ICD-10-CM

## 2013-05-20 DIAGNOSIS — F17200 Nicotine dependence, unspecified, uncomplicated: Secondary | ICD-10-CM

## 2013-05-20 MED ORDER — OXYCODONE-ACETAMINOPHEN 5-325 MG PO TABS: 1.0000 | ORAL_TABLET | Freq: Four times a day (QID) | ORAL | Status: DC | PRN

## 2013-05-20 NOTE — Telephone Encounter (Signed)
RX printed and placed MW look at. Please advise once ready for pick up thanks

## 2013-05-20 NOTE — Telephone Encounter (Signed)
ok 

## 2013-05-20 NOTE — Telephone Encounter (Signed)
Percocet last refilled 05/12/13 #60 x 0 refills. Please advise MW thanks

## 2013-05-20 NOTE — Telephone Encounter (Signed)
Spoke with Cherie rx is ready for pick up  Nothing further needed

## 2013-05-21 NOTE — Assessment & Plan Note (Signed)
Recently started so will check labs in about 2-3 weeks and see her after labs.

## 2013-05-21 NOTE — Assessment & Plan Note (Signed)
Counseled on critical importance of cessation.  Patient aware and give info for [place] quit line.

## 2013-05-21 NOTE — Assessment & Plan Note (Signed)
Appreciate pulmonary and thoracic surgery management.  Had a + Candida on bronchoscopy but not considered significant.  Awaiting biopsy results for ? Lung CA.

## 2013-05-21 NOTE — Progress Notes (Signed)
   Subjective:    Patient ID: Elizabeth [name], female    DOB: 12-05-1970, 42 y.o.   MRN: [id number]  HPI Here for follow up of HIV and pulmonary mass.  Seen in November after ED visit for her first visit here and was found to have a mass on CXR and CT.  I sent her to pulmonary for evaluation and got a trial of Augmentin but no improvement, then PET concerning for non infectious etiology and non diagnostic bronchscopy.  Now to get lung biopsy or resection due to location.  Has some occasional hemoptysis.   Started Stribild about 1 week ago and good tolerance, no missed doses.  No weight loss, no diarrhea.  No fever.    Review of Systems  Constitutional: Negative for fever, chills, fatigue and unexpected weight change.  Respiratory: Positive for cough. Negative for shortness of breath.   Gastrointestinal: Negative for nausea and diarrhea.  Neurological: Negative for dizziness and light-headedness.  Hematological: Negative for adenopathy.  Psychiatric/Behavioral: Negative for dysphoric mood.       Objective:   Physical Exam  Constitutional: She appears well-developed and well-nourished. No distress.  HENT:  Mouth/Throat: No oropharyngeal exudate.  Eyes: Right eye exhibits no discharge. Left eye exhibits no discharge. No scleral icterus.  Cardiovascular: Normal rate, regular rhythm and normal heart sounds.   No murmur heard. Pulmonary/Chest: Effort normal and breath sounds normal. No respiratory distress. She has no wheezes.  Lymphadenopathy:    She has no cervical adenopathy.  Skin: Skin is warm and dry. No rash noted.  Psychiatric: She has a normal mood and affect. Her behavior is normal.          Assessment & Plan:

## 2013-05-24 ENCOUNTER — Telehealth: Payer: Self-pay | Admitting: Internal Medicine

## 2013-05-24 MED ORDER — OXYCODONE HCL ER 20 MG PO T12A: 20.0000 mg | EXTENDED_RELEASE_TABLET | Freq: Two times a day (BID) | ORAL | Status: DC

## 2013-05-24 NOTE — Telephone Encounter (Signed)
Sister is aware of recs. RX printed and will have MW sign. Nothing further needed

## 2013-05-24 NOTE — Telephone Encounter (Signed)
Last OV 05/06/13 No pending OV  Last fill 05/20/13 #60  MW - please advise on refill. Pt has been taking more than rx'd.

## 2013-05-24 NOTE — Telephone Encounter (Signed)
Will start oxycontin 20 mg bid and just use the other prn Give # 30

## 2013-05-25 ENCOUNTER — Telehealth: Payer: Self-pay | Admitting: Internal Medicine

## 2013-05-25 NOTE — Telephone Encounter (Signed)
lmomtcb x1 for sister I called [facility] and was told price for oxycodone 20 mg was $76.79 and at [facility] was $370

## 2013-05-25 NOTE — Telephone Encounter (Signed)
LMTCB

## 2013-05-25 NOTE — Telephone Encounter (Signed)
Patients sister returning call

## 2013-05-25 NOTE — Telephone Encounter (Signed)
Let's try oxyir 5  Up to 2 q3 hprn #100 and see if this is more affordable and in meantime need to sort out where we are with her T surgery eval scheduling

## 2013-05-25 NOTE — Telephone Encounter (Signed)
Sister called back and told us this pt tried to get this from [facility] and will cost them $370. Pt does not have medicaid yet so she can;t afford this. Pt does have her percocet's and will take this for now until MW comes back in the office on Monday. Please advise MW thanks  Allergies  Allergen Reactions  . Gabapentin Swelling

## 2013-05-26 ENCOUNTER — Emergency Department (HOSPITAL_COMMUNITY)
Admission: EM | Admit: 2013-05-26 | Discharge: 2013-05-26 | Disposition: A | Discharge status: Home / Self Care | Payer: No Typology Code available for payment source | Attending: Emergency Medicine | Admitting: Emergency Medicine

## 2013-05-26 ENCOUNTER — Emergency Department (HOSPITAL_COMMUNITY): Payer: No Typology Code available for payment source

## 2013-05-26 ENCOUNTER — Encounter (HOSPITAL_COMMUNITY): Payer: Self-pay | Admitting: Emergency Medicine

## 2013-05-26 DIAGNOSIS — T4275XA Adverse effect of unspecified antiepileptic and sedative-hypnotic drugs, initial encounter: Secondary | ICD-10-CM | POA: Insufficient documentation

## 2013-05-26 DIAGNOSIS — Z79899 Other long term (current) drug therapy: Secondary | ICD-10-CM | POA: Insufficient documentation

## 2013-05-26 DIAGNOSIS — R569 Unspecified convulsions: Secondary | ICD-10-CM

## 2013-05-26 DIAGNOSIS — G40909 Epilepsy, unspecified, not intractable, without status epilepticus: Secondary | ICD-10-CM | POA: Insufficient documentation

## 2013-05-26 DIAGNOSIS — J45909 Unspecified asthma, uncomplicated: Secondary | ICD-10-CM | POA: Insufficient documentation

## 2013-05-26 DIAGNOSIS — T07XXXA Unspecified multiple injuries, initial encounter: Secondary | ICD-10-CM | POA: Insufficient documentation

## 2013-05-26 DIAGNOSIS — Z21 Asymptomatic human immunodeficiency virus [HIV] infection status: Secondary | ICD-10-CM | POA: Insufficient documentation

## 2013-05-26 DIAGNOSIS — F17200 Nicotine dependence, unspecified, uncomplicated: Secondary | ICD-10-CM | POA: Insufficient documentation

## 2013-05-26 LAB — RAPID URINE DRUG SCREEN, HOSP PERFORMED
Amphetamines: NOT DETECTED
Barbiturates: NOT DETECTED
Benzodiazepines: POSITIVE — AB

## 2013-05-26 LAB — POCT I-STAT TROPONIN I: Troponin i, poc: 0 ng/mL (ref 0.00–0.08)

## 2013-05-26 LAB — GLUCOSE, CAPILLARY: Glucose-Capillary: 98 mg/dL (ref 70–99)

## 2013-05-26 LAB — ETHANOL: Alcohol, Ethyl (B): <11 mg/dL (ref 0–11)

## 2013-05-26 MED ORDER — VALPROATE SODIUM 500 MG/5ML IV SOLN
250.0000 mg | Freq: Once | INTRAVENOUS | Status: AC
  Administered 2013-05-26: 250 mg via INTRAVENOUS
  Filled 2013-05-26: qty 2.5

## 2013-05-26 MED ORDER — KETOROLAC TROMETHAMINE 30 MG/ML IJ SOLN
30.0000 mg | Freq: Once | INTRAMUSCULAR | Status: AC
  Administered 2013-05-26: 30 mg via INTRAVENOUS
  Filled 2013-05-26: qty 1

## 2013-05-26 MED ORDER — MORPHINE SULFATE 4 MG/ML IJ SOLN
4.0000 mg | Freq: Once | INTRAMUSCULAR | Status: AC
  Administered 2013-05-26: 4 mg via INTRAVENOUS
  Filled 2013-05-26: qty 1

## 2013-05-26 MED ORDER — HYDROMORPHONE HCL PF 1 MG/ML IJ SOLN
1.0000 mg | Freq: Once | INTRAMUSCULAR | Status: AC
  Administered 2013-05-26: 1 mg via INTRAVENOUS
  Filled 2013-05-26: qty 1

## 2013-05-26 MED ORDER — VALPROATE SODIUM 500 MG/5ML IV SOLN: 250.0000 mg | Freq: Once | INTRAVENOUS | Status: DC

## 2013-05-26 MED ORDER — OXYCODONE HCL 5 MG PO TABS: 5.0000 mg | ORAL_TABLET | ORAL | Status: DC | PRN

## 2013-05-26 NOTE — ED Notes (Signed)
Pt requesting additional pain medication-Dr. Roselyn Bering made aware..  Order noted for toradol-Merle RN made aware-awaiting valproic acid level for further disposition

## 2013-05-26 NOTE — Telephone Encounter (Signed)
MW out of the office until 12/29  Per KC (doc of the day today)- okay with this but only provide # 10 tablets at this time  I have printed rx and [facility] signed and it is up front for pick up  Pt's sister aware Nothing further needed

## 2013-05-26 NOTE — ED Notes (Signed)
Per Dr. Lynelle Doctor, pt able to have PO fluids.  Pt given a coke.
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
## 2013-05-26 NOTE — ED Notes (Signed)
Bed: WA17 Expected date:  Expected time:  Means of arrival:  Comments: EMS/unable to get trach back in

## 2013-05-26 NOTE — ED Notes (Signed)
Patient transported to CT 
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
## 2013-05-28 ENCOUNTER — Telehealth: Payer: Self-pay | Admitting: Pulmonary Disease

## 2013-05-28 MED ORDER — OXYCODONE-ACETAMINOPHEN 5-325 MG PO TABS: 1.0000 | ORAL_TABLET | Freq: Four times a day (QID) | ORAL | Status: DC | PRN

## 2013-05-28 NOTE — Telephone Encounter (Signed)
Pt came by today to pick up the rx for the oxycodone but stated that she would rather have the percocet.  Pt is waiting in the lobby.  KC please advise. Thanks  Allergies  Allergen Reactions  . Gabapentin Swelling

## 2013-05-28 NOTE — Telephone Encounter (Signed)
Per KC he will RX the percocet but we have to rip up the [employer]. Rx printed and Randell Loop will speak with pt. Nothing futher needed. Will forward to Dr. Sherene Sires as an Lorain Childes

## 2013-05-29 LAB — FUNGUS CULTURE W SMEAR: Fungal Smear: NONE SEEN

## 2013-05-31 ENCOUNTER — Telehealth: Payer: Self-pay | Admitting: Internal Medicine

## 2013-05-31 NOTE — Telephone Encounter (Signed)
Per MW- we can prescribe whatever pain med helps alleviate her pain the best and give # 100 tablets (pt likely having pain from CA) LMTCB for the pt

## 2013-05-31 NOTE — Telephone Encounter (Signed)
Per phone note 05/28/13: Tommie Sams, CMA at 05/28/2013 3:23 PM    Status: Signed       Per Jonathon Bellows he will RX the percocet but we have to rip up the [employer]. Rx printed and Randell Loop will speak with pt. Nothing futher needed. Will forward to Dr. Sherene Sires as an Hennie Duos, CMA at 05/28/2013 2:23 PM     Status: Signed        Pt came by today to pick up the rx for the oxycodone but stated that she would rather have the percocet. Pt is waiting in the lobby. KC please advise. Thanks  ---  Pt was giving #10 percocets. Please advise Dr. Sherene Sires thanks

## 2013-05-31 NOTE — Telephone Encounter (Signed)
She can have whichever she wants #100 as she likely has cancer pain

## 2013-05-31 NOTE — Telephone Encounter (Signed)
Pt's daughter is requesting to hear something soon-pt is out of pain meds & is hurting.  Elizabeth [name]

## 2013-06-01 ENCOUNTER — Institutional Professional Consult (permissible substitution) (INDEPENDENT_AMBULATORY_CARE_PROVIDER_SITE_OTHER): Payer: Self-pay | Admitting: Thoracic Surgery (Cardiothoracic Vascular Surgery)

## 2013-06-01 ENCOUNTER — Telehealth: Payer: Self-pay | Admitting: *Deleted

## 2013-06-01 ENCOUNTER — Other Ambulatory Visit: Payer: Self-pay | Admitting: *Deleted

## 2013-06-01 ENCOUNTER — Encounter: Payer: Self-pay | Admitting: Thoracic Surgery (Cardiothoracic Vascular Surgery)

## 2013-06-01 VITALS — BP 122/86 | HR 78 | Resp 16 | Ht 61.0 in | Wt 150.0 lb

## 2013-06-01 DIAGNOSIS — R911 Solitary pulmonary nodule: Secondary | ICD-10-CM

## 2013-06-01 DIAGNOSIS — G40909 Epilepsy, unspecified, not intractable, without status epilepticus: Secondary | ICD-10-CM

## 2013-06-01 DIAGNOSIS — Z21 Asymptomatic human immunodeficiency virus [HIV] infection status: Secondary | ICD-10-CM

## 2013-06-01 DIAGNOSIS — B20 Human immunodeficiency virus [HIV] disease: Secondary | ICD-10-CM

## 2013-06-01 MED ORDER — OXYCODONE-ACETAMINOPHEN 5-325 MG PO TABS: 1.0000 | ORAL_TABLET | Freq: Four times a day (QID) | ORAL | Status: DC | PRN

## 2013-06-01 NOTE — Telephone Encounter (Signed)
ok 

## 2013-06-01 NOTE — Telephone Encounter (Signed)
Patient walked into clinic requesting a referral to neurology for seizure disorder. This is not on patient's problem list and not discussed in office note. She said the pulmonologist will not do surgery until her seizures are under control. She went to the ED for this problem 05/26/13. Wendall Mola CMA

## 2013-06-01 NOTE — Telephone Encounter (Signed)
LMTCB

## 2013-06-01 NOTE — Telephone Encounter (Signed)
I spoke with the pt and she prefers the percocet so Rx written and placed at front for pick-up. Pt aware. Carron Curie, CMA

## 2013-06-01 NOTE — Progress Notes (Signed)
PCP is No PCP Per Patient Referring Provider is Nyoka Cowden, MD  Chief Complaint  Patient presents with  . Lung Lesion    eval and treat...CT CHEST 05/11/13, PET 05/21/13, CT HEAD 05/26/13....RULlobe nodule    HPI: 42 year old woman sent for evaluation of right upper lobe mass.  Elizabeth [name] is a 42 year old woman with a history of HIV and tobacco abuse (25 pack years). She complains of chest pain and shortness of breath. Her chest pain is in the right upper back under her shoulder blade and in the right anterior chest wall. She says this hurts all the time. It is not related to exertion. It is worsened with coughing or taking a deep breath. The pain started about 5 months ago. She also complains of feeling shortness of breath at all times, and difficulty catching her breath due to the pain. She has been taking oxycodone for the pain. She also complains of wheezing.  She was seen in the emergency room in October for shortness of breath. A CT of the chest at that time showed a right upper lobe mass. There also were several other areas in the lungs bilaterally suspicious for pneumonia.  She saw Dr. Sherene Sires and underwent a bronchoscopic biopsy. It was nondiagnostic. A PET CT showed the lesion to be hypermetabolic. Of note, the other infiltrates in the lung had cleared.  She smokes about one pack of cigarettes a day and has since is 42 years old. She did not want to quit.  She says her appetite has been poor but she gained about 30 pounds over the past 6 months. She attributes this to not being able to be active due to the pain.  She was diagnosed with HIV in 2006. She recently was seen by Dr. Luciana Axe for the first time.  She also complains of frequent seizures. She had 9 seizures in 1 day about a week ago and has been in the ED on multiple occasions for seizure the past couple of months. She says she had followup arranged with a neurologist (she's not sure of the name), but has not followed up with  them.    Past Medical History  Diagnosis Date  . HIV (human immunodeficiency virus infection)   . Seizures   . Substance abuse   . Asthma     Past Surgical History  Procedure Laterality Date  . Cesarean section      x 3  . Video bronchoscopy Bilateral 05/05/2013    Procedure: VIDEO BRONCHOSCOPY WITH FLUORO;  Surgeon: Nyoka Cowden, MD;  Location: [facility];  Service: Cardiopulmonary;  Laterality: Bilateral;    Family History  Problem Relation Age of Onset  . Diabetes Mother   . CAD Mother   . Hypertension Mother   . Hypertension Sister   . Lung cancer Maternal Aunt     smoked  . Allergies Sister   . Asthma Sister   . Lung cancer Maternal Uncle     never smoker  . Lung cancer Maternal Grandmother     never smoker    Social History History  Substance Use Topics  . Smoking status: Current Every Day Smoker -- 1.00 packs/day for 23 years    Types: Cigarettes  . Smokeless tobacco: Never Used  . Alcohol Use: No    Current Outpatient Prescriptions  Medication Sig Dispense Refill  . acetaminophen (TYLENOL) 500 MG tablet Take 500 mg by mouth every 6 (six) hours as needed (pain).      Marland Kitchen  albuterol (PROVENTIL HFA;VENTOLIN HFA) 108 (90 BASE) MCG/ACT inhaler Inhale 2 puffs into the lungs every 4 (four) hours as needed for wheezing.  1 Inhaler  0  . divalproex (DEPAKOTE) 250 MG DR tablet Take 250 mg by mouth daily.       Marland Kitchen elvitegravir-cobicistat-emtricitabine-tenofovir (STRIBILD) 150-150-200-300 MG TABS tablet Take 1 tablet by mouth daily with breakfast.  30 tablet  6  . oxyCODONE-acetaminophen (PERCOCET/ROXICET) 5-325 MG per tablet Take 1-2 tablets by mouth every 6 (six) hours as needed for severe pain.  100 tablet  0  . QUEtiapine (SEROQUEL) 200 MG tablet Take 200 mg by mouth at bedtime.       No current facility-administered medications for this visit.    Allergies  Allergen Reactions  . Gabapentin Swelling    Review of Systems  Constitutional: Positive for  appetite change (loss of appetite) and unexpected weight change (30 pound weight gain).  Respiratory: Positive for cough (hemoptysis only after biopsy) and shortness of breath.   Cardiovascular: Positive for chest pain (right upper back and anterior chest wall, pleuritic).  Gastrointestinal:       Difficulty swallowing  Genitourinary: Positive for frequency.  Musculoskeletal: Positive for gait problem.  Neurological: Positive for dizziness, seizures (frequent) and headaches.  Psychiatric/Behavioral: Positive for dysphoric mood.  All other systems reviewed and are negative.    BP 122/86  Pulse 78  Resp 16  Ht 5\' 1"  (1.549 m)  Wt 150 lb (68.04 kg)  BMI 28.36 kg/m2  SpO2 97%  LMP 05/19/2013 Physical Exam  Vitals reviewed. Constitutional: She is oriented to person, place, and time. She appears well-developed and well-nourished. No distress.  HENT:  Head: Normocephalic and atraumatic.  Eyes: EOM are normal. Pupils are equal, round, and reactive to light.  Neck: No thyromegaly present.  Cardiovascular: Normal rate, regular rhythm and normal heart sounds.  Exam reveals no gallop and no friction rub.   No murmur heard. Pulmonary/Chest: Effort normal and breath sounds normal. She has no wheezes.  Abdominal: Soft. There is no tenderness.  Musculoskeletal: She exhibits no edema.  Lymphadenopathy:    She has no cervical adenopathy.  Neurological: She is alert and oriented to person, place, and time. No cranial nerve deficit.  No focal motor deficits  Skin: Skin is warm and dry.     Diagnostic Tests: CT chest 05/11/2013 FINDINGS:  Posterior right upper lobe mostly solid appearing lung mass with  irregular spiculated margins has not significantly changed in size  or configuration since 03/30/2013, measuring 24 x 16 mm trans  axially.  No pneumothorax. There is new patchy ground-glass opacity in the  right apex, anterior to the mass (series 7, image 17, to a lesser  extent image  13). At the same time there is new peripheral and  peribronchovascular nodular opacity in the anterior right lung on  image 23. Similar new findings in the lateral segment of the right  middle lobe on image 35. Right lower lobe markings appear stable  since October.  Major airways are patent in both lungs. There is subtle increased  peripheral nodularity in the superior segment of the left lower lobe  on image 31. Otherwise, left lung parenchyma is stable.  Stable small volume residual thymus. Visible thoracic inlet remains  negative. Subcentimeter hilar and mediastinal lymph nodes are  stable. Visualized aorta and other major mediastinal vascular  structures appear within normal limits. No pericardial effusion. No  pleural effusion.  Negative visualized upper abdominal viscera. Stable small bilateral  axillary lymph nodes, somewhat increased in number.  No acute osseous abnormality identified.  IMPRESSION:  1. Dominant right upper lobe masslike opacity is stable since  03/29/2013. Correlate with biopsy results, but infectious etiology  of this lesion is now felt unlikely.  2. No pneumothorax or pleural effusion, but there is new multilobar  bilateral peri-bronchovascular and peripheral lung nodularity in  keeping with a superimposed multilobar pneumonia.  3. Stable mediastinum with no lymphadenopathy. 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   PET/CT 05/19/2013 IMPRESSION:  1. 2.7 x 1.9 cm aggressive appearing pleural-based right upper lobe  nodule demonstrates diffuse hyper metabolism, and is highly  suspicious for primary bronchogenic carcinoma. There is no  associated mediastinal or hilar lymphadenopathy at this time, and no  definite signs of distant metastatic disease on today's examination.  Assuming a primary non-small cell carcinoma, this would constitute  T1b, N1, Mx disease (i.e., likely stage IA).  Despite this low stage,  the proximity of this lesion to the overlying pleura and the  prominent pleural retraction is concerning. At this time, there is  no pleural effusion to strongly suggest malignant pleural  involvement.  2. Near complete resolution of previously noted areas of  ground-glass attenuation and centrilobular ground-glass attenuation  micronodularity in the lungs bilaterally, likely to represent a  resolving bronchopneumonia.  3. 4 mm right upper lobe nodule is unchanged compared to the prior  study, and highly nonspecific. Attention on followup studies is  recommended.  CT head 05/26/2013 IMPRESSION:  No acute intracranial abnormality.  Impression: 42 year old smoker with a history of HIV who has a 2.7 x 1.9 cm right upper lobe mass. The lesion is hypermetabolic by PET CT. 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 Given her pain there is potential for chest wall invasion. The other items in the differential include infection and inflammatory lesions. However given the size and appearance of this lesion needs to be considered a lung cancer unless he can be proven otherwise.  Bronchoscopic biopsy was unrevealing. This lesion would be relatively easy to biopsy percutaneously. However that would not change our decision making. The negative result could not mean that the lesion did not need to be resected. A positive result would merely confirm the lesion needs to be resected.  Therefore, I would recommend proceeding with right VATS and wedge resection, possible lobectomy and possible chest wall resection depending on the intraoperative findings.  Unfortunately she's had multiple ER visits with repeated seizures recently. She has failed to followup with neurology is recommended. I do think this needs to be addressed before we put her through a major pulmonary resection, as long as it can be done within a reasonable period of time.  I did discuss with the  patient and her family the CT and PET findings. We reviewed the films. We discussed the differential diagnosis. I discussed the reasoning behind my recommendation for surgical resection. We discussed the general nature of the operation including the need for general anesthesia, incisions be used, possible need for a chest wall resection, the expected hospital stay, and the overall recovery. We also discussed the indications, risks, benefits, and alternatives. They understand the risks include but are not limited to death, MI, DVT, PE, bleeding, possible need for transfusion, infection, prolonged air leak, other organ system dysfunction including respiratory, renal, and GI complications.  Plan: Followup with neurology regarding seizures  Pulmonary function testing  Return in 2 weeks  to discuss surgical resection.

## 2013-06-02 NOTE — Telephone Encounter (Signed)
Maybe she can get to [facility] then.  She has possible lung cancer and they want neuro to see her before her lung resection.  thanks

## 2013-06-02 NOTE — Telephone Encounter (Signed)
Have just found out that patient is uninsured; so may not be able to refer to neurology until she obtains Medicaid. Neurology does not participate with the discount card. Wendall Mola

## 2013-06-02 NOTE — Telephone Encounter (Signed)
Patient scheduled appointment at [facility] with Dr. Leatrice Jewels for 06/23/13. She is aware of appt date, time, and address. Records faxed and Dr. Sherene Sires and Thoracic surgeon to fax their records as well. Fax # [phone]. Wendall Mola CMA

## 2013-06-07 ENCOUNTER — Encounter: Payer: Self-pay | Admitting: Obstetrics & Gynecology

## 2013-06-08 ENCOUNTER — Ambulatory Visit (HOSPITAL_COMMUNITY)
Admission: RE | Admit: 2013-06-08 | Discharge: 2013-06-08 | Disposition: A | Discharge status: Home / Self Care | Payer: No Typology Code available for payment source | Source: Ambulatory Visit | Attending: Thoracic Surgery (Cardiothoracic Vascular Surgery) | Admitting: Thoracic Surgery (Cardiothoracic Vascular Surgery)

## 2013-06-08 DIAGNOSIS — R911 Solitary pulmonary nodule: Secondary | ICD-10-CM | POA: Insufficient documentation

## 2013-06-08 LAB — BLOOD GAS, ARTERIAL
Acid-base deficit: 1.6 mmol/L (ref 0.0–2.0)
BICARBONATE: 22.1 meq/L (ref 20.0–24.0)
Drawn by: 24485
FIO2: 0.21 %
O2 Saturation: 96.2 %
PCO2 ART: 34.4 mmHg — AB (ref 35.0–45.0)
PO2 ART: 76.7 mmHg — AB (ref 80.0–100.0)
Patient temperature: 98.6
TCO2: 23.2 mmol/L (ref 0–100)
pH, Arterial: 7.424 (ref 7.350–7.450)

## 2013-06-08 LAB — PULMONARY FUNCTION TEST
DL/VA % PRED: 79 %
DL/VA: 3.5 ml/min/mmHg/L
DLCO UNC % PRED: 64 %
DLCO UNC: 13.08 ml/min/mmHg
DLCO cor % pred: 63 %
DLCO cor: 12.92 ml/min/mmHg
FEF 25-75 Post: 0.88 L/sec
FEF 25-75 Pre: 0.59 L/sec
FEF2575-%Change-Post: 47 %
FEF2575-%Pred-Post: 29 %
FEF2575-%Pred-Pre: 20 %
FEV1-%Change-Post: 10 %
FEV1-%Pred-Post: 53 %
FEV1-%Pred-Pre: 48 %
FEV1-PRE: 1.33 L
FEV1-Post: 1.46 L
FEV1FVC-%Change-Post: 5 %
FEV1FVC-%Pred-Pre: 73 %
FEV6-%Change-Post: 5 %
FEV6-%PRED-PRE: 65 %
FEV6-%Pred-Post: 69 %
FEV6-POST: 2.28 L
FEV6-Pre: 2.16 L
FEV6FVC-%CHANGE-POST: 0 %
FEV6FVC-%PRED-POST: 100 %
FEV6FVC-%PRED-PRE: 99 %
FVC-%CHANGE-POST: 4 %
FVC-%PRED-PRE: 66 %
FVC-%Pred-Post: 69 %
FVC-POST: 2.32 L
FVC-Pre: 2.21 L
PRE FEV1/FVC RATIO: 60 %
Post FEV1/FVC ratio: 63 %
Post FEV6/FVC ratio: 98 %
Pre FEV6/FVC Ratio: 98 %
RV % PRED: 117 %
RV: 1.71 L
TLC % pred: 90 %
TLC: 4.15 L

## 2013-06-08 MED ORDER — ALBUTEROL SULFATE (2.5 MG/3ML) 0.083% IN NEBU
2.5000 mg | INHALATION_SOLUTION | Freq: Once | RESPIRATORY_TRACT | Status: AC
  Administered 2013-06-08: 2.5 mg via RESPIRATORY_TRACT

## 2013-06-10 ENCOUNTER — Emergency Department (HOSPITAL_COMMUNITY): Payer: No Typology Code available for payment source

## 2013-06-10 ENCOUNTER — Emergency Department (HOSPITAL_COMMUNITY)
Admission: EM | Admit: 2013-06-10 | Discharge: 2013-06-10 | Disposition: A | Discharge status: Home / Self Care | Payer: No Typology Code available for payment source | Attending: Emergency Medicine | Admitting: Emergency Medicine

## 2013-06-10 ENCOUNTER — Encounter (HOSPITAL_COMMUNITY): Payer: Self-pay | Admitting: Emergency Medicine

## 2013-06-10 ENCOUNTER — Other Ambulatory Visit: Payer: Self-pay | Admitting: *Deleted

## 2013-06-10 DIAGNOSIS — T4275XA Adverse effect of unspecified antiepileptic and sedative-hypnotic drugs, initial encounter: Secondary | ICD-10-CM | POA: Insufficient documentation

## 2013-06-10 DIAGNOSIS — Z21 Asymptomatic human immunodeficiency virus [HIV] infection status: Secondary | ICD-10-CM | POA: Insufficient documentation

## 2013-06-10 DIAGNOSIS — R5381 Other malaise: Secondary | ICD-10-CM | POA: Insufficient documentation

## 2013-06-10 DIAGNOSIS — G40909 Epilepsy, unspecified, not intractable, without status epilepticus: Secondary | ICD-10-CM | POA: Insufficient documentation

## 2013-06-10 DIAGNOSIS — R079 Chest pain, unspecified: Secondary | ICD-10-CM | POA: Insufficient documentation

## 2013-06-10 DIAGNOSIS — Z79899 Other long term (current) drug therapy: Secondary | ICD-10-CM | POA: Insufficient documentation

## 2013-06-10 DIAGNOSIS — J45909 Unspecified asthma, uncomplicated: Secondary | ICD-10-CM | POA: Insufficient documentation

## 2013-06-10 DIAGNOSIS — F17200 Nicotine dependence, unspecified, uncomplicated: Secondary | ICD-10-CM | POA: Insufficient documentation

## 2013-06-10 DIAGNOSIS — R569 Unspecified convulsions: Secondary | ICD-10-CM

## 2013-06-10 DIAGNOSIS — R5383 Other fatigue: Secondary | ICD-10-CM

## 2013-06-10 DIAGNOSIS — R51 Headache: Secondary | ICD-10-CM | POA: Insufficient documentation

## 2013-06-10 LAB — CBC WITH DIFFERENTIAL/PLATELET
Basophils Absolute: 0 10*3/uL (ref 0.0–0.1)
Basophils Relative: 0 % (ref 0–1)
EOS ABS: 0.2 10*3/uL (ref 0.0–0.7)
EOS PCT: 3 % (ref 0–5)
HEMATOCRIT: 38.4 % (ref 36.0–46.0)
HEMOGLOBIN: 13.8 g/dL (ref 12.0–15.0)
LYMPHS ABS: 1.2 10*3/uL (ref 0.7–4.0)
LYMPHS PCT: 21 % (ref 12–46)
MCH: 30.3 pg (ref 26.0–34.0)
MCHC: 35.9 g/dL (ref 30.0–36.0)
MCV: 84.2 fL (ref 78.0–100.0)
MONO ABS: 0.4 10*3/uL (ref 0.1–1.0)
MONOS PCT: 7 % (ref 3–12)
Neutro Abs: 4.2 10*3/uL (ref 1.7–7.7)
Neutrophils Relative %: 70 % (ref 43–77)
PLATELETS: 290 10*3/uL (ref 150–400)
RBC: 4.56 MIL/uL (ref 3.87–5.11)
RDW: 13.7 % (ref 11.5–15.5)
WBC: 6 10*3/uL (ref 4.0–10.5)

## 2013-06-10 LAB — BASIC METABOLIC PANEL
BUN: 15 mg/dL (ref 6–23)
CHLORIDE: 100 meq/L (ref 96–112)
CO2: 23 meq/L (ref 19–32)
CREATININE: 0.77 mg/dL (ref 0.50–1.10)
Calcium: 9.1 mg/dL (ref 8.4–10.5)
GFR calc non Af Amer: >90 mL/min (ref >90)
GLUCOSE: 105 mg/dL — AB (ref 70–99)
Potassium: 3.5 mEq/L — ABNORMAL LOW (ref 3.7–5.3)
Sodium: 134 mEq/L — ABNORMAL LOW (ref 137–147)

## 2013-06-10 LAB — VALPROIC ACID LEVEL

## 2013-06-10 MED ORDER — HYDROMORPHONE HCL PF 1 MG/ML IJ SOLN
1.0000 mg | Freq: Once | INTRAMUSCULAR | Status: AC
  Administered 2013-06-10: 1 mg via INTRAVENOUS
  Filled 2013-06-10: qty 1

## 2013-06-10 MED ORDER — DIVALPROEX SODIUM 250 MG PO DR TAB: 750.0000 mg | DELAYED_RELEASE_TABLET | Freq: Two times a day (BID) | ORAL | Status: DC

## 2013-06-10 MED ORDER — ONDANSETRON HCL 4 MG/2ML IJ SOLN
4.0000 mg | Freq: Once | INTRAMUSCULAR | Status: AC
  Administered 2013-06-10: 4 mg via INTRAVENOUS
  Filled 2013-06-10: qty 2

## 2013-06-10 MED ORDER — HYDROMORPHONE HCL 4 MG PO TABS: 4.0000 mg | ORAL_TABLET | ORAL | Status: DC | PRN

## 2013-06-10 MED ORDER — DIVALPROEX SODIUM 500 MG PO DR TAB
750.0000 mg | DELAYED_RELEASE_TABLET | Freq: Once | ORAL | Status: AC
  Administered 2013-06-10: 750 mg via ORAL
  Filled 2013-06-10 (×2): qty 1

## 2013-06-10 NOTE — ED Provider Notes (Addendum)
CSN: [id number]     Arrival date & time 06/10/13  1656 History   First MD Initiated Contact with Patient 06/10/13 1706     Chief Complaint  Patient presents with  . Seizures  . Chest Pain  . Headache   (Consider location/radiation/quality/duration/timing/severity/associated sxs/prior Treatment) HPI Comments: Pt with hx of lung CA currently waiting to start chemo/radiation and also scheduled for lung resection.  States CP has gradually worsened and she is always SOB.  She denies fever but ongoing productive cough which is no different than baseline.  Pt currently not using oxygen at home and is being followed by pulmonologist.  Patient also states approximately a month ago her HIV meds were changed to 2 her other one for not being as effective. She states she has done well with the medication and is taking it as prescribed. She was seen in the emergency room approximately a week and half ago for seizure at that time found to have subtherapeutic Depakote levels. Since that time she's been taking her medication as prescribed and has not missed any doses.  Patient is a 43 y.o. female presenting with seizures, chest pain, and headaches. The history is provided by the patient.  Seizures Seizure activity on arrival: no   Seizure type:  Grand mal Preceding symptoms comment:  States felt tired before. Initial focality:  Diffuse Episode characteristics: generalized shaking   Postictal symptoms: confusion   Return to baseline: yes   Severity:  Moderate Timing:  Once Progression:  Resolved Context: decreased sleep   Context: not change in medication and medical compliance   Recent head injury:  No recent head injuries History of seizures: yes   Similar to previous episodes: yes   Date of most recent prior episode:  12/24 Severity:  Severe Seizure control level:  Poorly controlled Current therapy:  Phenytoin and valproic acid Compliance with current therapy:  Good Chest Pain Associated symptoms:  fatigue and headache   Associated symptoms: no fever   Headache Associated symptoms: fatigue and seizures   Associated symptoms: no fever     Past Medical History  Diagnosis Date  . HIV (human immunodeficiency virus infection)   . Seizures   . Substance abuse   . Asthma    Past Surgical History  Procedure Laterality Date  . Cesarean section      x 3  . Video bronchoscopy Bilateral 05/05/2013    Procedure: VIDEO BRONCHOSCOPY WITH FLUORO;  Surgeon: Tanda Rockers, MD;  Location: [facility];  Service: Cardiopulmonary;  Laterality: Bilateral;   Family History  Problem Relation Age of Onset  . Diabetes Mother   . CAD Mother   . Hypertension Mother   . Hypertension Sister   . Lung cancer Maternal Aunt     smoked  . Allergies Sister   . Asthma Sister   . Lung cancer Maternal Uncle     never smoker  . Lung cancer Maternal Grandmother     never smoker   History  Substance Use Topics  . Smoking status: Current Every Day Smoker -- 1.00 packs/day for 23 years    Types: Cigarettes  . Smokeless tobacco: Never Used  . Alcohol Use: No   OB History   Grav Para Term Preterm Abortions TAB SAB Ect Mult Living                 Review of Systems  Constitutional: Positive for fatigue. Negative for fever.  Cardiovascular: Positive for chest pain.  Neurological: Positive for seizures  and headaches.  All other systems reviewed and are negative.    Allergies  Gabapentin  Home Medications   Current Outpatient Rx  Name  Route  Sig  Dispense  Refill  . acetaminophen (TYLENOL) 500 MG tablet   Oral   Take 500 mg by mouth every 6 (six) hours as needed (pain).         Marland Kitchen albuterol (PROVENTIL HFA;VENTOLIN HFA) 108 (90 BASE) MCG/ACT inhaler   Inhalation   Inhale 2 puffs into the lungs every 4 (four) hours as needed for wheezing.   1 Inhaler   0   . divalproex (DEPAKOTE) 250 MG DR tablet   Oral   Take 250 mg by mouth daily.          Marland Kitchen  elvitegravir-cobicistat-emtricitabine-tenofovir (STRIBILD) 150-150-200-300 MG TABS tablet   Oral   Take 1 tablet by mouth daily with breakfast.   30 tablet   6   . oxyCODONE-acetaminophen (PERCOCET/ROXICET) 5-325 MG per tablet   Oral   Take 1-2 tablets by mouth every 6 (six) hours as needed for severe pain.   100 tablet   0   . QUEtiapine (SEROQUEL) 200 MG tablet   Oral   Take 200 mg by mouth at bedtime.          BP 126/79  Pulse 88  Temp(Src) 98.5 F (36.9 C) (Oral)  Resp 17  SpO2 94%  LMP 05/19/2013 Physical Exam  Nursing note and vitals reviewed. Constitutional: She is oriented to person, place, and time. She appears well-developed and well-nourished. No distress.  HENT:  Head: Normocephalic and atraumatic.  Mouth/Throat: Oropharynx is clear and moist.  Eyes: Conjunctivae and EOM are normal. Pupils are equal, round, and reactive to light.  Neck: Normal range of motion. Neck supple.  Cardiovascular: Normal rate, regular rhythm and intact distal pulses.   No murmur heard. Pulmonary/Chest: Effort normal. No respiratory distress. She has decreased breath sounds. She has no wheezes. She has no rales. She exhibits tenderness.    Abdominal: Soft. She exhibits no distension. There is no tenderness. There is no rebound and no guarding.  Musculoskeletal: Normal range of motion. She exhibits no edema and no tenderness.  Neurological: She is alert and oriented to person, place, and time.  Skin: Skin is warm and dry. No rash noted. No erythema.  Psychiatric: She has a normal mood and affect. Her behavior is normal.    ED Course  Procedures (including critical care time) Labs Review Labs Reviewed  VALPROIC ACID LEVEL - Abnormal; Notable for the following:    Valproic Acid Lvl <10.0 (*)    All other components within normal limits  BASIC METABOLIC PANEL - Abnormal; Notable for the following:    Sodium 134 (*)    Potassium 3.5 (*)    Glucose, Bld 105 (*)    All other  components within normal limits  CBC WITH DIFFERENTIAL   Imaging Review Dg Chest 2 View  06/10/2013   CLINICAL DATA:  Shortness of breath.  EXAM: CHEST  2 VIEW  COMPARISON:  03/29/2013, 05/11/2013  FINDINGS: The cardiac silhouette study upper limits normal. A 2.3 cm pulmonary nodule again projects within the right upper lobe. No new focal infiltrates nor new masses or nodules are identified. The osseous structures unremarkable.  IMPRESSION: No evidence of acute cardiopulmonary disease. Stable right upper lobe pulmonary nodule.   Electronically Signed   By: Margaree Mackintosh M.D.   On: 06/10/2013 17:58    EKG Interpretation   None  MDM   1. Seizure   2. Seizure secondary to subtherapeutic anticonvulsant medication     Patient with a significant history HIV who, seizures, asthma and recent video bronchoscopy showing lung cancer. She has frequent seizures despite taking her Depakote regularly. Last seizure was approximately one and half weeks ago at which time her Depakote level was found to be subtherapeutic. She denies any missed doses. Her main complaint is of worsening chest pain where the cancer is. Persistent productive cough without fever or change in mucus production. She denies any hematemesis. She's been taking Percocet 1-2 mg every 6 hours as needed for pain because she is unable to avoid the OxyContin. Patient is scheduled for sleep and and further evaluation of her epilepsy at [facility] at the end of this month and is scheduled to see cardiothoracic surgery and her pulmonologist on the 13th for further evaluation and treatment recommendations. She is currently not started chemotherapy or radiation.  CBC, i-STAT and valproic acid level pending. Chest x-ray pending. Patient given pain control.  8:14 PM Depakote is undetectable.  Will load orally and d/c home.  Pt given 739m of depakote.  Also home dose is very low and speaking with pharmacy will increase to 1 tab in morning and 2 at  night.  Will f/u with neuro.  WBlanchie Dessert MD 06/10/13 2015  WBlanchie Dessert MD 06/10/13 22130 WBlanchie Dessert MD 06/10/13 2050

## 2013-06-10 NOTE — ED Notes (Signed)
Pt from home. Had witness seizure, lasted 3-5 minutes with whole body movement. Mother told EMS pt was postictal afterwords, but alert and oriented upon EMS arrival. Pt denies any injury. Pt states she has been having a seizure a week for the past few weeks as depakote dose adjusted. Pt also complains of HA and blurred vision since seizure, denies any worsening of symptoms since HA began. Pt also complains of chest pain that is worse than her normal chronic chest pain, sob same as usual.

## 2013-06-10 NOTE — ED Notes (Signed)
Bed: WA03 Expected date:  Expected time:  Means of arrival:  Comments: EMS-seizure

## 2013-06-11 ENCOUNTER — Other Ambulatory Visit: Payer: Self-pay

## 2013-06-15 ENCOUNTER — Ambulatory Visit: Payer: No Typology Code available for payment source | Admitting: Thoracic Surgery (Cardiothoracic Vascular Surgery)

## 2013-06-15 ENCOUNTER — Telehealth: Payer: Self-pay | Admitting: Internal Medicine

## 2013-06-15 MED ORDER — OXYCODONE-ACETAMINOPHEN 5-325 MG PO TABS: 1.0000 | ORAL_TABLET | Freq: Four times a day (QID) | ORAL | Status: DC | PRN

## 2013-06-15 NOTE — Telephone Encounter (Signed)
Spoke with pt. She is wanting a refill on Percocet.  Last OV 05/06/13 No pending OV Last fill 06/01/13 #100  MW - please advise on refill. Thanks.

## 2013-06-15 NOTE — Telephone Encounter (Signed)
Ok x one but wasn't she supposed to be seeing and following his recs so be sure to do so as this is the best bet to correct the pain and Dr Lemmie Evens will take over post op but if needs more from me in future will need ov first

## 2013-06-15 NOTE — Telephone Encounter (Signed)
Pt is aware of MW rec's. Rx has been printed and given to MW to sign. Pt is aware that she will need to come to the office and pick up this rx.

## 2013-06-17 ENCOUNTER — Other Ambulatory Visit (INDEPENDENT_AMBULATORY_CARE_PROVIDER_SITE_OTHER): Payer: Self-pay

## 2013-06-17 DIAGNOSIS — B20 Human immunodeficiency virus [HIV] disease: Secondary | ICD-10-CM

## 2013-06-17 LAB — CBC WITH DIFFERENTIAL/PLATELET
Basophils Absolute: 0 10*3/uL (ref 0.0–0.1)
Basophils Relative: 0 % (ref 0–1)
EOS ABS: 0.2 10*3/uL (ref 0.0–0.7)
Eosinophils Relative: 4 % (ref 0–5)
HCT: 39.3 % (ref 36.0–46.0)
Hemoglobin: 13.8 g/dL (ref 12.0–15.0)
Lymphocytes Relative: 44 % (ref 12–46)
Lymphs Abs: 1.9 10*3/uL (ref 0.7–4.0)
MCH: 30.1 pg (ref 26.0–34.0)
MCHC: 35.1 g/dL (ref 30.0–36.0)
MCV: 85.6 fL (ref 78.0–100.0)
MONO ABS: 0.3 10*3/uL (ref 0.1–1.0)
MONOS PCT: 6 % (ref 3–12)
Neutro Abs: 2 10*3/uL (ref 1.7–7.7)
Neutrophils Relative %: 46 % (ref 43–77)
PLATELETS: 264 10*3/uL (ref 150–400)
RBC: 4.59 MIL/uL (ref 3.87–5.11)
RDW: 14.3 % (ref 11.5–15.5)
WBC: 4.3 10*3/uL (ref 4.0–10.5)

## 2013-06-17 LAB — COMPLETE METABOLIC PANEL WITH GFR
ALT: 8 U/L (ref 0–35)
AST: 15 U/L (ref 0–37)
Albumin: 3.6 g/dL (ref 3.5–5.2)
Alkaline Phosphatase: 59 U/L (ref 39–117)
BUN: 9 mg/dL (ref 6–23)
CALCIUM: 9.1 mg/dL (ref 8.4–10.5)
CHLORIDE: 104 meq/L (ref 96–112)
CO2: 23 mEq/L (ref 19–32)
CREATININE: 0.72 mg/dL (ref 0.50–1.10)
GFR, Est African American: >89 mL/min
GFR, Est Non African American: >89 mL/min
Glucose, Bld: 99 mg/dL (ref 70–99)
POTASSIUM: 3.9 meq/L (ref 3.5–5.3)
Sodium: 135 mEq/L (ref 135–145)
Total Bilirubin: 0.3 mg/dL (ref 0.3–1.2)
Total Protein: 7 g/dL (ref 6.0–8.3)

## 2013-06-17 LAB — AFB CULTURE WITH SMEAR (NOT AT ARMC): Acid Fast Smear: NONE SEEN

## 2013-06-20 LAB — HIV-1 RNA QUANT-NO REFLEX-BLD
HIV 1 RNA Quant: 49 copies/mL — ABNORMAL HIGH (ref <20)
HIV-1 RNA Quant, Log: 1.69 {Log} — ABNORMAL HIGH (ref <1.30)

## 2013-06-21 LAB — T-HELPER CELL (CD4) - (RCID CLINIC ONLY)
CD4 T CELL HELPER: 44 % (ref 33–55)
CD4 T Cell Abs: 840 /uL (ref 400–2700)

## 2013-06-22 ENCOUNTER — Encounter: Payer: Self-pay | Admitting: Thoracic Surgery (Cardiothoracic Vascular Surgery)

## 2013-06-22 ENCOUNTER — Other Ambulatory Visit: Payer: Self-pay | Admitting: *Deleted

## 2013-06-22 ENCOUNTER — Ambulatory Visit (INDEPENDENT_AMBULATORY_CARE_PROVIDER_SITE_OTHER): Payer: Self-pay | Admitting: Thoracic Surgery (Cardiothoracic Vascular Surgery)

## 2013-06-22 VITALS — BP 111/78 | HR 92 | Resp 20 | Ht 61.0 in | Wt 150.0 lb

## 2013-06-22 DIAGNOSIS — G40909 Epilepsy, unspecified, not intractable, without status epilepticus: Secondary | ICD-10-CM

## 2013-06-22 DIAGNOSIS — R918 Other nonspecific abnormal finding of lung field: Secondary | ICD-10-CM

## 2013-06-22 DIAGNOSIS — Z21 Asymptomatic human immunodeficiency virus [HIV] infection status: Secondary | ICD-10-CM

## 2013-06-22 DIAGNOSIS — R911 Solitary pulmonary nodule: Secondary | ICD-10-CM

## 2013-06-22 DIAGNOSIS — B20 Human immunodeficiency virus [HIV] disease: Secondary | ICD-10-CM

## 2013-06-22 NOTE — Progress Notes (Signed)
HPI:  43 year old woman sent for evaluation of right upper lobe mass.  Elizabeth [name] is a 43 year old woman with a history of HIV and tobacco abuse (25 pack years). She complains of chest pain and shortness of breath. Her chest pain is in the right upper back under her shoulder blade and in the right anterior chest wall. She says this hurts all the time. It is not related to exertion. It is worsened with coughing or taking a deep breath. The pain started about 5 months ago. She also complains of feeling shortness of breath at all times, and difficulty catching her breath due to the pain. She has been taking oxycodone for the pain. She also complains of wheezing.  She was seen in the emergency room in October for shortness of breath. A CT of the chest at that time showed a right upper lobe mass. There also were several other areas in the lungs bilaterally suspicious for pneumonia.   She saw Dr. Melvyn Novas and underwent a bronchoscopic biopsy. It was nondiagnostic. A PET CT showed the lesion to be hypermetabolic. Of note, the other infiltrates in the lung had cleared.   She smokes about one pack of cigarettes a day and has since is 43 years old. She does not want to quit.   She says her appetite has been poor but she gained about 30 pounds over the past 6 months. She attributes this to not being able to be active due to the pain.   She was diagnosed with HIV in 2006. She recently was seen by Dr. Linus Salmons for the first time.  She also complains of frequent seizures. She had 9 seizures in 1 day about a week ago and has been in the ED on multiple occasions for seizure the past couple of months. She says she had followup arranged with a neurologist (she's not sure of the name), but has not followed up with them.  06/22/2013  Elizabeth [name] returns today after having pulmonary function testing. She continues to have a right upper chest pain. She is taking oxycodone for that. She was seen in the emergency room again with  seizures. She has an appointment to see a neurologist Dr. Lynnette Caffey at [facility] tomorrow.  Past Medical History  Diagnosis Date  . HIV (human immunodeficiency virus infection)   . Seizures   . Substance abuse   . Asthma    Past Surgical History  Procedure Laterality Date  . Cesarean section      x 3  . Video bronchoscopy Bilateral 05/05/2013    Procedure: VIDEO BRONCHOSCOPY WITH FLUORO;  Surgeon: Tanda Rockers, MD;  Location: [facility];  Service: Cardiopulmonary;  Laterality: Bilateral;       Current Outpatient Prescriptions  Medication Sig Dispense Refill  . acetaminophen (TYLENOL) 500 MG tablet Take 500 mg by mouth every 6 (six) hours as needed (pain).      Marland Kitchen albuterol (PROVENTIL HFA;VENTOLIN HFA) 108 (90 BASE) MCG/ACT inhaler Inhale 2 puffs into the lungs every 4 (four) hours as needed for wheezing.  1 Inhaler  0  . divalproex (DEPAKOTE) 250 MG DR tablet Take 3 tablets (750 mg total) by mouth 2 (two) times daily. Take 1 tab in the morning and 2 tabs at night  90 tablet  0  . elvitegravir-cobicistat-emtricitabine-tenofovir (STRIBILD) 150-150-200-300 MG TABS tablet Take 1 tablet by mouth daily with breakfast.  30 tablet  6  . HYDROmorphone (DILAUDID) 4 MG tablet Take 1 tablet (4 mg total) by mouth every  4 (four) hours as needed for severe pain.  30 tablet  0  . oxyCODONE-acetaminophen (PERCOCET/ROXICET) 5-325 MG per tablet Take 1-2 tablets by mouth every 6 (six) hours as needed for severe pain.  100 tablet  0  . QUEtiapine (SEROQUEL) 200 MG tablet Take 200 mg by mouth at bedtime.       No current facility-administered medications for this visit.    Physical Exam BP 111/78  Pulse 92  Resp 20  Ht 5' 1"  (1.549 m)  Wt 150 lb (68.04 kg)  BMI 28.36 kg/m2  SpO2 97%  LMP 06/15/2013 43 year old woman in obvious discomfort, but no acute distress Well-developed well-nourished Flat affect Neurologic alert and oriented x3 with no focal deficits Cardiac regular rate and  rhythm normal S1 and S2 Lungs diminished breath sounds bilaterally, no wheezes No cervical or supraclavicular adenopathy Abdomen benign Extremities no clubbing cyanosis or edema  Diagnostic Tests: Pulmonary function testing 06/29/2013 FVC 2.12 (66%), 2.32 post bronchodilator FEV1 1.33 (48%), 1.46 (53%) post DLCO 63% predicted  ABG 06/29/2013 Room air 7.42/34/77/22/96%  Impression: 43 year old woman with HIV who has a long history of tobacco abuse and COPD. She has a right upper lobe mass which is hypermetabolic by PET. This abuts the chest wall and may be responsible for her chest pain. The pain certainly seems pleuritic in nature.  I recommended that we proceed with right VATS, wedge resection, possible lobectomy and possible chest wall resection. We did discuss the risks at her last visit. She is aware that the risk include, but are not limited to, death, MI, DVT, PE, bleeding, possible need for transfusion, infection, air leak, as well as other organ system dysfunction such as respiratory failure, renal failure, or GI complications. She accepts these risks and wishes to proceed.  She is seeing a neurologist tomorrow. We will await the results of that visit. In the meantime we will plan to proceed with surgery on Monday, February 2 unless there are issues that need to be addressed by neurology.  Plan: Right VATS, wedge resection, possible lobectomy, possible chest wall resection on Monday, February 2.

## 2013-06-25 ENCOUNTER — Encounter (HOSPITAL_COMMUNITY): Payer: Self-pay | Admitting: Pharmacy Technician

## 2013-06-28 ENCOUNTER — Telehealth: Payer: Self-pay | Admitting: Internal Medicine

## 2013-06-28 NOTE — Telephone Encounter (Signed)
Tanda Rockers, MD at 06/15/2013 2:13 PM     Status: Signed        Ok x one but wasn't she supposed to be seeing and following his recs so be sure to do so as this is the best bet to correct the pain and Dr Lemmie Evens will take over post op but if needs more from me in future will need ov first  --  I spoke with pt. She reports she spoke with Dr. Roxan Hockey and was advised he could not give her any pain medication until after her surgery. Until then she reports this needs to come from [facility] please advise MW thanks

## 2013-06-28 NOTE — Telephone Encounter (Signed)
LMOMTCB x 1 

## 2013-06-28 NOTE — Telephone Encounter (Signed)
Ok but need date of surgery or next ov with T surgery and agree on amt of pills she needs per day - what I don't want to happen is for her to put off the surgery and just cover up the pain with pain pills which won't help her problem in the long run

## 2013-06-29 ENCOUNTER — Ambulatory Visit: Payer: Self-pay | Admitting: Internal Medicine

## 2013-06-29 ENCOUNTER — Encounter: Payer: Self-pay | Admitting: Internal Medicine

## 2013-06-29 ENCOUNTER — Ambulatory Visit (INDEPENDENT_AMBULATORY_CARE_PROVIDER_SITE_OTHER): Payer: Self-pay | Admitting: Internal Medicine

## 2013-06-29 ENCOUNTER — Telehealth: Payer: Self-pay | Admitting: Internal Medicine

## 2013-06-29 VITALS — BP 102/60 | HR 84 | Temp 97.9°F | Ht 61.0 in | Wt 149.0 lb

## 2013-06-29 DIAGNOSIS — J449 Chronic obstructive pulmonary disease, unspecified: Secondary | ICD-10-CM

## 2013-06-29 DIAGNOSIS — F17200 Nicotine dependence, unspecified, uncomplicated: Secondary | ICD-10-CM

## 2013-06-29 DIAGNOSIS — R918 Other nonspecific abnormal finding of lung field: Secondary | ICD-10-CM

## 2013-06-29 DIAGNOSIS — R222 Localized swelling, mass and lump, trunk: Secondary | ICD-10-CM

## 2013-06-29 MED ORDER — OXYCODONE-ACETAMINOPHEN 5-325 MG PO TABS: 1.0000 | ORAL_TABLET | Freq: Four times a day (QID) | ORAL | Status: DC | PRN

## 2013-06-29 NOTE — Progress Notes (Signed)
Subjective:    Patient ID: Elizabeth [name], female    DOB: April 11, 1971  MRN: [id number]   Brief patient profile:  82 yowf HIV since 2008 actively smoking referred to pulmonary clinic 04/16/2013 by Dr comer with sob and cough and RUL density with plans for excisional bx 07/05/13.    History of Present Illness  04/16/2013 1st [facility] office visit/ Elizabeth [name]  Chief Complaint  Patient presents with  . Pulmonary Consult    Referred per Dr. Gara Kroner for eval of abn CT Chest. The pt c/o CP and dyspnea x 5 months. She states that she gets out of breath just walking from one room to the next and wakes up gasping for air. She also c/o prod cough with minimal yellow sputum x 5 months.    cp anterior x onset early Oct 2014 , worse with cough. Sputum never bloody  zpak helped some  saba helps doe some  Poor dentition and having active Seizures since July 2013 (denies ever seeing neuorlogist as outpt) rec The key is to stop smoking completely before smoking completely stops you!  Augmentin 875 mg take one pill twice daily  X 10 days       04/30/2013 f/u ov/Elizabeth [name] still smoking  re: RUL density  Chief Complaint  Patient presents with  . Follow-up    Pt c/o SOB constantly, waking her up at night.  Pt went to ED Saturday for seizures. Pt feels worse now than at her lv 2wks ago.   Hurt worse since ran out of pain meds but was needing to up to 2 every 4 hours until 3 days prior to OV  And no better p augmentin. Mucus remains dark but not bloody. Since ov had multiple sz's but not seeing in neurologist Doe and at rest not better p saba Pain / sob constant now 24/7 and generalized anterior on R, localized on R post below scapula tip rec Fob > done 05/05/13 with hemoptysis immediately p procedure   05/08/2013 acute  ov/Elizabeth [name] re: hemoptysis/ gagging from coughing  Chief Complaint  Patient presents with  . Acute Visit    pt c/o nausea, cannot eat anything. Pt also c/o coughing up blood since  procedure. I advised the pt that this is normal, but triage advised her to come in for OV.    no sob at rest Amt of brb is tapering off to less than a tsp on day of ov - total amt about a quarter cup mixed with saliva. rec Keep taking the hydrocodone up to 2 every 6 hours for pain or cough For nausea phenergan  12.5 mg 1-2 every 6 hours as needed  Soups, broth based first, then add noodles and crackers  Last = veggies/ salads/ dairy products We will arrange for PET scan next > c/w ? Limited  ca so referred for T surg    06/29/2013 f/u ov/Elizabeth [name] re: pain control  Chief Complaint  Patient presents with  . Follow-up    Pt here to discuss pain management- Dr Roxan Hockey advised he would not prescribe anything until after her surgery 07/05/13.     Percocet 2 every 6 = 8 per day = 60 needed until surgery (delayed by surg and eval at [facility]) Still smoking   No obvious day to day or daytime variabilty or assoc  chest tightness, subjective wheeze overt sinus or hb symptoms. No unusual exp hx or h/o childhood pna/ asthma or knowledge of premature birth.    Also denies  any obvious fluctuation of symptoms with weather or environmental changes or other aggravating or alleviating factors except as outlined above   Current Medications, Allergies, Complete Past Medical History, Past Surgical History, Family History, and Social History were reviewed in [employer] record.  ROS  The following are not active complaints unless bolded sore throat, dysphagia, dental problems, itching, sneezing,  nasal congestion or excess/ purulent secretions, ear ache,   fever, chills, sweats, unintended wt loss, pleuritic or exertional cp, hemoptysis,  orthopnea pnd or leg swelling, presyncope, palpitations, heartburn, abdominal pain, anorexia, nausea, vomiting, diarrhea  or change in bowel or urinary habits, change in stools or urine, dysuria,hematuria,  rash, arthralgias, visual complaints, headache, numbness  weakness or ataxia or problems with walking or coordination,  change in mood/affect or memory.                  Objective:   Physical Exam   Anxious wf nad  .06/29/2013      149  Wt Readings from Last 3 Encounters:  05/06/13 149 lb 12.8 oz (67.949 kg)  04/30/13 149 lb (67.586 kg)  04/16/13 149 lb (67.586 kg)        HEENT mild turbinate edema.  Oropharynx no thrush or excess pnd or cobblestoning.  No JVD or cervical adenopathy. Mild accessory muscle hypertrophy. Trachea midline, nl thryroid. Chest was hyperinflated by percussion with diminished breath sounds and moderate increased exp time without wheeze. Hoover sign positive at mid inspiration. Regular rate and rhythm without murmur gallop or rub or increase P2 or edema.  Abd: no hsm, nl excursion. Ext warm without cyanosis or clubbing.  No rash or cw tenderness   CT chest 03/30/13 24 x 15 x 18 mm mildly enhancing right upper lobe pulmonary mass  with indistinct, spiculated margins, and this may reflect primary  lung neoplasm, possibly pneumonia    CXR   05/05/13  1. Right upper lobe airspace opacity around the biopsied mass,  favoring pulmonary hemorrhage. No pneumothorax observed.    Assessment & Plan:

## 2013-06-29 NOTE — Patient Instructions (Signed)
Ok to continue to take the percocet up to 2 every 6 hours if needed to control the pain  Keep all follow up appts with Dr Roxan Hockey who will order your post op pain meds and refer you either back here or to the appropriate specialist for out patient follow up once we know the diagnsosis  The key is to stop smoking completely before smoking completely stops you- this must be done now to reduce your risk of pulmonary complications from surgery

## 2013-06-29 NOTE — Telephone Encounter (Signed)
Pt called back and appt scheduled with Dr Melvyn Novas.

## 2013-06-29 NOTE — Telephone Encounter (Signed)
Appt has been set with MW to discuss medications. [place], CMA

## 2013-06-29 NOTE — Telephone Encounter (Signed)
lmtcb In the last telephone note when pt requested this medication (06/15/13), MW stated that pt would need OV for further fills. No pending OVs. She will need ROV before this can be refilled.

## 2013-06-30 ENCOUNTER — Encounter: Payer: Self-pay | Admitting: Internal Medicine

## 2013-06-30 NOTE — Assessment & Plan Note (Signed)
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%) ratio 60 and no sign resp to B2 with dlco 64 corrects to 54%   She could certainly tolerate excisional bx if not formal RULobectomy but will defer final call to Dr Roxan Hockey.  However, needs to quit smoking completely now to reduce post op pulmonary complications, reviewed

## 2013-06-30 NOTE — Assessment & Plan Note (Signed)
-   rx augmentin 04/16/13 x 10 days > no better at all - FOB 05/05/2013 > nl airways, BAL and tbbx x 3 > antracosis, no atypia, neg afb and fungal smears > POS FUNGAL CULTURE reported 05/11/2013  >>> - PET 05/19/2013 >  C/w Stage I Lung ca > referred to T surgery > scheduled for 07/05/13   We will supply narcotics until surgery   See instructions for specific recommendations which were reviewed directly with the patient who was given a copy with highlighter outlining the key components.

## 2013-06-30 NOTE — Assessment & Plan Note (Signed)
>   5 min   Risks of smoking pre-op reviewed, pt cutting down and considering stopping.

## 2013-07-01 ENCOUNTER — Encounter (HOSPITAL_COMMUNITY)
Admission: RE | Admit: 2013-07-01 | Discharge: 2013-07-01 | Disposition: A | Discharge status: Home / Self Care | Payer: No Typology Code available for payment source | Source: Ambulatory Visit | Attending: Thoracic Surgery (Cardiothoracic Vascular Surgery) | Admitting: Thoracic Surgery (Cardiothoracic Vascular Surgery)

## 2013-07-01 ENCOUNTER — Encounter (HOSPITAL_COMMUNITY): Payer: Self-pay

## 2013-07-01 ENCOUNTER — Encounter: Payer: Self-pay | Admitting: Obstetrics & Gynecology

## 2013-07-01 VITALS — BP 114/54 | HR 77 | Temp 98.0°F | Resp 18 | Ht 61.0 in | Wt 151.5 lb

## 2013-07-01 DIAGNOSIS — Z01818 Encounter for other preprocedural examination: Secondary | ICD-10-CM | POA: Insufficient documentation

## 2013-07-01 DIAGNOSIS — R918 Other nonspecific abnormal finding of lung field: Secondary | ICD-10-CM

## 2013-07-01 DIAGNOSIS — Z01812 Encounter for preprocedural laboratory examination: Secondary | ICD-10-CM | POA: Insufficient documentation

## 2013-07-01 HISTORY — DX: Personal history of other diseases of the nervous system and sense organs: Z86.69

## 2013-07-01 HISTORY — DX: Nocturia: R35.1

## 2013-07-01 HISTORY — DX: Shortness of breath: R06.02

## 2013-07-01 HISTORY — DX: Dysphagia, unspecified: R13.10

## 2013-07-01 HISTORY — DX: Dizziness and giddiness: R42

## 2013-07-01 HISTORY — DX: Frequency of micturition: R35.0

## 2013-07-01 HISTORY — DX: Headache: R51

## 2013-07-01 HISTORY — DX: Major depressive disorder, single episode, unspecified: F32.9

## 2013-07-01 HISTORY — DX: Depression, unspecified: F32.A

## 2013-07-01 LAB — BLOOD GAS, ARTERIAL
Acid-Base Excess: 2.3 mmol/L — ABNORMAL HIGH (ref 0.0–2.0)
BICARBONATE: 26.4 meq/L — AB (ref 20.0–24.0)
Drawn by: 206361
FIO2: 0.21 %
O2 Saturation: 95.3 %
PO2 ART: 70.5 mmHg — AB (ref 80.0–100.0)
Patient temperature: 98.6
TCO2: 27.7 mmol/L (ref 0–100)
pCO2 arterial: 42.1 mmHg (ref 35.0–45.0)
pH, Arterial: 7.414 (ref 7.350–7.450)

## 2013-07-01 LAB — COMPREHENSIVE METABOLIC PANEL
ALK PHOS: 64 U/L (ref 39–117)
ALT: 7 U/L (ref 0–35)
AST: 12 U/L (ref 0–37)
Albumin: 3.3 g/dL — ABNORMAL LOW (ref 3.5–5.2)
BILIRUBIN TOTAL: 0.3 mg/dL (ref 0.3–1.2)
BUN: 11 mg/dL (ref 6–23)
CHLORIDE: 100 meq/L (ref 96–112)
CO2: 24 meq/L (ref 19–32)
Calcium: 9 mg/dL (ref 8.4–10.5)
Creatinine, Ser: 0.6 mg/dL (ref 0.50–1.10)
GLUCOSE: 100 mg/dL — AB (ref 70–99)
POTASSIUM: 4.2 meq/L (ref 3.7–5.3)
Sodium: 136 mEq/L — ABNORMAL LOW (ref 137–147)
Total Protein: 7.6 g/dL (ref 6.0–8.3)

## 2013-07-01 LAB — URINALYSIS, ROUTINE W REFLEX MICROSCOPIC
Bilirubin Urine: NEGATIVE
Glucose, UA: NEGATIVE mg/dL
HGB URINE DIPSTICK: NEGATIVE
Ketones, ur: NEGATIVE mg/dL
Leukocytes, UA: NEGATIVE
NITRITE: NEGATIVE
PROTEIN: NEGATIVE mg/dL
Specific Gravity, Urine: 1.01 (ref 1.005–1.030)
UROBILINOGEN UA: 1 mg/dL (ref 0.0–1.0)
pH: 6 (ref 5.0–8.0)

## 2013-07-01 LAB — TYPE AND SCREEN
ABO/RH(D): A POS
ANTIBODY SCREEN: NEGATIVE

## 2013-07-01 LAB — ABO/RH: ABO/RH(D): A POS

## 2013-07-01 LAB — CBC
HEMATOCRIT: 36.6 % (ref 36.0–46.0)
Hemoglobin: 13.3 g/dL (ref 12.0–15.0)
MCH: 31.1 pg (ref 26.0–34.0)
MCHC: 36.3 g/dL — ABNORMAL HIGH (ref 30.0–36.0)
MCV: 85.5 fL (ref 78.0–100.0)
Platelets: 290 10*3/uL (ref 150–400)
RBC: 4.28 MIL/uL (ref 3.87–5.11)
RDW: 13.5 % (ref 11.5–15.5)
WBC: 8.3 10*3/uL (ref 4.0–10.5)

## 2013-07-01 LAB — SURGICAL PCR SCREEN
MRSA, PCR: NEGATIVE
Staphylococcus aureus: NEGATIVE

## 2013-07-01 LAB — APTT: aPTT: 40 seconds — ABNORMAL HIGH (ref 24–37)

## 2013-07-01 LAB — PROTIME-INR
INR: 1.06 (ref 0.00–1.49)
PROTHROMBIN TIME: 13.6 s (ref 11.6–15.2)

## 2013-07-01 NOTE — Progress Notes (Signed)
Pt doesn't have a cardiologist  Denies ever having an echo/stress test/heart cath  Medical Md is with HIV clinic Dr.COmer   EKG done at [facility] last week-to request report

## 2013-07-01 NOTE — Pre-Procedure Instructions (Signed)
Elizabeth [name]  07/01/2013   Your procedure is scheduled on:  Mon, Feb 2 @ 7:30 AM  Report to [facility]  at 5:30 AM.  Call this number if you have problems the morning of surgery: [phone]   Remember:   Do not eat food or drink liquids after midnight.   Take these medicines the morning of surgery with A SIP OF WATER: Albuterol<Bring Your Inhaler With You>,Depakote(Divalproex),and Pain Pill(if needed)              No Goody's,BC's,Aleve,Aspirin,Ibuprofen,Fish Oil,or any Herbal Medications   Do not wear jewelry, make-up or nail polish.  Do not wear lotions, powders, or perfumes. You may wear deodorant.  Do not shave 48 hours prior to surgery.   Do not bring valuables to the hospital.  [facility] is not responsible                  for any belongings or valuables.               Contacts, dentures or bridgework may not be worn into surgery.  Leave suitcase in the car. After surgery it may be brought to your room.  For patients admitted to the hospital, discharge time is determined by your                treatment team.                Special Instructions: Shower using CHG 2 nights before surgery and the night before surgery.  If you shower the day of surgery use CHG.  Use special wash - you have one bottle of CHG for all showers.  You should use approximately 1/3 of the bottle for each shower.   Please read over the following fact sheets that you were given: Pain Booklet, Coughing and Deep Breathing, Blood Transfusion Information, MRSA Information and Surgical Site Infection Prevention

## 2013-07-04 ENCOUNTER — Encounter (HOSPITAL_COMMUNITY): Payer: Self-pay | Admitting: Certified Registered Nurse Anesthetist

## 2013-07-04 MED ORDER — DEXTROSE 5 % IV SOLN
1.5000 g | INTRAVENOUS | Status: AC
  Administered 2013-07-05: 1.5 g via INTRAVENOUS
  Filled 2013-07-04: qty 1.5

## 2013-07-05 ENCOUNTER — Inpatient Hospital Stay (HOSPITAL_COMMUNITY): Payer: Medicaid Other

## 2013-07-05 ENCOUNTER — Inpatient Hospital Stay (HOSPITAL_COMMUNITY)
Admission: RE | Admit: 2013-07-05 | Discharge: 2013-07-13 | DRG: 164 | Disposition: A | Discharge status: Home / Self Care | Payer: Medicaid Other | Source: Ambulatory Visit | Attending: Thoracic Surgery (Cardiothoracic Vascular Surgery) | Admitting: Thoracic Surgery (Cardiothoracic Vascular Surgery)

## 2013-07-05 ENCOUNTER — Encounter: Payer: Self-pay | Admitting: Internal Medicine

## 2013-07-05 ENCOUNTER — Inpatient Hospital Stay (HOSPITAL_COMMUNITY): Payer: Medicaid Other | Admitting: Certified Registered Nurse Anesthetist

## 2013-07-05 ENCOUNTER — Encounter (HOSPITAL_COMMUNITY)
Admission: RE | Disposition: A | Discharge status: Home / Self Care | Payer: Self-pay | Source: Ambulatory Visit | Attending: Thoracic Surgery (Cardiothoracic Vascular Surgery)

## 2013-07-05 ENCOUNTER — Encounter (HOSPITAL_COMMUNITY): Payer: Medicaid Other | Admitting: Vascular Surgery

## 2013-07-05 ENCOUNTER — Encounter (HOSPITAL_COMMUNITY): Payer: Self-pay | Admitting: *Deleted

## 2013-07-05 DIAGNOSIS — F17200 Nicotine dependence, unspecified, uncomplicated: Secondary | ICD-10-CM | POA: Diagnosis present

## 2013-07-05 DIAGNOSIS — J4489 Other specified chronic obstructive pulmonary disease: Secondary | ICD-10-CM | POA: Diagnosis present

## 2013-07-05 DIAGNOSIS — R569 Unspecified convulsions: Secondary | ICD-10-CM | POA: Diagnosis present

## 2013-07-05 DIAGNOSIS — C3410 Malignant neoplasm of upper lobe, unspecified bronchus or lung: Principal | ICD-10-CM | POA: Diagnosis present

## 2013-07-05 DIAGNOSIS — Z79899 Other long term (current) drug therapy: Secondary | ICD-10-CM

## 2013-07-05 DIAGNOSIS — Z21 Asymptomatic human immunodeficiency virus [HIV] infection status: Secondary | ICD-10-CM | POA: Diagnosis present

## 2013-07-05 DIAGNOSIS — R222 Localized swelling, mass and lump, trunk: Secondary | ICD-10-CM | POA: Diagnosis present

## 2013-07-05 DIAGNOSIS — I498 Other specified cardiac arrhythmias: Secondary | ICD-10-CM | POA: Diagnosis not present

## 2013-07-05 DIAGNOSIS — K5900 Constipation, unspecified: Secondary | ICD-10-CM | POA: Diagnosis not present

## 2013-07-05 DIAGNOSIS — D62 Acute posthemorrhagic anemia: Secondary | ICD-10-CM | POA: Diagnosis not present

## 2013-07-05 DIAGNOSIS — J449 Chronic obstructive pulmonary disease, unspecified: Secondary | ICD-10-CM | POA: Diagnosis present

## 2013-07-05 DIAGNOSIS — J40 Bronchitis, not specified as acute or chronic: Secondary | ICD-10-CM | POA: Diagnosis not present

## 2013-07-05 DIAGNOSIS — J9819 Other pulmonary collapse: Secondary | ICD-10-CM | POA: Diagnosis present

## 2013-07-05 DIAGNOSIS — G8918 Other acute postprocedural pain: Secondary | ICD-10-CM | POA: Diagnosis not present

## 2013-07-05 DIAGNOSIS — J95811 Postprocedural pneumothorax: Secondary | ICD-10-CM | POA: Diagnosis not present

## 2013-07-05 DIAGNOSIS — F1910 Other psychoactive substance abuse, uncomplicated: Secondary | ICD-10-CM | POA: Diagnosis present

## 2013-07-05 DIAGNOSIS — R918 Other nonspecific abnormal finding of lung field: Secondary | ICD-10-CM | POA: Diagnosis present

## 2013-07-05 HISTORY — PX: VIDEO ASSISTED THORACOSCOPY (VATS)/WEDGE RESECTION: SHX6174

## 2013-07-05 LAB — GLUCOSE, CAPILLARY: Glucose-Capillary: 109 mg/dL — ABNORMAL HIGH (ref 70–99)

## 2013-07-05 LAB — HCG, SERUM, QUALITATIVE: Preg, Serum: NEGATIVE

## 2013-07-05 SURGERY — VIDEO ASSISTED THORACOSCOPY (VATS)/WEDGE RESECTION
Anesthesia: General | Site: Chest | Laterality: Right

## 2013-07-05 MED ORDER — HEMOSTATIC AGENTS (NO CHARGE) OPTIME
TOPICAL | Status: DC | PRN
  Administered 2013-07-05: 1 via TOPICAL

## 2013-07-05 MED ORDER — QUETIAPINE FUMARATE 200 MG PO TABS
200.0000 mg | ORAL_TABLET | Freq: Every day | ORAL | Status: DC
  Administered 2013-07-05 – 2013-07-12 (×5): 200 mg via ORAL
  Filled 2013-07-05 (×9): qty 1

## 2013-07-05 MED ORDER — FENTANYL 10 MCG/ML IV SOLN
INTRAVENOUS | Status: DC
  Administered 2013-07-05: 210 ug via INTRAVENOUS
  Administered 2013-07-05: 15:00:00 via INTRAVENOUS
  Administered 2013-07-06: 10 ug via INTRAVENOUS
  Administered 2013-07-06: 50 ug via INTRAVENOUS
  Filled 2013-07-05: qty 50

## 2013-07-05 MED ORDER — BUPIVACAINE 0.5 % ON-Q PUMP SINGLE CATH 400 ML
INJECTION | Status: DC | PRN
  Administered 2013-07-05: 400 mL

## 2013-07-05 MED ORDER — BUPIVACAINE HCL (PF) 0.5 % IJ SOLN: INTRAMUSCULAR | Status: DC | PRN

## 2013-07-05 MED ORDER — ONDANSETRON HCL 4 MG/2ML IJ SOLN: 4.0000 mg | Freq: Four times a day (QID) | INTRAMUSCULAR | Status: DC | PRN

## 2013-07-05 MED ORDER — TRAZODONE HCL 100 MG PO TABS
200.0000 mg | ORAL_TABLET | Freq: Every day | ORAL | Status: DC
Start: 2013-07-05 — End: 2013-07-13
  Administered 2013-07-05 – 2013-07-12 (×8): 200 mg via ORAL
  Filled 2013-07-05 (×10): qty 2

## 2013-07-05 MED ORDER — GLYCOPYRROLATE 0.2 MG/ML IJ SOLN
INTRAMUSCULAR | Status: DC | PRN
Start: 2013-07-05 — End: 2013-07-05
  Administered 2013-07-05: .4 mg via INTRAVENOUS

## 2013-07-05 MED ORDER — EPHEDRINE SULFATE 50 MG/ML IJ SOLN
INTRAMUSCULAR | Status: AC
  Filled 2013-07-05: qty 1

## 2013-07-05 MED ORDER — PROPOFOL 10 MG/ML IV BOLUS
INTRAVENOUS | Status: AC
  Filled 2013-07-05: qty 20

## 2013-07-05 MED ORDER — FENTANYL CITRATE 0.05 MG/ML IJ SOLN
INTRAMUSCULAR | Status: AC
  Filled 2013-07-05: qty 5

## 2013-07-05 MED ORDER — DIPHENHYDRAMINE HCL 12.5 MG/5ML PO ELIX
12.5000 mg | ORAL_SOLUTION | Freq: Four times a day (QID) | ORAL | Status: DC | PRN
  Filled 2013-07-05: qty 5

## 2013-07-05 MED ORDER — OXYCODONE HCL 5 MG PO TABS: 5.0000 mg | ORAL_TABLET | ORAL | Status: AC | PRN

## 2013-07-05 MED ORDER — ONDANSETRON HCL 4 MG/2ML IJ SOLN
INTRAMUSCULAR | Status: DC | PRN
  Administered 2013-07-05: 4 mg via INTRAVENOUS

## 2013-07-05 MED ORDER — SODIUM CHLORIDE 0.9 % IJ SOLN
9.0000 mL | INTRAMUSCULAR | Status: DC | PRN
Start: 2013-07-05 — End: 2013-07-06

## 2013-07-05 MED ORDER — BUPIVACAINE 0.5 % ON-Q PUMP SINGLE CATH 400 ML
400.0000 mL | INJECTION | Status: DC
  Filled 2013-07-05: qty 400

## 2013-07-05 MED ORDER — PANTOPRAZOLE SODIUM 40 MG PO TBEC
40.0000 mg | DELAYED_RELEASE_TABLET | Freq: Every day | ORAL | Status: DC
  Administered 2013-07-06 – 2013-07-12 (×7): 40 mg via ORAL
  Filled 2013-07-05 (×5): qty 1

## 2013-07-05 MED ORDER — OXYCODONE-ACETAMINOPHEN 5-325 MG PO TABS
1.0000 | ORAL_TABLET | ORAL | Status: DC | PRN
  Administered 2013-07-07 – 2013-07-09 (×2): 1 via ORAL
  Administered 2013-07-10: 2 via ORAL
  Administered 2013-07-10 (×2): 1 via ORAL
  Administered 2013-07-11 – 2013-07-12 (×6): 2 via ORAL
  Administered 2013-07-13: 1 via ORAL
  Administered 2013-07-13: 2 via ORAL
  Filled 2013-07-05 (×2): qty 2
  Filled 2013-07-05: qty 1
  Filled 2013-07-05: qty 2
  Filled 2013-07-05: qty 1
  Filled 2013-07-05 (×2): qty 2
  Filled 2013-07-05: qty 1
  Filled 2013-07-05 (×2): qty 2
  Filled 2013-07-05: qty 1
  Filled 2013-07-05 (×2): qty 2

## 2013-07-05 MED ORDER — KETOROLAC TROMETHAMINE 30 MG/ML IJ SOLN
30.0000 mg | Freq: Four times a day (QID) | INTRAMUSCULAR | Status: AC
Start: 2013-07-05 — End: 2013-07-07
  Administered 2013-07-05 – 2013-07-07 (×8): 30 mg via INTRAVENOUS
  Filled 2013-07-05 (×7): qty 1

## 2013-07-05 MED ORDER — KETOROLAC TROMETHAMINE 30 MG/ML IJ SOLN
INTRAMUSCULAR | Status: AC
  Administered 2013-07-05: 30 mg via INTRAVENOUS
  Filled 2013-07-05: qty 1

## 2013-07-05 MED ORDER — DIVALPROEX SODIUM 250 MG PO DR TAB
250.0000 mg | DELAYED_RELEASE_TABLET | Freq: Every morning | ORAL | Status: DC
  Administered 2013-07-06 – 2013-07-12 (×7): 250 mg via ORAL
  Filled 2013-07-05 (×8): qty 1

## 2013-07-05 MED ORDER — BUPIVACAINE ON-Q PAIN PUMP (FOR ORDER SET NO CHG)
INJECTION | Status: AC
  Filled 2013-07-05: qty 1

## 2013-07-05 MED ORDER — ARTIFICIAL TEARS OP OINT
TOPICAL_OINTMENT | OPHTHALMIC | Status: AC
  Filled 2013-07-05: qty 3.5

## 2013-07-05 MED ORDER — ONDANSETRON HCL 4 MG/2ML IJ SOLN
INTRAMUSCULAR | Status: AC
  Filled 2013-07-05: qty 2

## 2013-07-05 MED ORDER — BISACODYL 5 MG PO TBEC
10.0000 mg | DELAYED_RELEASE_TABLET | Freq: Every day | ORAL | Status: DC
  Administered 2013-07-05 – 2013-07-12 (×7): 10 mg via ORAL
  Filled 2013-07-05 (×7): qty 2

## 2013-07-05 MED ORDER — POTASSIUM CHLORIDE 10 MEQ/50ML IV SOLN: 10.0000 meq | Freq: Every day | INTRAVENOUS | Status: DC | PRN

## 2013-07-05 MED ORDER — ENOXAPARIN SODIUM 40 MG/0.4ML ~~LOC~~ SOLN
40.0000 mg | SUBCUTANEOUS | Status: DC
  Administered 2013-07-06 – 2013-07-12 (×7): 40 mg via SUBCUTANEOUS
  Filled 2013-07-05 (×8): qty 0.4

## 2013-07-05 MED ORDER — DIVALPROEX SODIUM 250 MG PO DR TAB: 750.0000 mg | DELAYED_RELEASE_TABLET | Freq: Two times a day (BID) | ORAL | Status: DC

## 2013-07-05 MED ORDER — ROCURONIUM BROMIDE 100 MG/10ML IV SOLN
INTRAVENOUS | Status: DC | PRN
  Administered 2013-07-05 (×2): 10 mg via INTRAVENOUS
  Administered 2013-07-05: 30 mg via INTRAVENOUS
  Administered 2013-07-05: 10 mg via INTRAVENOUS
  Administered 2013-07-05: 20 mg via INTRAVENOUS
  Administered 2013-07-05 (×2): 10 mg via INTRAVENOUS

## 2013-07-05 MED ORDER — ROCURONIUM BROMIDE 50 MG/5ML IV SOLN
INTRAVENOUS | Status: AC
  Filled 2013-07-05: qty 1

## 2013-07-05 MED ORDER — LIDOCAINE HCL (CARDIAC) 20 MG/ML IV SOLN
INTRAVENOUS | Status: AC
  Filled 2013-07-05: qty 5

## 2013-07-05 MED ORDER — KCL IN DEXTROSE-NACL 20-5-0.9 MEQ/L-%-% IV SOLN
INTRAVENOUS | Status: DC
  Administered 2013-07-05 – 2013-07-06 (×3): via INTRAVENOUS
  Administered 2013-07-07 (×2): 10 mL/h via INTRAVENOUS
  Filled 2013-07-05 (×10): qty 1000

## 2013-07-05 MED ORDER — ACETAMINOPHEN 500 MG PO TABS
1000.0000 mg | ORAL_TABLET | Freq: Four times a day (QID) | ORAL | Status: AC
  Administered 2013-07-05 – 2013-07-06 (×3): 1000 mg via ORAL
  Filled 2013-07-05 (×3): qty 2

## 2013-07-05 MED ORDER — LACTATED RINGERS IV SOLN
INTRAVENOUS | Status: DC | PRN
  Administered 2013-07-05 (×2): via INTRAVENOUS

## 2013-07-05 MED ORDER — OXYCODONE-ACETAMINOPHEN 5-325 MG PO TABS: 1.0000 | ORAL_TABLET | ORAL | Status: DC | PRN

## 2013-07-05 MED ORDER — PHENYLEPHRINE HCL 10 MG/ML IJ SOLN
10.0000 mg | INTRAMUSCULAR | Status: DC | PRN
  Administered 2013-07-05: 15 ug/min via INTRAVENOUS

## 2013-07-05 MED ORDER — DEXTROSE 5 % IV SOLN
1.5000 g | Freq: Two times a day (BID) | INTRAVENOUS | Status: AC
  Administered 2013-07-05 – 2013-07-06 (×2): 1.5 g via INTRAVENOUS
  Filled 2013-07-05 (×2): qty 1.5

## 2013-07-05 MED ORDER — NALOXONE HCL 0.4 MG/ML IJ SOLN: 0.4000 mg | INTRAMUSCULAR | Status: DC | PRN

## 2013-07-05 MED ORDER — NEOSTIGMINE METHYLSULFATE 1 MG/ML IJ SOLN
INTRAMUSCULAR | Status: DC | PRN
  Administered 2013-07-05: 3.5 mg via INTRAVENOUS

## 2013-07-05 MED ORDER — HYDROMORPHONE HCL PF 1 MG/ML IJ SOLN
INTRAMUSCULAR | Status: AC
  Administered 2013-07-05: 0.25 mg via INTRAVENOUS
  Filled 2013-07-05: qty 1

## 2013-07-05 MED ORDER — FENTANYL CITRATE 0.05 MG/ML IJ SOLN
INTRAMUSCULAR | Status: DC | PRN
  Administered 2013-07-05 (×5): 50 ug via INTRAVENOUS
  Administered 2013-07-05: 100 ug via INTRAVENOUS
  Administered 2013-07-05 (×4): 50 ug via INTRAVENOUS
  Administered 2013-07-05: 25 ug via INTRAVENOUS

## 2013-07-05 MED ORDER — MIDAZOLAM HCL 5 MG/5ML IJ SOLN
INTRAMUSCULAR | Status: DC | PRN
  Administered 2013-07-05 (×2): 1 mg via INTRAVENOUS

## 2013-07-05 MED ORDER — HYDROMORPHONE HCL PF 1 MG/ML IJ SOLN
0.2500 mg | INTRAMUSCULAR | Status: DC | PRN
  Administered 2013-07-05 (×4): 0.25 mg via INTRAVENOUS

## 2013-07-05 MED ORDER — LIDOCAINE HCL (CARDIAC) 20 MG/ML IV SOLN
INTRAVENOUS | Status: DC | PRN
  Administered 2013-07-05: 80 mg via INTRAVENOUS

## 2013-07-05 MED ORDER — LACTATED RINGERS IV SOLN
INTRAVENOUS | Status: DC | PRN
  Administered 2013-07-05 (×2): via INTRAVENOUS

## 2013-07-05 MED ORDER — MIDAZOLAM HCL 2 MG/2ML IJ SOLN
INTRAMUSCULAR | Status: AC
  Filled 2013-07-05: qty 2

## 2013-07-05 MED ORDER — DIVALPROEX SODIUM 500 MG PO DR TAB
500.0000 mg | DELAYED_RELEASE_TABLET | Freq: Every evening | ORAL | Status: DC
  Administered 2013-07-05 – 2013-07-12 (×8): 500 mg via ORAL
  Filled 2013-07-05 (×10): qty 1

## 2013-07-05 MED ORDER — ELVITEG-COBIC-EMTRICIT-TENOFDF 150-150-200-300 MG PO TABS
1.0000 | ORAL_TABLET | Freq: Every day | ORAL | Status: DC
  Administered 2013-07-06 – 2013-07-13 (×8): 1 via ORAL
  Filled 2013-07-05 (×11): qty 1

## 2013-07-05 MED ORDER — PROPOFOL 10 MG/ML IV BOLUS
INTRAVENOUS | Status: DC | PRN
  Administered 2013-07-05: 150 mg via INTRAVENOUS

## 2013-07-05 MED ORDER — 0.9 % SODIUM CHLORIDE (POUR BTL) OPTIME
TOPICAL | Status: DC | PRN
  Administered 2013-07-05: 2000 mL

## 2013-07-05 MED ORDER — INSULIN ASPART 100 UNIT/ML ~~LOC~~ SOLN
0.0000 [IU] | Freq: Four times a day (QID) | SUBCUTANEOUS | Status: DC
  Administered 2013-07-05 – 2013-07-07 (×6): 2 [IU] via SUBCUTANEOUS

## 2013-07-05 MED ORDER — DIPHENHYDRAMINE HCL 50 MG/ML IJ SOLN: 12.5000 mg | Freq: Four times a day (QID) | INTRAMUSCULAR | Status: DC | PRN

## 2013-07-05 MED ORDER — ACETAMINOPHEN 160 MG/5ML PO SOLN
1000.0000 mg | Freq: Four times a day (QID) | ORAL | Status: AC
  Administered 2013-07-05: 1000 mg via ORAL
  Filled 2013-07-05 (×4): qty 40

## 2013-07-05 MED ORDER — SENNOSIDES-DOCUSATE SODIUM 8.6-50 MG PO TABS
1.0000 | ORAL_TABLET | Freq: Every evening | ORAL | Status: DC | PRN
  Administered 2013-07-07 – 2013-07-08 (×2): 1 via ORAL
  Filled 2013-07-05 (×2): qty 1

## 2013-07-05 SURGICAL SUPPLY — 93 items
ADH SKN CLS APL DERMABOND .7 (GAUZE/BANDAGES/DRESSINGS) ×1
APL SKNCLS STERI-STRIP NONHPOA (GAUZE/BANDAGES/DRESSINGS) ×1
APPLIER CLIP ROT 10 11.4 M/L (STAPLE) ×2
APR CLP MED LRG 11.4X10 (STAPLE) ×1
BAG SPEC RTRVL LRG 6X4 10 (ENDOMECHANICALS) ×1
BENZOIN TINCTURE PRP APPL 2/3 (GAUZE/BANDAGES/DRESSINGS) ×2 IMPLANT
CANISTER SUCTION 2500CC (MISCELLANEOUS) ×3 IMPLANT
CATH KIT ON Q 5IN SLV (PAIN MANAGEMENT) ×1 IMPLANT
CATH THORACIC 28FR (CATHETERS) ×1 IMPLANT
CATH THORACIC 36FR (CATHETERS) IMPLANT
CATH THORACIC 36FR RT ANG (CATHETERS) IMPLANT
CLIP APPLIE ROT 10 11.4 M/L (STAPLE) IMPLANT
CLIP TI MEDIUM 6 (CLIP) ×2 IMPLANT
CONT SPEC 4OZ CLIKSEAL STRL BL (MISCELLANEOUS) ×16 IMPLANT
COVER SURGICAL LIGHT HANDLE (MISCELLANEOUS) ×2 IMPLANT
DERMABOND ADVANCED (GAUZE/BANDAGES/DRESSINGS) ×1
DERMABOND ADVANCED .7 DNX12 (GAUZE/BANDAGES/DRESSINGS) IMPLANT
DRAIN CHANNEL 28F RND 3/8 FF (WOUND CARE) IMPLANT
DRAIN CHANNEL 32F RND 10.7 FF (WOUND CARE) ×1 IMPLANT
DRAPE LAPAROSCOPIC ABDOMINAL (DRAPES) ×2 IMPLANT
DRAPE WARM FLUID 44X44 (DRAPE) ×2 IMPLANT
ELECT BLADE 6.5 EXT (BLADE) ×1 IMPLANT
ELECT REM PT RETURN 9FT ADLT (ELECTROSURGICAL) ×2
ELECTRODE REM PT RTRN 9FT ADLT (ELECTROSURGICAL) ×1 IMPLANT
GLOVE BIO SURGEON STRL SZ 6.5 (GLOVE) ×1 IMPLANT
GLOVE BIOGEL PI IND STRL 6.5 (GLOVE) IMPLANT
GLOVE BIOGEL PI IND STRL 7.0 (GLOVE) IMPLANT
GLOVE BIOGEL PI INDICATOR 6.5 (GLOVE) ×4
GLOVE BIOGEL PI INDICATOR 7.0 (GLOVE) ×1
GLOVE ECLIPSE 6.5 STRL STRAW (GLOVE) ×1 IMPLANT
GLOVE SURG SIGNA 7.5 PF LTX (GLOVE) ×4 IMPLANT
GOWN PREVENTION PLUS XLARGE (GOWN DISPOSABLE) ×3 IMPLANT
GOWN STRL NON-REIN LRG LVL3 (GOWN DISPOSABLE) ×5 IMPLANT
HANDLE STAPLE ENDO GIA SHORT (STAPLE) ×1
HEMOSTAT SURGICEL 2X14 (HEMOSTASIS) ×1 IMPLANT
KIT BASIN OR (CUSTOM PROCEDURE TRAY) ×2 IMPLANT
KIT ROOM TURNOVER OR (KITS) ×2 IMPLANT
KIT SUCTION CATH 14FR (SUCTIONS) ×2 IMPLANT
NS IRRIG 1000ML POUR BTL (IV SOLUTION) ×4 IMPLANT
PACK CHEST (CUSTOM PROCEDURE TRAY) ×2 IMPLANT
PAD ARMBOARD 7.5X6 YLW CONV (MISCELLANEOUS) ×4 IMPLANT
PENCIL BUTTON HOLSTER BLD 10FT (ELECTRODE) ×1 IMPLANT
POUCH ENDO CATCH II 15MM (MISCELLANEOUS) ×1 IMPLANT
POUCH SPECIMEN RETRIEVAL 10MM (ENDOMECHANICALS) ×1 IMPLANT
RELOAD EGIA 45 MED/THCK PURPLE (STAPLE) ×4 IMPLANT
RELOAD EGIA 45 TAN VASC (STAPLE) ×1 IMPLANT
RELOAD EGIA 60 MED/THCK PURPLE (STAPLE) ×6 IMPLANT
RELOAD EGIA TRIS TAN 45 CVD (STAPLE) ×6 IMPLANT
RELOAD STAPLE 45 3.5 BLU ETS (ENDOMECHANICALS) IMPLANT
RELOAD STAPLE 45 TAN MED CVD (STAPLE) IMPLANT
RELOAD STAPLE 60 BLK XTHK ART (STAPLE) IMPLANT
RELOAD STAPLE 60 MED/THCK ART (STAPLE) IMPLANT
RELOAD STAPLE TA45 3.5 REG BLU (ENDOMECHANICALS) ×2 IMPLANT
RELOAD TRI 2.0 60 XTHK VAS SUL (STAPLE) ×2 IMPLANT
SEALANT PROGEL (MISCELLANEOUS) IMPLANT
SEALANT SURG COSEAL 4ML (VASCULAR PRODUCTS) IMPLANT
SEALANT SURG COSEAL 8ML (VASCULAR PRODUCTS) IMPLANT
SOLUTION ANTI FOG 6CC (MISCELLANEOUS) ×2 IMPLANT
SPECIMEN JAR MEDIUM (MISCELLANEOUS) ×2 IMPLANT
SPONGE GAUZE 4X4 12PLY (GAUZE/BANDAGES/DRESSINGS) ×2 IMPLANT
SPONGE GAUZE 4X4 12PLY STER LF (GAUZE/BANDAGES/DRESSINGS) ×1 IMPLANT
STAPLER ENDO GIA 12 SHRT THIN (STAPLE) IMPLANT
STAPLER ENDO GIA 12MM SHORT (STAPLE) ×1 IMPLANT
STAPLER ENDO NO KNIFE (STAPLE) ×1 IMPLANT
SUT PROLENE 4 0 RB 1 (SUTURE) ×2
SUT PROLENE 4-0 RB1 .5 CRCL 36 (SUTURE) IMPLANT
SUT SILK  1 MH (SUTURE) ×2
SUT SILK 1 MH (SUTURE) ×1 IMPLANT
SUT SILK 2 0SH CR/8 30 (SUTURE) IMPLANT
SUT SILK 3 0SH CR/8 30 (SUTURE) ×1 IMPLANT
SUT VIC AB 0 CTX 27 (SUTURE) IMPLANT
SUT VIC AB 1 CTX 27 (SUTURE) ×1 IMPLANT
SUT VIC AB 2-0 CT1 27 (SUTURE) ×2
SUT VIC AB 2-0 CT1 TAPERPNT 27 (SUTURE) IMPLANT
SUT VIC AB 2-0 CTX 36 (SUTURE) IMPLANT
SUT VIC AB 3-0 MH 27 (SUTURE) IMPLANT
SUT VIC AB 3-0 SH 27 (SUTURE)
SUT VIC AB 3-0 SH 27X BRD (SUTURE) IMPLANT
SUT VIC AB 3-0 X1 27 (SUTURE) ×3 IMPLANT
SUT VICRYL 0 UR6 27IN ABS (SUTURE) ×2 IMPLANT
SUT VICRYL 2 TP 1 (SUTURE) ×1 IMPLANT
SWAB COLLECTION DEVICE MRSA (MISCELLANEOUS) IMPLANT
SYSTEM SAHARA CHEST DRAIN ATS (WOUND CARE) ×2 IMPLANT
TAPE CLOTH SURG 6X10 WHT LF (GAUZE/BANDAGES/DRESSINGS) ×1 IMPLANT
TIP APPLICATOR SPRAY EXTEND 16 (VASCULAR PRODUCTS) IMPLANT
TOWEL OR 17X24 6PK STRL BLUE (TOWEL DISPOSABLE) ×3 IMPLANT
TOWEL OR 17X26 10 PK STRL BLUE (TOWEL DISPOSABLE) ×4 IMPLANT
TRAP SPECIMEN MUCOUS 40CC (MISCELLANEOUS) ×1 IMPLANT
TRAY FOLEY CATH 16FRSI W/METER (SET/KITS/TRAYS/PACK) ×2 IMPLANT
TROCAR XCEL BLADELESS 5X75MML (TROCAR) ×2 IMPLANT
TUBE ANAEROBIC SPECIMEN COL (MISCELLANEOUS) IMPLANT
TUNNELER SHEATH ON-Q 11GX8 DSP (PAIN MANAGEMENT) ×1 IMPLANT
WATER STERILE IRR 1000ML POUR (IV SOLUTION) ×3 IMPLANT

## 2013-07-05 NOTE — Interval H&P Note (Signed)
History and Physical Interval Note:  07/05/2013 7:27 AM  Elizabeth [name]  has presented today for surgery, with the diagnosis of RIGHT UPPER LOBE MASS,   The various methods of treatment have been discussed with the patient and family. After consideration of risks, benefits and other options for treatment, the patient has consented to  Procedure(s) with comments: VIDEO ASSISTED THORACOSCOPY (VATS)/WEDGE RESECTION (Right) - (R)VATS, WEDGE RESECTION, POSSIBLE LOBECTOMY, POSSIBLE CHEST WALL RESECTION as a surgical intervention .  The patient's history has been reviewed, patient examined, no change in status, stable for surgery.  I have reviewed the patient's chart and labs.  Questions were answered to the patient's satisfaction.     [name] C

## 2013-07-05 NOTE — Anesthesia Postprocedure Evaluation (Signed)
  Anesthesia Post-op Note  Patient: Elizabeth [name]  Procedure(s) Performed: Procedure(s) with comments: VIDEO ASSISTED THORACOSCOPY (VATS)/WEDGE RESECTION, Right Upper Lobectomy with lymph node disecction and OnQ placement (Right) - (R)VATS, WEDGE RESECTION, POSSIBLE LOBECTOMY, POSSIBLE CHEST WALL RESECTION  Patient Location: PACU  Anesthesia Type:General  Level of Consciousness: awake, oriented, sedated and patient cooperative  Airway and Oxygen Therapy: Patient Spontanous Breathing  Post-op Pain: moderate  Post-op Assessment: Post-op Vital signs reviewed, Patient's Cardiovascular Status Stable, Respiratory Function Stable, Patent Airway, No signs of Nausea or vomiting and Pain level controlled  Post-op Vital Signs: stable  Complications: No apparent anesthesia complications

## 2013-07-05 NOTE — Anesthesia Preprocedure Evaluation (Addendum)
Anesthesia Evaluation  Patient identified by MRN, date of birth, ID band Patient awake    Reviewed: Allergy & Precautions, H&P , NPO status , Patient's Chart, lab work & pertinent test results  Airway Mallampati: II TM Distance: >3 FB Neck ROM: Full    Dental  (+) Poor Dentition   Pulmonary shortness of breath, asthma , COPDCurrent Smoker,          Cardiovascular     Neuro/Psych    GI/Hepatic   Endo/Other    Renal/GU      Musculoskeletal   Abdominal   Peds  Hematology   Anesthesia Other Findings   Reproductive/Obstetrics                         Anesthesia Physical Anesthesia Plan  ASA: III  Anesthesia Plan: General   Post-op Pain Management:    Induction: Intravenous  Airway Management Planned: Double Lumen EBT  Additional Equipment: Arterial line and CVP  Intra-op Plan:   Post-operative Plan: Possible Post-op intubation/ventilation  Informed Consent:   Dental advisory given  Plan Discussed with: CRNA, Anesthesiologist and Surgeon  Anesthesia Plan Comments:        Anesthesia Quick Evaluation

## 2013-07-05 NOTE — Anesthesia Procedure Notes (Signed)
Procedures RIJ CVP Dual Lumen: [id number]: The patient was identified and consent obtained.  TO was performed, and full barrier precautions were used.  The skin was anesthetized with lidocaine.  Once the vein was located with the 22 ga. needle using ultrasound guidance , the wire was inserted into the vein.  The wire location was confirmed with ultrasound.  The tissue was dilated and the catheter was carefully inserted, then sutured in place. A dressing was applied. The patient tolerated the procedure well.   CE

## 2013-07-05 NOTE — H&P (View-Only) (Signed)
HPI:  43 year old woman sent for evaluation of right upper lobe mass.  Ms. Feeley is a 43 year old woman with a history of HIV and tobacco abuse (25 pack years). She complains of chest pain and shortness of breath. Her chest pain is in the right upper back under her shoulder blade and in the right anterior chest wall. She says this hurts all the time. It is not related to exertion. It is worsened with coughing or taking a deep breath. The pain started about 5 months ago. She also complains of feeling shortness of breath at all times, and difficulty catching her breath due to the pain. She has been taking oxycodone for the pain. She also complains of wheezing.  She was seen in the emergency room in October for shortness of breath. A CT of the chest at that time showed a right upper lobe mass. There also were several other areas in the lungs bilaterally suspicious for pneumonia.   She saw Dr. Melvyn Novas and underwent a bronchoscopic biopsy. It was nondiagnostic. A PET CT showed the lesion to be hypermetabolic. Of note, the other infiltrates in the lung had cleared.   She smokes about one pack of cigarettes a day and has since is 43 years old. She does not want to quit.   She says her appetite has been poor but she gained about 30 pounds over the past 6 months. She attributes this to not being able to be active due to the pain.   She was diagnosed with HIV in 2006. She recently was seen by Dr. Linus Salmons for the first time.  She also complains of frequent seizures. She had 9 seizures in 1 day about a week ago and has been in the ED on multiple occasions for seizure the past couple of months. She says she had followup arranged with a neurologist (she's not sure of the name), but has not followed up with them.  06/22/2013  Mrs. Bennetts returns today after having pulmonary function testing. She continues to have a right upper chest pain. She is taking oxycodone for that. She was seen in the emergency room again with  seizures. She has an appointment to see a neurologist Dr. Lynnette Caffey at [facility] tomorrow.  Past Medical History  Diagnosis Date  . HIV (human immunodeficiency virus infection)   . Seizures   . Substance abuse   . Asthma    Past Surgical History  Procedure Laterality Date  . Cesarean section      x 3  . Video bronchoscopy Bilateral 05/05/2013    Procedure: VIDEO BRONCHOSCOPY WITH FLUORO;  Surgeon: Tanda Rockers, MD;  Location: [facility];  Service: Cardiopulmonary;  Laterality: Bilateral;       Current Outpatient Prescriptions  Medication Sig Dispense Refill  . acetaminophen (TYLENOL) 500 MG tablet Take 500 mg by mouth every 6 (six) hours as needed (pain).      Marland Kitchen albuterol (PROVENTIL HFA;VENTOLIN HFA) 108 (90 BASE) MCG/ACT inhaler Inhale 2 puffs into the lungs every 4 (four) hours as needed for wheezing.  1 Inhaler  0  . divalproex (DEPAKOTE) 250 MG DR tablet Take 3 tablets (750 mg total) by mouth 2 (two) times daily. Take 1 tab in the morning and 2 tabs at night  90 tablet  0  . elvitegravir-cobicistat-emtricitabine-tenofovir (STRIBILD) 150-150-200-300 MG TABS tablet Take 1 tablet by mouth daily with breakfast.  30 tablet  6  . HYDROmorphone (DILAUDID) 4 MG tablet Take 1 tablet (4 mg total) by mouth every  4 (four) hours as needed for severe pain.  30 tablet  0  . oxyCODONE-acetaminophen (PERCOCET/ROXICET) 5-325 MG per tablet Take 1-2 tablets by mouth every 6 (six) hours as needed for severe pain.  100 tablet  0  . QUEtiapine (SEROQUEL) 200 MG tablet Take 200 mg by mouth at bedtime.       No current facility-administered medications for this visit.    Physical Exam BP 111/78  Pulse 92  Resp 20  Ht 5' 1"  (1.549 m)  Wt 150 lb (68.04 kg)  BMI 28.36 kg/m2  SpO2 97%  LMP 06/15/2013 43 year old woman in obvious discomfort, but no acute distress Well-developed well-nourished [place] affect Neurologic alert and oriented x3 with no focal deficits Cardiac regular rate and  rhythm normal S1 and S2 Lungs diminished breath sounds bilaterally, no wheezes No cervical or supraclavicular adenopathy Abdomen benign Extremities no clubbing cyanosis or edema  Diagnostic Tests: Pulmonary function testing 06/29/2013 FVC 2.12 (66%), 2.32 post bronchodilator FEV1 1.33 (48%), 1.46 (53%) post DLCO 63% predicted  ABG 06/29/2013 Room air 7.42/34/77/22/96%  Impression: 43 year old woman with HIV who has a long history of tobacco abuse and COPD. She has a right upper lobe mass which is hypermetabolic by PET. This abuts the chest wall and may be responsible for her chest pain. The pain certainly seems pleuritic in nature.  I recommended that we proceed with right VATS, wedge resection, possible lobectomy and possible chest wall resection. We did discuss the risks at her last visit. She is aware that the risk include, but are not limited to, death, MI, DVT, PE, bleeding, possible need for transfusion, infection, air leak, as well as other organ system dysfunction such as respiratory failure, renal failure, or GI complications. She accepts these risks and wishes to proceed.  She is seeing a neurologist tomorrow. We will await the results of that visit. In the meantime we will plan to proceed with surgery on Monday, February 2 unless there are issues that need to be addressed by neurology.  Plan: Right VATS, wedge resection, possible lobectomy, possible chest wall resection on Monday, February 2.

## 2013-07-05 NOTE — Brief Op Note (Addendum)
07/05/2013  12:14 PM  PATIENT:  Elizabeth [name]  43 y.o. female  PRE-OPERATIVE DIAGNOSIS:  RIGHT UPPER LOBE MASS  POST-OPERATIVE DIAGNOSIS:  RIGHT UPPER LOBE MASS  PROCEDURE:  RIGHT VIDEO ASSISTED THORACOSCOPY (VATS) WEDGE RESECTION OF RIGHT UPPER LOBE THORACOSCOPIC RIGHT UPPER LOBECTOMY MEDIASTINAL LYMPH NODE DISSECTION ON Q CATHETER PLACEMENT  SURGEON:  Surgeon(s) and Role:    * Melrose Nakayama, MD - Primary  PHYSICIAN ASSISTANT: Lars Pinks PA-C   ANESTHESIA:   general  EBL:  Total I/O In: 1800 [I.V.:1800] Out: 616 [Urine:475; Blood:400]  BLOOD ADMINISTERED:none  DRAINS: One 26 French chest tube and a 26 Bard Drain in the right pleural space   LOCAL MEDICATIONS USED:  BUPIVICAINE   SPECIMEN:  Source of Specimen:  Wedge RUL, RUL, multiple lymph nodes  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   Delay start of Pharmacological VTE agent (>24hrs) due to surgical blood loss or risk of bleeding: yes

## 2013-07-05 NOTE — Transfer of Care (Signed)
Immediate Anesthesia Transfer of Care Note  Patient: Elizabeth [name]  Procedure(s) Performed: Procedure(s) with comments: VIDEO ASSISTED THORACOSCOPY (VATS)/WEDGE RESECTION, Right Upper Lobectomy with lymph node disecction and OnQ placement (Right) - (R)VATS, WEDGE RESECTION, POSSIBLE LOBECTOMY, POSSIBLE CHEST WALL RESECTION  Patient Location: PACU  Anesthesia Type:General  Level of Consciousness: awake, alert  and oriented  Airway & Oxygen Therapy: Patient Spontanous Breathing  Post-op Assessment: Report given to PACU RN  Post vital signs: Reviewed and stable  Complications: No apparent anesthesia complications

## 2013-07-05 NOTE — Progress Notes (Addendum)
Right VATS

## 2013-07-06 ENCOUNTER — Encounter (HOSPITAL_COMMUNITY): Payer: Self-pay | Admitting: Thoracic Surgery (Cardiothoracic Vascular Surgery)

## 2013-07-06 ENCOUNTER — Inpatient Hospital Stay (HOSPITAL_COMMUNITY): Payer: Medicaid Other

## 2013-07-06 LAB — BLOOD GAS, ARTERIAL
ACID-BASE EXCESS: 3.4 mmol/L — AB (ref 0.0–2.0)
Bicarbonate: 27.9 mEq/L — ABNORMAL HIGH (ref 20.0–24.0)
Drawn by: 39899
O2 CONTENT: 3 L/min
O2 Saturation: 94.2 %
PCO2 ART: 44.9 mmHg (ref 35.0–45.0)
PO2 ART: 65.5 mmHg — AB (ref 80.0–100.0)
Patient temperature: 97.6
TCO2: 29.3 mmol/L (ref 0–100)
pH, Arterial: 7.407 (ref 7.350–7.450)

## 2013-07-06 LAB — GLUCOSE, CAPILLARY
GLUCOSE-CAPILLARY: 120 mg/dL — AB (ref 70–99)
GLUCOSE-CAPILLARY: 122 mg/dL — AB (ref 70–99)
GLUCOSE-CAPILLARY: 125 mg/dL — AB (ref 70–99)
Glucose-Capillary: 156 mg/dL — ABNORMAL HIGH (ref 70–99)

## 2013-07-06 LAB — BASIC METABOLIC PANEL
BUN: 10 mg/dL (ref 6–23)
CHLORIDE: 104 meq/L (ref 96–112)
CO2: 26 meq/L (ref 19–32)
Calcium: 8.4 mg/dL (ref 8.4–10.5)
Creatinine, Ser: 0.67 mg/dL (ref 0.50–1.10)
GFR calc Af Amer: >90 mL/min (ref >90)
GFR calc non Af Amer: >90 mL/min (ref >90)
GLUCOSE: 116 mg/dL — AB (ref 70–99)
POTASSIUM: 4.3 meq/L (ref 3.7–5.3)
SODIUM: 141 meq/L (ref 137–147)

## 2013-07-06 LAB — CBC
HCT: 32.8 % — ABNORMAL LOW (ref 36.0–46.0)
HEMOGLOBIN: 11.2 g/dL — AB (ref 12.0–15.0)
MCH: 29.9 pg (ref 26.0–34.0)
MCHC: 34.1 g/dL (ref 30.0–36.0)
MCV: 87.7 fL (ref 78.0–100.0)
Platelets: 236 10*3/uL (ref 150–400)
RBC: 3.74 MIL/uL — ABNORMAL LOW (ref 3.87–5.11)
RDW: 14.1 % (ref 11.5–15.5)
WBC: 6.6 10*3/uL (ref 4.0–10.5)

## 2013-07-06 MED ORDER — SODIUM CHLORIDE 0.9 % IJ SOLN: 9.0000 mL | INTRAMUSCULAR | Status: DC | PRN

## 2013-07-06 MED ORDER — DIPHENHYDRAMINE HCL 12.5 MG/5ML PO ELIX
12.5000 mg | ORAL_SOLUTION | Freq: Four times a day (QID) | ORAL | Status: DC | PRN
  Filled 2013-07-06: qty 5

## 2013-07-06 MED ORDER — DIPHENHYDRAMINE HCL 50 MG/ML IJ SOLN: 12.5000 mg | Freq: Four times a day (QID) | INTRAMUSCULAR | Status: DC | PRN

## 2013-07-06 MED ORDER — NALOXONE HCL 0.4 MG/ML IJ SOLN: 0.4000 mg | INTRAMUSCULAR | Status: DC | PRN

## 2013-07-06 MED ORDER — ONDANSETRON HCL 4 MG/2ML IJ SOLN: 4.0000 mg | Freq: Four times a day (QID) | INTRAMUSCULAR | Status: DC | PRN

## 2013-07-06 MED ORDER — FENTANYL 10 MCG/ML IV SOLN
INTRAVENOUS | Status: DC
  Filled 2013-07-06: qty 50

## 2013-07-06 MED ORDER — ALBUTEROL SULFATE (2.5 MG/3ML) 0.083% IN NEBU: 3.0000 mL | INHALATION_SOLUTION | RESPIRATORY_TRACT | Status: DC | PRN

## 2013-07-06 MED ORDER — HYDROMORPHONE 0.3 MG/ML IV SOLN
INTRAVENOUS | Status: DC
  Administered 2013-07-06: 0.9 mg via INTRAVENOUS
  Administered 2013-07-06: 12:00:00 via INTRAVENOUS
  Administered 2013-07-06: 0.6 mg via INTRAVENOUS
  Administered 2013-07-06: 0.799 mg via INTRAVENOUS
  Administered 2013-07-07: 1.59 mg via INTRAVENOUS
  Administered 2013-07-07: 1.39 mg via INTRAVENOUS
  Administered 2013-07-07: 22:00:00 via INTRAVENOUS
  Administered 2013-07-07: 1.19 mg via INTRAVENOUS
  Administered 2013-07-07: 1.39 mg via INTRAVENOUS
  Administered 2013-07-07: 1.38 mg via INTRAVENOUS
  Administered 2013-07-07: 3.39 mg via INTRAVENOUS
  Administered 2013-07-08: 1.19 mg via INTRAVENOUS
  Administered 2013-07-08: 1.39 mg via INTRAVENOUS
  Administered 2013-07-08: 1.51 mg via INTRAVENOUS
  Administered 2013-07-08: 1.59 mg via INTRAVENOUS
  Administered 2013-07-08: 0.799 mg via INTRAVENOUS
  Administered 2013-07-08: 0.2 mg via INTRAVENOUS
  Administered 2013-07-09: 1.59 mg via INTRAVENOUS
  Administered 2013-07-09: 2.59 mg via INTRAVENOUS
  Administered 2013-07-09: 0.4 mg via INTRAVENOUS
  Administered 2013-07-09: 1.59 mg via INTRAVENOUS
  Administered 2013-07-09: 2.99 mg via INTRAVENOUS
  Administered 2013-07-09: 1.59 mg via INTRAVENOUS
  Administered 2013-07-09 (×2): via INTRAVENOUS
  Administered 2013-07-09: 1.19 mg via INTRAVENOUS
  Administered 2013-07-10: 0.2 mg via INTRAVENOUS
  Administered 2013-07-10: 1.79 mg via INTRAVENOUS
  Administered 2013-07-10: 3.37 mg via INTRAVENOUS
  Administered 2013-07-10: 13:00:00 via INTRAVENOUS
  Administered 2013-07-10: 1.19 mg via INTRAVENOUS
  Administered 2013-07-11: 0.599 mg via INTRAVENOUS
  Administered 2013-07-11: 2.63 mg via INTRAVENOUS
  Administered 2013-07-11: 0.6 mg via INTRAVENOUS
  Administered 2013-07-11: 0.67 mg via INTRAVENOUS
  Administered 2013-07-11: 2 mg via INTRAVENOUS
  Administered 2013-07-11: 1.2 mg via INTRAVENOUS
  Administered 2013-07-12: 3.59 mg via INTRAVENOUS
  Filled 2013-07-06 (×7): qty 25

## 2013-07-06 MED ORDER — FENTANYL 10 MCG/ML IV SOLN
INTRAVENOUS | Status: DC
  Administered 2013-07-06: 30 ug via INTRAVENOUS
  Administered 2013-07-06: 60 ug via INTRAVENOUS
  Filled 2013-07-06: qty 50

## 2013-07-06 MED ORDER — DIPHENHYDRAMINE HCL 12.5 MG/5ML PO ELIX: 12.5000 mg | ORAL_SOLUTION | Freq: Four times a day (QID) | ORAL | Status: DC | PRN

## 2013-07-06 NOTE — Progress Notes (Signed)
Utilization review completed.  

## 2013-07-06 NOTE — Progress Notes (Addendum)
Patient lethargic and  hard to arouse on dialudid PCA 0.3/8/5 settings. Adjusted settings to Low dose 0.2/8/5 in order to encourage more independent  activity and interaction from patient. See echarting for additional data

## 2013-07-06 NOTE — Progress Notes (Addendum)
Drug-Drug Interaction Report  Fentanyl and Related / Protease Inhibitors and Cobicistat  Significance: Major  Warning: Pharmacologic effects and plasma concentrations of fentaNYL may be increased by elvitegravir-cobicistat-emtricitabine-tenofovir. Severe respiratory depression may occur.  Onset: Rapid Document Level: Suspected  Interacting Medications/Orders:  Fentanyl and Related Systemic and Topical, administered by various routes Protease Inhibitors and Cobicistat Oral, Systemic  1. fentaNYL Order ([id number]): fentaNYL 10 mcg/mL PCA injection Route: Intravenous Start: 07/06/2013 0815 End: none Frequency: Every 4 hours (PCA) 1. elvitegravir-cobicistat-emtricitabine-tenofovir Order ([id number]): elvitegravir-cobicistat-emtricitabine-tenofovir (STRIBILD) 150-150-200-300 MG tablet 1 tablet Route: Oral Start: 07/06/2013 0700 End: none Frequency: Daily with breakfast   Recommendation: 1.  Consider alternative PCA pain meds (Morphine or Dilaudid) that is not impacted by the CYP3A4 isoenzymes. 2.  At minimum would monitor very closely for drug accumulation and increased CNS effect.  Effects: elvitegravir-cobicistat-emtricitabine-tenofovir may increase plasma concentrations and pharmacologic effects of fentaNYL. Severe respiratory depression may occur.  Mechanism: Inhibition of CYP3A4 isoenzymes by elvitegravir-cobicistat-emtricitabine-tenofovir may decrease the metabolic elimination of fentaNYL. Other mechanism may exist.  Management: Additional clinical monitoring is indicated; adjust the dosage of fentaNYL accordingly.  Rober Minion, PharmD., MS Clinical Pharmacist Pager:  [phone] Thank you for allowing pharmacy to be part of this patients care team.

## 2013-07-06 NOTE — Progress Notes (Addendum)
      [address]       [place] [address]             [phone]       1 Day Post-Op Procedure(s) (LRB): VIDEO ASSISTED THORACOSCOPY (VATS)/WEDGE RESECTION, Right Upper Lobectomy with lymph node disecction and OnQ placement (Right)  Subjective: Patient is very somnolent this am. She is somewhat arousable and was able to follow commands.  Objective: Vital signs in last 24 hours: Temp:  [97.1 F (36.2 C)-98.7 F (37.1 C)] 97.6 F (36.4 C) (02/03 0425) Pulse Rate:  [93-110] 93 (02/03 0700) Cardiac Rhythm:  [-] Sinus tachycardia (02/03 0425) Resp:  [10-26] 17 (02/03 0700) BP: (92-147)/(52-86) 92/59 mmHg (02/03 0700) SpO2:  [84 %-100 %] 94 % (02/03 0700) Arterial Line BP: (96-172)/(55-89) 96/55 mmHg (02/03 0700) Weight:  [74.7 kg (164 lb 10.9 oz)] 74.7 kg (164 lb 10.9 oz) (02/02 1630)     Intake/Output from previous day: 02/02 0701 - 02/03 0700 In: 3677 [P.O.:600; I.V.:2977; IV Piggyback:100] Out: 3125 [Urine:1975; Blood:400; Chest Tube:750]   Physical Exam:  Cardiovascular: RRR Pulmonary: Diminished at bases; no rales, wheezes, or rhonchi. Abdomen: Soft, non tender, bowel sounds present. Extremities: No lower extremity edema. Wounds: Dressing is clean and dry.   Chest Tubes: to suction and no air leak  Lab Results: CBC: Recent Labs  07/06/13 0450  WBC 6.6  HGB 11.2*  HCT 32.8*  PLT 236   BMET:  Recent Labs  07/06/13 0450  NA 141  K 4.3  CL 104  CO2 26  GLUCOSE 116*  BUN 10  CREATININE 0.67  CALCIUM 8.4    PT/INR: No results found for this basename: LABPROT, INR,  in the last 72 hours ABG:  INR: Will add last result for INR, ABG once components are confirmed Will add last 4 CBG results once components are confirmed  Assessment/Plan:  1. CV - SR 2.  Pulmonary - Chest tubes with 750 cc since surgery. CTs to suction and there is no air leak. Leave chest tubes for now. CXR appears to show no pneumothorax, bibasilar atelectasis.  Final pathology is pending. Encourage incentive spirometer. 3. Mild ABL anemia- H and H 11.2 and 32.8 4 Remove a line 5.Decrease IVF once tolerating diet 6. Change PCA to reduced dose. May need to discontinue if still somnolent. On scheduled Toradol as well.May need Narcan.  ZIMMERMAN,DONIELLE MPA-C 07/06/2013,7:43 AM  Will change PCA from fentanyl to dilaudid per pharmacy recommendation No air leak- CT to water seal Mobilize SCD + enoxaparin for DVT prophylaxis

## 2013-07-06 NOTE — Progress Notes (Signed)
Discontinued and wasted 135mg fentanyl in sharps with REustace MooreRN

## 2013-07-06 NOTE — Op Note (Signed)
NAMEJAYLAN, DUGGAR NO.:  [id number]  MEDICAL RECORD NO.:  [id number]  LOCATION:  3S06C                        FACILITY:  [facility]  PHYSICIAN:  Revonda Standard. Roxan Hockey, M.D.DATE OF BIRTH:  Sep 06, 1970  DATE OF PROCEDURE:  07/05/2013 DATE OF DISCHARGE:                              OPERATIVE REPORT   PREOPERATIVE DIAGNOSIS:  Right upper lobe mass.  POSTOPERATIVE DIAGNOSIS:  Non-small cell cancer, right upper lobe.  PROCEDURE:  Right video-assisted thoracoscopy  Wedge resection of right upper lobe mass with extrapleural dissection,  Thoracoscopic right upper lobectomy,  Mediastinal lymph node dissection,  On-Q local anesthetic catheter placement.  SURGEON:  Revonda Standard. Roxan Hockey, MD  ASSISTANT:  Lars Pinks, PA  ANESTHESIA:  General.  FINDINGS:  Mass in the lateral aspect of the right upper lobe, adhesions to the chest wall which for the most part were thin and filmy adhesions although directly in apposition to the mass they were more dense taken down along with intercostal musculature in extrapleural fashion in this one area.  No definite chest wall invasion.  Frozen section revealed non- small-cell carcinoma.  CLINICAL NOTE:  Ms. Blok is a 43 year old woman with a long history of tobacco abuse, HIV, and a history of polysubstance abuse.  She recently was found to have a right upper lobe mass.  PET scan showed this lesion was hypermetabolic.  The lesion abutted the chest wall and she did have right-sided chest pain.  She was advised to undergo a right VATS with wedge resection, possible lobectomy, and possible chest wall resection. The indications, risks, benefits, and alternatives were discussed in detail with the patient.  She understood and accepted the risks and agreed to proceed.  OPERATIVE NOTE:  Ms. Lucy was brought to the preoperative holding area on July 05, 2013.  There Anesthesia placed a central line and arterial blood pressure  monitoring line.  She was taken to the operating room, anesthetized, and intubated with a double-lumen endotracheal tube. Intravenous antibiotics were administered.  PAS hose were placed for DVT prophylaxis.  A Foley catheter was placed.  She was placed in a left lateral decubitus position, and the right chest was prepped and draped in usual sterile fashion.  Single lung ventilation of the left lung was initiated and was tolerated well throughout the procedure.  An incision was made at approximately the seventh intercostal space in the midaxillary line and was carried through the skin and subcutaneous tissue.  The chest was entered bluntly using a 5-mm port.  A 5-mm thoracoscope was placed in the chest.  There was good isolation of the right lung.  A small utility incision was made anterolaterally.  The right upper lobe was inspected.  There were some thin filmy adhesions to the chest wall.  These were taken down with electrocautery.  As the mass was neared the adhesions were less filmy and more suspicious for tumor.  The dissection was carried extrapleural at this area and some of the intracostal musculature was taken with the specimen to ensure adequate margins.  There was no definite tumor invasion.  After mobilizing the tumor, a wedge resection was performed with sequential firings of an endoscopic GIA staplers,  2 purple cartridges and 2 black cartridges were used.  The specimen was placed in an endoscopic retrieval bag and removed through the anterior incision.  It was sent for frozen section which ultimately returned non-small-cell carcinoma. The margin was clear. The plan was to proceed with a lobectomy to give the best chance for cure.   The inferior ligament was divided with electrocautery.  Level 8 and 9 nodes were identified and were taken as separate specimens.  The pleural reflection was divided anteriorly at the hilum.  The minor fissure was incomplete, the major fissure was  almost entirely complete.  The middle lobe pulmonary vein was identified and the upper lobe branches of the superior pulmonary vein were dissected out.  Adjacent nodes were taken as part of this dissection.  All lymph nodes that were taken were sent as separate specimens for permanent pathology.  The upper lobe vein branches were encircled. This took some time because of the angulation of the more superior branch, but ultimately they were encircled and then divided with an endoscopic vascular stapler.  This allowed exposure of the anterior and apical pulmonary artery segmental branches.  These were relatively separated and were taken separately with the endoscopic stapler.  Next, attention was turned back to the fissure.  Again, the major fissure between the superior segment of the right upper lobe was essentially complete.  The pulmonary artery was visible, it was dissected out and 2 posterior ascending branches were noted to the right upper lobe.  These were associated with nodes which were dissected out to allow the arteries to be encircled.  They were divided with a single firing of the endoscopic vascular stapler.  Next, the minor fissure was completed with sequential firings with an endoscopic stapler again using purple cartridges for the lung tissue.  The pleural reflection was divided at the hilum posteriorly and superiorly.  The lymph nodes were dissected off the right upper lobe bronchus.  Endoscopic GIA stapler with a 45-mm purple cartridge was placed across the bronchus and closed but not fired.  A test inflation showed good aeration of the lower and middle lobes.  The stapler then was fired dividing the right upper lobe bronchus.  The specimen was placed into large endoscopic retrieval bag and removed through the port incision.  Next, a systematic inspection was made for lymph nodes.  Level 10 nodes were identified.  There was a relatively enlarged node that was sent  for permanent pathology.  The right paratracheal space was inspected and there were multiple nodes in this area.  These 4 R nodes were sent separately.  Finally, the level 7 subcarinal nodes were dissected out and sent for permanent pathology.  Surgicel was applied to these areas.  The chest was copiously irrigated with warm saline.  A test inflation revealed no air leak from the bronchial stump.  An On-Q local anesthetic catheter was tunneled into a subpleural location through a separate stab wound posteriorly and secured at the skin with a 3-0 silk suture.  The frozen on the lobectomy specimen came back with clear margins at the bronchial stump.  A 32-French Blake drain was placed through the original port incision and directed posteriorly, and a 28-French chest tube was placed through a second port incision and directed anteriorly and superiorly. These were secured with #1 silk sutures.  No rib spreading was performed during the procedure.  The lower and middle lobes were again re-inflated.  The utility incision was closed with a #1 Vicryl  fascial suture followed by a 2-0 Vicryl subcutaneous suture and a 3-0 Vicryl subcuticular suture.  The chest tubes were placed to Pleur-Evac suction.  The patient was extubated in the operating room and taken to the postanesthetic care unit in good condition.     Revonda Standard Roxan Hockey, M.D.     SCH/MEDQ  D:  07/05/2013  T:  07/06/2013  Job:  [id number]

## 2013-07-07 ENCOUNTER — Encounter: Payer: Self-pay | Admitting: Internal Medicine

## 2013-07-07 ENCOUNTER — Other Ambulatory Visit: Payer: Self-pay | Admitting: *Deleted

## 2013-07-07 ENCOUNTER — Inpatient Hospital Stay (HOSPITAL_COMMUNITY): Payer: Medicaid Other

## 2013-07-07 LAB — COMPREHENSIVE METABOLIC PANEL
ALBUMIN: 2.2 g/dL — AB (ref 3.5–5.2)
ALT: 5 U/L (ref 0–35)
AST: 12 U/L (ref 0–37)
Alkaline Phosphatase: 42 U/L (ref 39–117)
BILIRUBIN TOTAL: 0.3 mg/dL (ref 0.3–1.2)
BUN: 7 mg/dL (ref 6–23)
CHLORIDE: 107 meq/L (ref 96–112)
CO2: 26 meq/L (ref 19–32)
Calcium: 8 mg/dL — ABNORMAL LOW (ref 8.4–10.5)
Creatinine, Ser: 0.65 mg/dL (ref 0.50–1.10)
GFR calc Af Amer: >90 mL/min (ref >90)
Glucose, Bld: 98 mg/dL (ref 70–99)
Potassium: 4.5 mEq/L (ref 3.7–5.3)
Sodium: 141 mEq/L (ref 137–147)
Total Protein: 5.4 g/dL — ABNORMAL LOW (ref 6.0–8.3)

## 2013-07-07 LAB — CBC
HCT: 28.9 % — ABNORMAL LOW (ref 36.0–46.0)
Hemoglobin: 9.8 g/dL — ABNORMAL LOW (ref 12.0–15.0)
MCH: 30.6 pg (ref 26.0–34.0)
MCHC: 33.9 g/dL (ref 30.0–36.0)
MCV: 90.3 fL (ref 78.0–100.0)
PLATELETS: 213 10*3/uL (ref 150–400)
RBC: 3.2 MIL/uL — AB (ref 3.87–5.11)
RDW: 14.3 % (ref 11.5–15.5)
WBC: 5.9 10*3/uL (ref 4.0–10.5)

## 2013-07-07 LAB — GLUCOSE, CAPILLARY
GLUCOSE-CAPILLARY: 129 mg/dL — AB (ref 70–99)
Glucose-Capillary: 123 mg/dL — ABNORMAL HIGH (ref 70–99)

## 2013-07-07 MED ORDER — IPRATROPIUM-ALBUTEROL 0.5-2.5 (3) MG/3ML IN SOLN
3.0000 mL | Freq: Four times a day (QID) | RESPIRATORY_TRACT | Status: DC
  Administered 2013-07-07 – 2013-07-13 (×24): 3 mL via RESPIRATORY_TRACT
  Filled 2013-07-07 (×23): qty 3

## 2013-07-07 MED ORDER — IPRATROPIUM-ALBUTEROL 20-100 MCG/ACT IN AERS: 2.0000 | INHALATION_SPRAY | Freq: Four times a day (QID) | RESPIRATORY_TRACT | Status: DC

## 2013-07-07 NOTE — Progress Notes (Signed)
2 Days Post-Op Procedure(s) (LRB): VIDEO ASSISTED THORACOSCOPY (VATS)/WEDGE RESECTION, Right Upper Lobectomy with lymph node disecction and OnQ placement (Right) Subjective: Feels better today but still with significant pain  Objective: Vital signs in last 24 hours: Temp:  [97.4 F (36.3 C)-98.6 F (37 C)] 98 F (36.7 C) (02/04 0753) Pulse Rate:  [95-119] 105 (02/04 0338) Cardiac Rhythm:  [-] Sinus tachycardia (02/04 0338) Resp:  [13-27] 27 (02/04 0338) BP: (98-131)/(66-94) 103/66 mmHg (02/04 0338) SpO2:  [91 %-97 %] 93 % (02/04 0338) Arterial Line BP: (113-141)/(58-76) 113/58 mmHg (02/03 1400)  Hemodynamic parameters for last 24 hours:    Intake/Output from previous day: 02/03 0701 - 02/04 0700 In: 1552.4 [P.O.:440; I.V.:1112.4] Out: 750 [Urine:475; Chest Tube:275] Intake/Output this shift:    General appearance: more alert, no distress Neurologic: intact Heart: regular rate and rhythm Lungs: diminished breath sounds bibasilar Abdomen: normal findings: soft, non-tender no air leak, serous drainage  Lab Results:  Recent Labs  07/06/13 0450 07/07/13 0225  WBC 6.6 5.9  HGB 11.2* 9.8*  HCT 32.8* 28.9*  PLT 236 213   BMET:  Recent Labs  07/06/13 0450 07/07/13 0225  NA 141 141  K 4.3 4.5  CL 104 107  CO2 26 26  GLUCOSE 116* 98  BUN 10 7  CREATININE 0.67 0.65  CALCIUM 8.4 8.0*    PT/INR: No results found for this basename: LABPROT, INR,  in the last 72 hours ABG    Component Value Date/Time   PHART 7.407 07/06/2013 0425   HCO3 27.9* 07/06/2013 0425   TCO2 29.3 07/06/2013 0425   ACIDBASEDEF 1.6 06/08/2013 1454   O2SAT 94.2 07/06/2013 0425   CBG (last 3)   Recent Labs  07/06/13 1222 07/06/13 1834 07/06/13 2353  GLUCAP 122* 156* 123*    Assessment/Plan: S/P Procedure(s) (LRB): VIDEO ASSISTED THORACOSCOPY (VATS)/WEDGE RESECTION, Right Upper Lobectomy with lymph node disecction and OnQ placement (Right) - CV- stable  RESP- no air leak - dc anterior  CT  + WHEEZING- combivent QID  IS, add flutter for atelectasis  RENAL- lytes, creatinine OK  ENDO- CBG OK, dc CBG, SSI  DVT- SCD + enoxaparin  Ambulate    LOS: 2 days    Elizabeth [name] C 07/07/2013

## 2013-07-08 ENCOUNTER — Inpatient Hospital Stay (HOSPITAL_COMMUNITY): Payer: Medicaid Other

## 2013-07-08 LAB — BASIC METABOLIC PANEL
BUN: 5 mg/dL — ABNORMAL LOW (ref 6–23)
CO2: 30 meq/L (ref 19–32)
Calcium: 8.4 mg/dL (ref 8.4–10.5)
Chloride: 101 mEq/L (ref 96–112)
Creatinine, Ser: 0.67 mg/dL (ref 0.50–1.10)
GFR calc Af Amer: >90 mL/min (ref >90)
GLUCOSE: 122 mg/dL — AB (ref 70–99)
POTASSIUM: 4.1 meq/L (ref 3.7–5.3)
Sodium: 140 mEq/L (ref 137–147)

## 2013-07-08 LAB — URINALYSIS, ROUTINE W REFLEX MICROSCOPIC
BILIRUBIN URINE: NEGATIVE
Glucose, UA: NEGATIVE mg/dL
HGB URINE DIPSTICK: NEGATIVE
Ketones, ur: NEGATIVE mg/dL
Leukocytes, UA: NEGATIVE
Nitrite: NEGATIVE
PH: 7 (ref 5.0–8.0)
Protein, ur: NEGATIVE mg/dL
SPECIFIC GRAVITY, URINE: 1.008 (ref 1.005–1.030)
Urobilinogen, UA: 1 mg/dL (ref 0.0–1.0)

## 2013-07-08 LAB — CBC
HCT: 28.8 % — ABNORMAL LOW (ref 36.0–46.0)
HEMOGLOBIN: 9.5 g/dL — AB (ref 12.0–15.0)
MCH: 30 pg (ref 26.0–34.0)
MCHC: 33 g/dL (ref 30.0–36.0)
MCV: 90.9 fL (ref 78.0–100.0)
Platelets: 233 10*3/uL (ref 150–400)
RBC: 3.17 MIL/uL — AB (ref 3.87–5.11)
RDW: 14.3 % (ref 11.5–15.5)
WBC: 7.1 10*3/uL (ref 4.0–10.5)

## 2013-07-08 LAB — GLUCOSE, CAPILLARY
Glucose-Capillary: 129 mg/dL — ABNORMAL HIGH (ref 70–99)
Glucose-Capillary: 96 mg/dL (ref 70–99)

## 2013-07-08 MED ORDER — ENSURE COMPLETE PO LIQD
237.0000 mL | Freq: Three times a day (TID) | ORAL | Status: DC
  Administered 2013-07-08 – 2013-07-12 (×9): 237 mL via ORAL

## 2013-07-08 MED ORDER — ACETAMINOPHEN 325 MG PO TABS
650.0000 mg | ORAL_TABLET | ORAL | Status: DC | PRN
  Administered 2013-07-08: 650 mg via ORAL
  Filled 2013-07-08: qty 2

## 2013-07-08 NOTE — Progress Notes (Addendum)
      [address]       [place] [address]             [phone]       3 Days Post-Op Procedure(s) (LRB): VIDEO ASSISTED THORACOSCOPY (VATS)/WEDGE RESECTION, Right Upper Lobectomy with lymph node disecction and OnQ placement (Right)  Subjective: Patient receiving breathing treatment this morning. Complaint are incisional pain and no appetite.  Objective: Vital signs in last 24 hours: Temp:  [98.2 F (36.8 C)-101.5 F (38.6 C)] 101.5 F (38.6 C) (02/05 0759) Pulse Rate:  [104-122] 110 (02/05 0328) Cardiac Rhythm:  [-] Sinus tachycardia (02/05 0328) Resp:  [13-28] 26 (02/05 0328) BP: (101-127)/(59-83) 101/62 mmHg (02/05 0328) SpO2:  [92 %-100 %] 92 % (02/05 0328)     Intake/Output from previous day: 02/04 0701 - 02/05 0700 In: 612 [P.O.:120; I.V.:492] Out: 1115 [Urine:600; Chest Tube:515]   Physical Exam:  Cardiovascular: RRR Pulmonary: Diminished at bases; no rales, wheezes, or rhonchi. Abdomen: Soft, non tender, bowel sounds present. Extremities: No lower extremity edema. Wounds: Dressing is clean and dry.   Chest Tube: to water seal  Lab Results: CBC:  Recent Labs  07/07/13 0225 07/08/13 0415  WBC 5.9 7.1  HGB 9.8* 9.5*  HCT 28.9* 28.8*  PLT 213 233   BMET:   Recent Labs  07/07/13 0225 07/08/13 0415  NA 141 140  K 4.5 4.1  CL 107 101  CO2 26 30  GLUCOSE 98 122*  BUN 7 5*  CREATININE 0.65 0.67  CALCIUM 8.0* 8.4    PT/INR: No results found for this basename: LABPROT, INR,  in the last 72 hours ABG:  INR: Will add last result for INR, ABG once components are confirmed Will add last 4 CBG results once components are confirmed  Assessment/Plan:  1. CV - SR/ST with HR in the 110's. Increased HR may secondary to pain or infection (had fever to 101.5) 2.  Pulmonary - Chest tube with 515 cc last 24 hours. CT to water seal and there is tidling with cough but no obvious air leak.CXR appears stable ( questionable trace right  apical pneumothorax, bibasilar atelectasis.)  Receiving Duo neb this am.Encourage incentive spirometer. 3. Mild ABL anemia- H and H 9.5 and 28.8 4. Remove On Q today 6. Ensure tid 7. Had fever to 101.5. No signs of wound infection. Will check UA and monitor x ray for pulmonary etiology. May need to remove central line and consider checking blood cultures.  ZIMMERMAN,DONIELLE MPA-C 07/08/2013,8:11 AM  Patient seen and examined, agree with above More alert but complaining more of pain Path- T2N0- stage IB- dsicussed at [facility]- no adjuvant therapy  Pt informed of path Fever likely secondary to atelectasis

## 2013-07-08 NOTE — Progress Notes (Addendum)
Pt given PO tylenol and Right IJ removed per order. Will cont to monitor.

## 2013-07-09 ENCOUNTER — Inpatient Hospital Stay (HOSPITAL_COMMUNITY): Payer: Medicaid Other

## 2013-07-09 MED ORDER — LACTULOSE 10 GM/15ML PO SOLN
20.0000 g | Freq: Once | ORAL | Status: AC
  Administered 2013-07-09: 20 g via ORAL
  Filled 2013-07-09: qty 30

## 2013-07-09 MED ORDER — GUAIFENESIN ER 600 MG PO TB12
1200.0000 mg | ORAL_TABLET | Freq: Two times a day (BID) | ORAL | Status: DC
  Administered 2013-07-09 – 2013-07-12 (×8): 1200 mg via ORAL
  Filled 2013-07-09 (×10): qty 2

## 2013-07-09 MED ORDER — METOPROLOL TARTRATE 25 MG PO TABS
25.0000 mg | ORAL_TABLET | Freq: Two times a day (BID) | ORAL | Status: DC
  Administered 2013-07-09 – 2013-07-11 (×5): 25 mg via ORAL
  Filled 2013-07-09 (×6): qty 1

## 2013-07-09 MED ORDER — LEVOFLOXACIN 500 MG PO TABS
500.0000 mg | ORAL_TABLET | Freq: Every day | ORAL | Status: DC
  Administered 2013-07-09 – 2013-07-12 (×4): 500 mg via ORAL
  Filled 2013-07-09 (×5): qty 1

## 2013-07-09 NOTE — Progress Notes (Signed)
Utilization review completed.  

## 2013-07-09 NOTE — Discharge Summary (Signed)
Physician Discharge Summary       [address]       [place] [address]             [phone]    Patient ID: Elizabeth [name] MRN: [id number] DOB/AGE: August 29, 1970 43 y.o.  Admit date: 07/05/2013 Discharge date: 07/13/2013  Admission Diagnoses: 1. RUL mass 2. History of HIV 3. History of seizures 4. History of substance abuse 5. History of asthma  Discharge Diagnoses:  1. Stage IB lung cancer- T2N0 adenocarcinoma 2. History of HIV 3. History of seizures 4. History of substance abuse 5. History of asthma   Procedure (s):  Right video-assisted thoracoscopy, wedge resection, right  upper lobe mass with extrapleural dissection, thoracoscopic right upper  lobectomy, mediastinal lymph node dissection, On-Q local anesthetic  catheter placement by Dr. Roxan Hockey on 07/05/2013.  Pathology: Diagnosis 1. Lung, wedge biopsy/resection, Right upper lobe - INVASIVE ADENOCARCINOMA, SEE COMMENT. - NO LYMPHOVASCULAR INVASION IDENTIFIED. - TUMOR INVOLVES VISCERAL PLEURA (PENDING STAINS) - TUMOR IS 1 CM FROM THE SURGICAL MARGIN - SEE TUMOR SYNOPTIC TEMPLATE BELOW. 2. Lymph node, biopsy, 11 R - ONE LYMPH NODE, NEGATIVE FOR TUMOR (0/1). 3. Lymph node, biopsy, 11 R #2 - ONE LYMPH NODE, NEGATIVE FOR TUMOR (0/1). 4. Lymph node, biopsy, 10 R - ONE LYMPH NODE, NEGATIVE FOR TUMOR (0/1). 5. Lymph node, biopsy, 4 R - ONE LYMPH NODE, NEGATIVE FOR TUMOR (0/1). 6. Lymph node, biopsy, 4 R #2 - ONE LYMPH NODE, NEGATIVE FOR TUMOR (0/1). 7. Lymph node, biopsy, 11 R #3 - ONE LYMPH NODE, NEGATIVE FOR TUMOR (0/1). 8. Lung, resection (segmental or lobe), Right upper lobe - BENIGN LUNG, SEE COMMENT. - NEGATIVE FOR ATYPIA OR MALIGNANCY. - BRONCHIAL MARGIN, NEGATIVE FOR ATYPIA OR MALIGNANCY. 9. Lymph node, biopsy, 9 R - ONE LYMPH NODE, NEGATIVE FOR TUMOR (0/1). 10. Lymph node, biopsy, 8 Right - ONE LYMPH NODE, NEGATIVE FOR TUMOR (0/1). 11. Lymph node, biopsy, 7 Right - ONE LYMPH NODE,  NEGATIVE FOR TUMOR (0/1). 12. Lymph node, biopsy, 7 R #2 - ONE LYMPH NODE, NEGATIVE FOR TUMOR (0/1). 13. Lymph node, biopsy, 10 R #2 - ONE LYMPH NODE, NEGATIVE FOR TUMOR (0/1). 14. Lymph node, biopsy, 7 R #3 1 of 4 FINAL for Elizabeth [name], Elizabeth [name] [phone]) Diagnosis(continued) - ONE LYMPH NODE, NEGATIVE FOR TUMOR (0/1). 15. Lymph node, biopsy, 4 R #3 - ONE LYMPH NODE, NEGATIVE FOR TUMOR (0/1). TNM code: pT2, pN0, pMX    History of Presenting Illness: This is a 43 year old woman with a history of HIV and tobacco abuse (25 pack years). She complains of chest pain and shortness of breath. Her chest pain is in the right upper back under her shoulder blade and in the right anterior chest wall. She says this hurts all the time. It is not related to exertion. It is worsened with coughing or taking a deep breath. The pain started about 5 months ago. She also complains of feeling shortness of breath at all times, and difficulty catching her breath due to the pain. She has been taking oxycodone for the pain. She also complains of wheezing.  She was seen in the emergency room in October for shortness of breath. A CT of the chest at that time showed a right upper lobe mass. There also were several other areas in the lungs bilaterally suspicious for pneumonia.  She saw Dr. Melvyn Novas and underwent a bronchoscopic biopsy. It was nondiagnostic. A PET CT showed the lesion to be hypermetabolic. Of note, the other  infiltrates in the lung had cleared. She smokes about one pack of cigarettes a day and has since is 43 years old. She does not want to quit. She says her appetite has been poor but she gained about 30 pounds over the past 6 months. She attributes this to not being able to be active due to the pain.  She was diagnosed with HIV in 2006. She recently was seen by Dr. Linus Salmons for the first time.  She also complains of frequent seizures. She had 9 seizures in 1 day about a week ago and has been in the ED on multiple  occasions for seizure the past couple of months. She says she had followup arranged with a neurologist (she's not sure of the name), but has not followed up with them.  Elizabeth [name] returned after having pulmonary function testing. PFT results were FVC 2.12 (66%), 2.32 post bronchodilator FEV1 1.33 (48%), 1.46 (53%) post DLCO 63% predicted. She continues to have a right upper chest pain. She is taking oxycodone for that. She was seen in the emergency room again with seizures. She has an appointment to see a neurologist Dr. Lynnette Caffey at [facility] tomorrow. Dr. Roxan Hockey discussed the need for right VATS, RUL wedge resection, and thoracoscopic RUL. Potential risks, benefits, and complications of the surgery were discussed with the patient and she agreed to proceed with surgery. She was admitted to [facility] on 07/05/2013 in order to undergo the aforementioned procedure.   Brief Hospital Course:  A line and foley were removed early in her post operative course. Chest tubes did not have an air leak. She was initially put on a full dose Fentanyl PCA. This was changed to a reduced dose Dilaudid, as it was much too strong for her.Chest tubes were placed to water seal on post operative day one.  Chest tube output gradually decreased. Daily chest x rays were obtained. All chest tubes were removed by 07/12/2013. She did have a fever to 101.9. UA was negative and there were no signs of a wound infection. The etiology was pulmonary-atelectasis and worsening air space disease on the right. She was empirically started on Levaquin. Also, she was tachycardic and was placed on Lopressor.She has been tolerating a diet and has had a bowel movement. She is ambulating on room air. She was seen and evaluated today. She is felt surgically stable for discharge.    Latest Vital Signs: Blood pressure 92/65, pulse 95, temperature 98.7 F (37.1 C), temperature source Oral, resp. rate 18, height 5' 1"  (1.549 m), weight 74.7 kg (164  lb 10.9 oz), last menstrual period 06/28/2013, SpO2 97.00%.  Physical Exam: Cardiovascular: RRR  Pulmonary: Diminished at right base; left lung is clear;no rales, wheezes, or rhonchi.  Abdomen: Soft, non tender, bowel sounds present.  Extremities: No lower extremity edema.  Wounds: Clean and dry, no signs of infection  Chest Tube: to water seal   Discharge Condition:Stable  Recent laboratory studies:  Lab Results  Component Value Date   WBC 7.1 07/08/2013   HGB 9.5* 07/08/2013   HCT 28.8* 07/08/2013   MCV 90.9 07/08/2013   PLT 233 07/08/2013   Lab Results  Component Value Date   NA 140 07/08/2013   K 4.1 07/08/2013   CL 101 07/08/2013   CO2 30 07/08/2013   CREATININE 0.67 07/08/2013   GLUCOSE 122* 07/08/2013     Imaging Studies:  Dg Chest Port 1 View EXAM:  PORTABLE CHEST - 1 VIEW 07/12/2013 0929 hr  COMPARISON:  DG CHEST 1V PORT dated 07/12/2013 0559 hr ; DG CHEST 1V  PORT dated 07/11/2013; DG CHEST 1V PORT dated 07/10/2013; DG CHEST 1V  PORT dated 07/09/2013  FINDINGS:  Right chest tube removal with no change in the right apical  pneumothorax on the order of 10-15% or so, with the lung tethered to  the apical pleura. Slight interval worsening of atelectasis at the  base of the remaining right lung. Left lung remains clear. Cardiac  silhouette mildly enlarged but stable. Pulmonary vascularity normal.  Minimal subcutaneous emphysema in the right chest wall, unchanged.  IMPRESSION:  1. Stable right apical pneumothorax on the order 10-15% or so after  right chest tube removal. The lung is tethered to the apical pleura.  2. Slight interval increase in atelectasis at the right lung base  since earlier same date.  3. Left lung remains clear.  4. Stable mild cardiomegaly without pulmonary edema.  Electronically Signed  By: Evangeline Dakin M.D.  On: 07/12/2013 09:50         Future Appointments Provider Department Dept Phone   07/27/2013 9:30 AM Melrose Nakayama, MD [facility] [phone]      Discharge Medications:   Medication List    STOP taking these medications       acetaminophen 500 MG tablet  Commonly known as:  TYLENOL     HYDROmorphone 4 MG tablet  Commonly known as:  DILAUDID      TAKE these medications       albuterol 108 (90 BASE) MCG/ACT inhaler  Commonly known as:  PROVENTIL HFA;VENTOLIN HFA  Inhale 2 puffs into the lungs every 4 (four) hours as needed for wheezing.     divalproex 250 MG DR tablet  Commonly known as:  DEPAKOTE  Take 3 tablets (750 mg total) by mouth 2 (two) times daily. Take 1 tab in the morning and 2 tabs at night     elvitegravir-cobicistat-emtricitabine-tenofovir 150-150-200-300 MG Tabs tablet  Commonly known as:  STRIBILD  Take 1 tablet by mouth daily with breakfast.     oxyCODONE-acetaminophen 5-325 MG per tablet  Commonly known as:  PERCOCET/ROXICET  Take 1-2 tablets by mouth every 4 (four) hours as needed for severe pain.     QUEtiapine 200 MG tablet  Commonly known as:  SEROQUEL  Take 200 mg by mouth at bedtime.     TRAZODONE HCL PO  Take 200 mg by mouth daily.        Follow Up Appointments: Follow-up Information   Follow up with Melrose Nakayama, MD On 07/27/2013. (PA/LAT CXR to be taken (at [facility] which is in the same building as Dr. Leonarda Salon office) on 07/27/2013 at 8:30 am;Appointment with Dr. Roxan Hockey is at 9:30 am)    Specialty:  Cardiothoracic Surgery   Contact information:   [address] [place] [address] [phone]       Signed: Lars Pinks MPA-C 07/13/2013, 8:42 AM

## 2013-07-09 NOTE — Discharge Instructions (Signed)
Video-Assisted Thoracic Surgery °Care After °Refer to this sheet in the next few weeks. These instructions provide you with information on caring for yourself after your procedure. Your caregiver may also give you more specific instructions. Your procedure has been planned according to current medical practices, but problems sometimes occur. Call your caregiver if you have any problems or questions after your procedure. °HOME CARE INSTRUCTIONS  °· Only take over-the-counter or prescription medications as directed. °· Only take pain medications (narcotics) as directed. °· Do not drive until your caregiver approves. Driving while taking narcotics or soon after surgery can be dangerous, so discuss the specific timing with your caregiver. °· Avoid activities that use your chest muscles, such as lifting heavy objects, for at least 3 4 weeks.   °· Take deep breaths to expand the lungs and to protect against pneumonia. °· Do breathing exercises as directed by your caregiver. If you were given an incentive spirometer to help with breathing, use it as directed. °· You may resume a normal diet and activities when you feel you are able to or as directed. °· Do not take a bath until your caregiver says it is OK. Use the shower instead.   °· Keep the bandage (dressing) covering the area where the chest tube was inserted (incision site) dry for 48 hours. After 48 hours, remove the dressing unless there is new drainage. °· Remove dressings as directed by your caregiver. °· Change dressings if necessary or as directed. °· Keep all follow-up appointments. It is important for you to see your caregiver after surgery to discuss appropriate follow-up care and surveillance, if it is necessary. °SEEK MEDICAL CARE: °· You feel excessive or increasing pain at an incision site. °· You notice bleeding, skin irritation, drainage, swelling, or redness at an incision site. °· There is a bad smell coming from an incision or dressing. °· It feels  like your heart is fluttering or beating rapidly. °· Your pain medication does not relieve your pain. °SEEK IMMEDIATE MEDICAL CARE IF:  °· You have a fever.   °· You have chest pain.  °· You have a rash. °· You have shortness of breath. °· You have trouble breathing.   °· You feel weak, lightheaded, dizzy, or faint.   °MAKE SURE YOU:  °· Understand these instructions.   °· Will watch your condition.   °· Will get help right away if you are not doing well or get worse. °Document Released: 09/14/2012 Document Reviewed: 09/14/2012 °ExitCare® Patient Information ©2014 [employer]. ° °

## 2013-07-09 NOTE — Progress Notes (Addendum)
      [address]       [place] [address]             [phone]       4 Days Post-Op Procedure(s) (LRB): VIDEO ASSISTED THORACOSCOPY (VATS)/WEDGE RESECTION, Right Upper Lobectomy with lymph node disecction and OnQ placement (Right)  Subjective: Patient with incisional pain  Objective: Vital signs in last 24 hours: Temp:  [98.9 F (37.2 C)-101.9 F (38.8 C)] 99.3 F (37.4 C) (02/06 0400) Pulse Rate:  [106-130] 114 (02/06 0710) Cardiac Rhythm:  [-] Sinus tachycardia (02/06 0710) Resp:  [13-23] 13 (02/06 0713) BP: (101-129)/(60-82) 128/82 mmHg (02/06 0710) SpO2:  [90 %-97 %] 97 % (02/06 0713)     Intake/Output from previous day: 02/05 0701 - 02/06 0700 In: 530 [P.O.:200; I.V.:330] Out: 1275 [Urine:1000; Chest Tube:275]   Physical Exam:  Cardiovascular: RRR Pulmonary: Diminished at right base; left lung is clear;no rales, wheezes, or rhonchi. Abdomen: Soft, non tender, bowel sounds present. Extremities: No lower extremity edema. Wounds: Clean and dry, no signs of infection Chest Tube: to water seal  Lab Results: CBC:  Recent Labs  07/07/13 0225 07/08/13 0415  WBC 5.9 7.1  HGB 9.8* 9.5*  HCT 28.9* 28.8*  PLT 213 233   BMET:   Recent Labs  07/07/13 0225 07/08/13 0415  NA 141 140  K 4.5 4.1  CL 107 101  CO2 26 30  GLUCOSE 98 122*  BUN 7 5*  CREATININE 0.65 0.67  CALCIUM 8.0* 8.4    PT/INR: No results found for this basename: LABPROT, INR,  in the last 72 hours ABG:  INR: Will add last result for INR, ABG once components are confirmed Will add last 4 CBG results once components are confirmed  Assessment/Plan:  1. CV - SR/ST with HR in the 110's. Increased HR may secondary to pain and/or infection (had fever to 101.5) 2.  Pulmonary - Chest tube with 275 cc last 24 hours. CT to water seal and no air leak.CXR appears stable  (questionable trace right apical pneumothorax, bibasilar atelectasis.). Possibly remove remaining chest  tube.Encourage incentive spirometer. 3. Mild ABL anemia- H and H 9.5 and 28.8 4. T max 101.9 last evening and now 99.3.No signs of wound infection. UA negative and central line removed. Etiology likely secondary to atelectasis. 5. LOC constipation 6. Stop PCA after chest tube removed  ZIMMERMAN,DONIELLE MPA-C 07/09/2013,7:36 AM  She was febrile overnight and CXR shows worsening AS disease in right lung- will start empiric levaquin Still tachycardic- start lopressor Will keep CT in another day

## 2013-07-10 ENCOUNTER — Inpatient Hospital Stay (HOSPITAL_COMMUNITY): Payer: Medicaid Other

## 2013-07-10 NOTE — Progress Notes (Addendum)
[address]       [place] [address]             [phone]          5 Days Post-Op Procedure(s) (LRB): VIDEO ASSISTED THORACOSCOPY (VATS)/WEDGE RESECTION, Right Upper Lobectomy with lymph node disecction and OnQ placement (Right)  Subjective: Productive cough this am, otherwise stable.   Objective: Vital signs in last 24 hours: Patient Vitals for the past 24 hrs:  BP Temp Temp src Pulse Resp SpO2  07/10/13 0800 - - - - 20 93 %  07/10/13 0700 - 99.4 F (37.4 C) Oral - - -  07/10/13 0422 - - - - 17 91 %  07/10/13 0411 108/63 mmHg 99.6 F (37.6 C) Oral 97 19 96 %  07/10/13 0247 - - - 93 12 96 %  07/09/13 2313 106/75 mmHg 98.9 F (37.2 C) Oral 93 23 95 %  07/09/13 2240 - - - 97 18 95 %  07/09/13 2114 124/76 mmHg - - 102 - -  07/09/13 2028 95/61 mmHg - - 93 16 98 %  07/09/13 2027 - - - - - 96 %  07/09/13 1944 - - - - 12 96 %  07/09/13 1932 95/61 mmHg 98.8 F (37.1 C) Oral 94 - 97 %  07/09/13 1756 - 98.7 F (37.1 C) Oral - - -  07/09/13 1426 - - - - - 97 %  07/09/13 1200 - - - - 24 97 %  07/09/13 1156 - 98.5 F (36.9 C) Oral - - -  07/09/13 1141 107/66 mmHg - - 108 16 96 %  07/09/13 0918 - - - - - 91 %   Current Weight  07/05/13 164 lb 10.9 oz (74.7 kg)     Intake/Output from previous day: 02/06 0701 - 02/07 0700 In: 1680 [P.O.:1560; I.V.:120] Out: 1254 [Urine:803; Stool:1; Chest Tube:450]    PHYSICAL EXAM:  Heart: RRR, mildly tachy 100-110s Lungs: Few coarse BS bilaterally Wound: Clean and dry Chest tube: No air leak    Lab Results: CBC: Recent Labs  07/08/13 0415  WBC 7.1  HGB 9.5*  HCT 28.8*  PLT 233   BMET:  Recent Labs  07/08/13 0415  NA 140  K 4.1  CL 101  CO2 30  GLUCOSE 122*  BUN 5*  CREATININE 0.67  CALCIUM 8.4    PT/INR: No results found for this basename: LABPROT, INR,  in the last 72 hours  CXR: FINDINGS:  Cardiac shadow is stable. Volume loss is noted on the right. Chest  tube is again  noted and stable. No definitive right-sided  pneumothorax is noted. Patchy infiltrative changes are noted in the  mid and lower lung field on the right. Improved aeration is noted on  the left.  IMPRESSION:  Stable changes on the right. No definitive pneumothorax is seen.  Improved aeration in the left lung.   Assessment/Plan: S/P Procedure(s) (LRB): VIDEO ASSISTED THORACOSCOPY (VATS)/WEDGE RESECTION, Right Upper Lobectomy with lymph node disecction and OnQ placement (Right) CT output remains around 200 ml/shift.  CXR stable, no air leak. Continue CT for now. Levaquin D#2 for presumed bronchitis.  CXR shows improved aeration.  Continue pulm toilet. No further fevers. GI- +BMs yesterday. Ambulate in halls.    LOS: 5 days    COLLINS,GINA H 07/10/2013  I have seen and examined the patient and agree with the assessment and plan as outlined.  Leave tube in place until output decreased.  OWEN,CLARENCE H 07/10/2013 11:39 AM

## 2013-07-11 ENCOUNTER — Inpatient Hospital Stay (HOSPITAL_COMMUNITY): Payer: Medicaid Other

## 2013-07-11 MED ORDER — METOPROLOL TARTRATE 12.5 MG HALF TABLET
12.5000 mg | ORAL_TABLET | Freq: Two times a day (BID) | ORAL | Status: DC
  Administered 2013-07-12: 12.5 mg via ORAL
  Filled 2013-07-11 (×5): qty 1

## 2013-07-11 NOTE — Progress Notes (Addendum)
[address]       [place] [address]             [phone]          6 Days Post-Op Procedure(s) (LRB): VIDEO ASSISTED THORACOSCOPY (VATS)/WEDGE RESECTION, Right Upper Lobectomy with lymph node disecction and OnQ placement (Right)  Subjective: Stable, no new complaints.  Decreased cough. Walking in [place] without difficulty.   Objective: Vital signs in last 24 hours: Patient Vitals for the past 24 hrs:  BP Temp Temp src Pulse Resp SpO2  07/11/13 0840 95/62 mmHg - - 98 14 96 %  07/11/13 0812 85/72 mmHg - - 102 18 90 %  07/11/13 0800 - - - - 21 96 %  07/11/13 0730 - 100.5 F (38.1 C) Oral - - 96 %  07/11/13 0336 - - - - 18 98 %  07/11/13 0329 81/47 mmHg 98 F (36.7 C) Oral 93 16 95 %  07/11/13 0108 - - - - - 94 %  07/10/13 2332 85/56 mmHg 98.1 F (36.7 C) Oral 91 17 98 %  07/10/13 2038 - - - - 18 98 %  07/10/13 2007 107/69 mmHg 98.4 F (36.9 C) Oral 97 16 100 %  07/10/13 2005 - - - - - 95 %  07/10/13 1650 - - - - 16 98 %  07/10/13 1538 - 98.8 F (37.1 C) Oral - - -  07/10/13 1535 92/60 mmHg - - 89 16 98 %  07/10/13 1432 - - - - - 97 %  07/10/13 1312 - - - - 18 98 %  07/10/13 1222 95/63 mmHg 98.5 F (36.9 C) Oral 97 13 98 %  07/10/13 1200 - - - - 15 98 %  07/10/13 1135 - - - - 20 96 %  07/10/13 1009 - - - - - 98 %   Current Weight  07/05/13 164 lb 10.9 oz (74.7 kg)     Intake/Output from previous day: 02/07 0701 - 02/08 0700 In: 1889.3 [P.O.:1452; I.V.:437.3] Out: 1935 [Urine:1600; Chest Tube:335]    PHYSICAL EXAM:  Heart: RRR Lungs: Still with few rhonchi, much clearer today Wound: Clean and dry Chest tube: No air leak    Lab Results: CBC:No results found for this basename: WBC, HGB, HCT, PLT,  in the last 72 hours BMET: No results found for this basename: NA, K, CL, CO2, GLUCOSE, BUN, CREATININE, CALCIUM,  in the last 72 hours  PT/INR: No results found for this basename: LABPROT, INR,  in the last 72 hours  CXR:  FINDINGS:  Postsurgical changes are again noted on the right. A a right-sided  chest tube is seen. A small apical pneumothorax is now seen. This  was not as well appreciated on the prior exam. The left lung is  stable. Cardiac shadow is within normal limits.  IMPRESSION:  Postoperative changes on the right. A small apical pneumothorax is  seen.   Assessment/Plan: S/P Procedure(s) (LRB): VIDEO ASSISTED THORACOSCOPY (VATS)/WEDGE RESECTION, Right Upper Lobectomy with lymph node disecction and OnQ placement (Right) CT output 50/285 ml over past 2 shifts.  Continue CT for now. Hopefully can d/c soon. Levaquin D#3 for presumed bronchitis. LGF overnight and still on 3-4 L O2.  Continue aggressive pulm toilet. Low BPs overnight- will decrease Lopressor.    LOS: 6 days    COLLINS,GINA H 07/11/2013  I have seen and examined the patient and agree with the assessment and plan as outlined.  Chest  tube still draining although output decreasing.  Will leave 1 more day.  Tinaya Ceballos H 07/11/2013 11:28 AM

## 2013-07-12 ENCOUNTER — Inpatient Hospital Stay (HOSPITAL_COMMUNITY): Payer: Medicaid Other

## 2013-07-12 NOTE — Progress Notes (Signed)
Utilization review completed.  

## 2013-07-12 NOTE — Progress Notes (Signed)
7 Days Post-Op Procedure(s) (LRB): VIDEO ASSISTED THORACOSCOPY (VATS)/WEDGE RESECTION, Right Upper Lobectomy with lymph node disecction and OnQ placement (Right) Subjective: She is emotional today- her son is leaving for [place] with the [employer] No c/o SOB, still some incisional pain  Objective: Vital signs in last 24 hours: Temp:  [98.1 F (36.7 C)-99.2 F (37.3 C)] 98.7 F (37.1 C) (02/09 0410) Pulse Rate:  [88-102] 96 (02/09 0410) Cardiac Rhythm:  [-] Normal sinus rhythm (02/09 0000) Resp:  [14-22] 21 (02/09 0410) BP: (85-109)/(56-73) 108/68 mmHg (02/09 0410) SpO2:  [90 %-98 %] 96 % (02/09 0410)  Hemodynamic parameters for last 24 hours:    Intake/Output from previous day: 02/08 0701 - 02/09 0700 In: 510 [P.O.:360; I.V.:150] Out: 225 [Urine:175; Chest Tube:50] Intake/Output this shift: Total I/O In: -  Out: 200 [Chest Tube:200]  General appearance: alert and no distress Neurologic: intact Heart: regular rate and rhythm Lungs: rhonchi faint bilateral Wound: clean and dry no air leak, serous drainage  Lab Results: No results found for this basename: WBC, HGB, HCT, PLT,  in the last 72 hours BMET: No results found for this basename: NA, K, CL, CO2, GLUCOSE, BUN, CREATININE, CALCIUM,  in the last 72 hours  PT/INR: No results found for this basename: LABPROT, INR,  in the last 72 hours ABG    Component Value Date/Time   PHART 7.407 07/06/2013 0425   HCO3 27.9* 07/06/2013 0425   TCO2 29.3 07/06/2013 0425   ACIDBASEDEF 1.6 06/08/2013 1454   O2SAT 94.2 07/06/2013 0425   CBG (last 3)  No results found for this basename: GLUCAP,  in the last 72 hours  Assessment/Plan: S/P Procedure(s) (LRB): VIDEO ASSISTED THORACOSCOPY (VATS)/WEDGE RESECTION, Right Upper Lobectomy with lymph node disecction and OnQ placement (Right) - Her CT output continues to be sporadic- 50 ml all day yesterday then 200 overnight- will dc CT  DC PCA after CT out  Wean O2  Continue ambulation  Possibly  home in AM   LOS: 7 days    Kendarrius Tanzi C 07/12/2013

## 2013-07-13 ENCOUNTER — Telehealth: Payer: Self-pay | Admitting: *Deleted

## 2013-07-13 ENCOUNTER — Inpatient Hospital Stay (HOSPITAL_COMMUNITY): Payer: Medicaid Other

## 2013-07-13 DIAGNOSIS — G8918 Other acute postprocedural pain: Secondary | ICD-10-CM

## 2013-07-13 MED ORDER — METOPROLOL TARTRATE 25 MG PO TABS: 12.5000 mg | ORAL_TABLET | Freq: Two times a day (BID) | ORAL | Status: DC

## 2013-07-13 MED ORDER — HYDROMORPHONE HCL 2 MG PO TABS: 2.0000 mg | ORAL_TABLET | Freq: Four times a day (QID) | ORAL | Status: DC | PRN

## 2013-07-13 MED ORDER — OXYCODONE-ACETAMINOPHEN 5-325 MG PO TABS: 1.0000 | ORAL_TABLET | ORAL | Status: DC | PRN

## 2013-07-13 NOTE — Progress Notes (Signed)
Pt d/c home today. Reviewed follow up visit, meds, etc. Pt & family understood.

## 2013-07-13 NOTE — Telephone Encounter (Signed)
Pt's son said she was discharged today and the pain med prescribed was not relieving her pain.  She wants the Dilaudid that she was taking before the surgery and while in the hospital.  I discussed this with Dr. Roxan Hockey in the office and he approved the use of Dilaudid.  Her son will come to the office to pick up the prescription.

## 2013-07-13 NOTE — Progress Notes (Addendum)
      [address]       [place] [address]             [phone]       8 Days Post-Op Procedure(s) (LRB): VIDEO ASSISTED THORACOSCOPY (VATS)/WEDGE RESECTION, Right Upper Lobectomy with lymph node disecction and OnQ placement (Right)  Subjective: Patient with incisional pain  Objective: Vital signs in last 24 hours: Temp:  [97.9 F (36.6 C)-98.6 F (37 C)] 98 F (36.7 C) (02/10 0421) Pulse Rate:  [79-103] 79 (02/10 0421) Cardiac Rhythm:  [-] Normal sinus rhythm (02/09 2250) Resp:  [9-24] 21 (02/10 0600) BP: (86-109)/(57-76) 107/61 mmHg (02/10 0600) SpO2:  [90 %-100 %] 93 % (02/10 0421)     Intake/Output from previous day: 02/09 0701 - 02/10 0700 In: 932 [P.O.:800; I.V.:132] Out: 610 [Urine:400; Chest Tube:210]   Physical Exam:  Cardiovascular: RRR Pulmonary: Mostly clear Abdomen: Soft, non tender, bowel sounds present. Extremities: No lower extremity edema. Wounds: Clean and dry, no signs of infection   Lab Results: CBC: No results found for this basename: WBC, HGB, HCT, PLT,  in the last 72 hours BMET:  No results found for this basename: NA, K, CL, CO2, GLUCOSE, BUN, CREATININE, CALCIUM,  in the last 72 hours  PT/INR: No results found for this basename: LABPROT, INR,  in the last 72 hours ABG:  INR: Will add last result for INR, ABG once components are confirmed Will add last 4 CBG results once components are confirmed  Assessment/Plan:  1. CV - SR. On Lopressor 12.5 bid 2.  Pulmonary -CXR shows small, stable right apical pneumothorax, minor subcutaneous emphysemas on the right, and left lung is clear.Encourage incentive spirometer. 3. Mild ABL anemia- H and H 9.5 and 28.8 4.  ID-on Levaquin (day 5) for presumed bronchitis. Remains afebrile.Will likely stop after today. 5.Likely discharge today.  ZIMMERMAN,DONIELLE MPA-C 07/13/2013,7:50 AM  Patient seen and examined, agree with above Dc home today Tachycardia resolved and BP a  little low- will dc lopressor Incision looks good

## 2013-07-22 ENCOUNTER — Encounter (HOSPITAL_COMMUNITY): Payer: Self-pay

## 2013-07-22 ENCOUNTER — Other Ambulatory Visit: Payer: Self-pay | Admitting: *Deleted

## 2013-07-22 ENCOUNTER — Other Ambulatory Visit: Payer: Self-pay

## 2013-07-22 DIAGNOSIS — R918 Other nonspecific abnormal finding of lung field: Secondary | ICD-10-CM

## 2013-07-22 DIAGNOSIS — G8918 Other acute postprocedural pain: Secondary | ICD-10-CM

## 2013-07-22 MED ORDER — OXYCODONE-ACETAMINOPHEN 5-325 MG PO TABS: 1.0000 | ORAL_TABLET | Freq: Three times a day (TID) | ORAL | Status: DC | PRN

## 2013-07-22 NOTE — Telephone Encounter (Signed)
RX for Percocet 5/325 mg printed out and PA signed. Pt states that she does not need Dilaudid any more, it's to strong. She would prefer Percocet.

## 2013-07-26 ENCOUNTER — Encounter (HOSPITAL_COMMUNITY): Payer: Self-pay

## 2013-07-27 ENCOUNTER — Ambulatory Visit: Payer: No Typology Code available for payment source | Admitting: Thoracic Surgery (Cardiothoracic Vascular Surgery)

## 2013-07-28 ENCOUNTER — Telehealth: Payer: Self-pay | Admitting: *Deleted

## 2013-07-28 NOTE — Telephone Encounter (Signed)
error 

## 2013-07-29 ENCOUNTER — Ambulatory Visit: Payer: No Typology Code available for payment source | Admitting: Thoracic Surgery (Cardiothoracic Vascular Surgery)

## 2013-07-30 ENCOUNTER — Ambulatory Visit: Payer: Self-pay | Admitting: Thoracic Surgery (Cardiothoracic Vascular Surgery)

## 2013-07-30 ENCOUNTER — Other Ambulatory Visit: Payer: Self-pay

## 2013-07-30 DIAGNOSIS — G8918 Other acute postprocedural pain: Secondary | ICD-10-CM

## 2013-07-30 MED ORDER — OXYCODONE-ACETAMINOPHEN 5-325 MG PO TABS: 1.0000 | ORAL_TABLET | Freq: Three times a day (TID) | ORAL | Status: DC | PRN

## 2013-07-30 NOTE — Telephone Encounter (Signed)
RX refill for Percocet 5/325 mg printed out and Dr Servando Snare signed. Pt will pick up later today.

## 2013-08-03 ENCOUNTER — Encounter: Payer: Self-pay | Admitting: Thoracic Surgery (Cardiothoracic Vascular Surgery)

## 2013-08-03 ENCOUNTER — Ambulatory Visit
Admission: RE | Admit: 2013-08-03 | Discharge: 2013-08-03 | Disposition: A | Discharge status: Home / Self Care | Payer: Self-pay | Source: Ambulatory Visit | Attending: Thoracic Surgery (Cardiothoracic Vascular Surgery) | Admitting: Thoracic Surgery (Cardiothoracic Vascular Surgery)

## 2013-08-03 ENCOUNTER — Ambulatory Visit (INDEPENDENT_AMBULATORY_CARE_PROVIDER_SITE_OTHER): Payer: Self-pay | Admitting: Thoracic Surgery (Cardiothoracic Vascular Surgery)

## 2013-08-03 VITALS — BP 110/70 | HR 66 | Resp 16 | Ht 61.0 in | Wt 142.0 lb

## 2013-08-03 DIAGNOSIS — R918 Other nonspecific abnormal finding of lung field: Secondary | ICD-10-CM

## 2013-08-03 DIAGNOSIS — C3410 Malignant neoplasm of upper lobe, unspecified bronchus or lung: Secondary | ICD-10-CM

## 2013-08-03 DIAGNOSIS — Z9889 Other specified postprocedural states: Secondary | ICD-10-CM

## 2013-08-03 DIAGNOSIS — Z902 Acquired absence of lung [part of]: Secondary | ICD-10-CM

## 2013-08-03 NOTE — Progress Notes (Signed)
HPI:  Ms. Arbuthnot turns today for a scheduled postoperative followup visit.  She is a 43 year old woman with history of polysubstance abuse including tobacco abuse. 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 She underwent a right upper lobectomy. Dissection was extrapleural in the vicinity of the tumor. There was no definitive chest wall invasion.  Postoperatively her course was remarkable for bronchitis which was treated with antibiotics and resolved. She had a intermittent airleak which resulted in the tube being in for longer than normal. However she did not have any major complications and was discharged on postoperative day #8.  After discharge she was not getting pain relief of oxycodone so we switched her to Dilaudid. That helped significantly. She currently is taking oxycodone 1-2 tablets every 8 hours as needed for pain.  She says that she is feeling better. She still has incisional pain but it's easier to control than it was. She's not having any respiratory difficulties. She says that her appetite is poor and she has lost weight.  Past Medical History  Diagnosis Date  . HIV (human immunodeficiency virus infection)     takes Stribild daily  . Substance abuse   . Asthma     uses Albuterol inhaler daily as needed  . Shortness of breath     sitting/lying/exertion  . Headache(784.0)   . History of migraine     last one about 2wks ago  . Seizures     takes Depakote daily-last one over a week ago  . Dizziness   . Dysphagia   . Urinary frequency   . Nocturia   . Depression     takes Trazodone and Seroquel nightly     Current Outpatient Prescriptions  Medication Sig Dispense Refill  . albuterol (PROVENTIL HFA;VENTOLIN HFA) 108 (90 BASE) MCG/ACT inhaler Inhale 2 puffs into the lungs every 4 (four) hours as needed for wheezing.  1 Inhaler  0  . divalproex (DEPAKOTE) 250 MG DR tablet Take 3 tablets (750 mg total) by mouth 2  (two) times daily. Take 1 tab in the morning and 2 tabs at night  90 tablet  0  . elvitegravir-cobicistat-emtricitabine-tenofovir (STRIBILD) 150-150-200-300 MG TABS tablet Take 1 tablet by mouth daily with breakfast.  30 tablet  6  . oxyCODONE-acetaminophen (PERCOCET/ROXICET) 5-325 MG per tablet Take 1-2 tablets by mouth every 8 (eight) hours as needed for severe pain.  40 tablet  0  . QUEtiapine (SEROQUEL) 200 MG tablet Take 200 mg by mouth at bedtime.      . TRAZODONE HCL PO Take 200 mg by mouth daily.       No current facility-administered medications for this visit.    Physical Exam BP 110/70  Pulse 66  Resp 16  Ht 5' 1"  (1.549 m)  Wt 142 lb (64.411 kg)  BMI 26.84 kg/m2  SpO90 47% 43 year old woman in no acute distress Well-appearing and well-nourished Incisions well healed Cardiac regular rate and rhythm Lungs clear with equal breath sounds bilaterally  Diagnostic Tests: Chest x-ray 08/03/2013 CHEST 2 VIEW  COMPARISON: 07/13/2013  FINDINGS:  Right apical pneumothorax has resolved. Postop changes on the right  with scarring and pleural thickening in the right apex. No  significant layering effusion.  COPD with hyperinflation of the lungs. Negative for heart failure or  pneumonia.  IMPRESSION:  Postop changes on the right. Negative for pneumothorax.  Electronically Signed  By: Franchot Gallo M.D.  On: 08/03/2013 14:56  Impression: Mrs. Niccoli is a 43 year old woman who is about one month post thoracoscopic right upper lobectomy for a T2 N0 non-small cell carcinoma. She is stage IB with a small tumor. Her case was discussed at our thoracic oncology conference and the consensus was that adjuvant chemotherapy is not indicated.  She still having some pain which is to be expected. I did reinforce to her that we need to try to get her off of narcotic pain medication as soon as possible. However I think it's a little early to take her off of it altogether just yet. I did advise her  to try to cut back on that as possible.  Her activities are unrestricted, but she should not try to drive until she is off narcotics.  Plan: I'll plan to see her back in 3 months with a PA and lateral chest x-ray to check on her progress.

## 2013-08-06 ENCOUNTER — Other Ambulatory Visit: Payer: Self-pay

## 2013-08-06 DIAGNOSIS — G8918 Other acute postprocedural pain: Secondary | ICD-10-CM

## 2013-08-06 MED ORDER — OXYCODONE-ACETAMINOPHEN 5-325 MG PO TABS: 1.0000 | ORAL_TABLET | Freq: Three times a day (TID) | ORAL | Status: DC | PRN

## 2013-08-06 NOTE — Telephone Encounter (Signed)
RX refill for percocet 5/325 mg printed out and patient advised that it will need to last up to 2 weeks. She will pick up at front desk

## 2013-08-18 ENCOUNTER — Other Ambulatory Visit: Payer: Self-pay

## 2013-08-20 ENCOUNTER — Other Ambulatory Visit: Payer: Self-pay | Admitting: *Deleted

## 2013-08-20 DIAGNOSIS — G8918 Other acute postprocedural pain: Secondary | ICD-10-CM

## 2013-08-20 MED ORDER — OXYCODONE-ACETAMINOPHEN 5-325 MG PO TABS: 1.0000 | ORAL_TABLET | Freq: Three times a day (TID) | ORAL | Status: DC | PRN

## 2013-08-29 ENCOUNTER — Emergency Department (HOSPITAL_COMMUNITY)
Admission: EM | Admit: 2013-08-29 | Discharge: 2013-08-29 | Disposition: A | Discharge status: Home / Self Care | Payer: Medicaid Other | Attending: Emergency Medicine | Admitting: Emergency Medicine

## 2013-08-29 ENCOUNTER — Encounter (HOSPITAL_COMMUNITY): Payer: Self-pay | Admitting: Emergency Medicine

## 2013-08-29 DIAGNOSIS — F329 Major depressive disorder, single episode, unspecified: Secondary | ICD-10-CM | POA: Insufficient documentation

## 2013-08-29 DIAGNOSIS — F17200 Nicotine dependence, unspecified, uncomplicated: Secondary | ICD-10-CM | POA: Insufficient documentation

## 2013-08-29 DIAGNOSIS — Z21 Asymptomatic human immunodeficiency virus [HIV] infection status: Secondary | ICD-10-CM | POA: Diagnosis not present

## 2013-08-29 DIAGNOSIS — Z79899 Other long term (current) drug therapy: Secondary | ICD-10-CM | POA: Diagnosis not present

## 2013-08-29 DIAGNOSIS — G40909 Epilepsy, unspecified, not intractable, without status epilepticus: Secondary | ICD-10-CM | POA: Insufficient documentation

## 2013-08-29 DIAGNOSIS — G43909 Migraine, unspecified, not intractable, without status migrainosus: Secondary | ICD-10-CM | POA: Insufficient documentation

## 2013-08-29 DIAGNOSIS — J45909 Unspecified asthma, uncomplicated: Secondary | ICD-10-CM | POA: Insufficient documentation

## 2013-08-29 DIAGNOSIS — R569 Unspecified convulsions: Secondary | ICD-10-CM | POA: Diagnosis present

## 2013-08-29 DIAGNOSIS — F3289 Other specified depressive episodes: Secondary | ICD-10-CM | POA: Diagnosis not present

## 2013-08-29 MED ORDER — OXYCODONE-ACETAMINOPHEN 5-325 MG PO TABS
2.0000 | ORAL_TABLET | Freq: Once | ORAL | Status: AC
  Administered 2013-08-29: 2 via ORAL
  Filled 2013-08-29: qty 2

## 2013-08-29 MED ORDER — KETOROLAC TROMETHAMINE 60 MG/2ML IM SOLN
60.0000 mg | Freq: Once | INTRAMUSCULAR | Status: AC
  Administered 2013-08-29: 60 mg via INTRAMUSCULAR
  Filled 2013-08-29: qty 2

## 2013-08-29 NOTE — ED Notes (Addendum)
Pt to department via EMS.  Family reported some "seizure-like" activity following pt getting agitated earlier tonight.  Per EMS, pt has been alert, cooperative and stable during their interaction with her.  Pt ambulatory from EMS stretcher to bed. Pt reporting pain in right side and headache.

## 2013-08-29 NOTE — ED Notes (Signed)
Pt ambulated to bathroom with no difficulties.

## 2013-08-29 NOTE — ED Provider Notes (Signed)
CSN: [id number]     Arrival date & time 08/29/13  0038 History   First MD Initiated Contact with Patient 08/29/13 0057     Chief Complaint  Patient presents with  . Seizures     HPI Family reports he like activity after the patient found out that her daughter became injured tonight.  The patient states that she has a history of seizures when she becomes stressed or strained.  Family reports several of these episodes tonight.  She's also on Depakote for seizures.  She states she's been compliant with her medications.  No other complaints.  She did have a recent vats procedure on her right long there were 2015.  She states that after the seizure she develops a new pain on the right side of her chest.  No tongue biting or urinary incontinence.  Past Medical History  Diagnosis Date  . HIV (human immunodeficiency virus infection)     takes Stribild daily  . Substance abuse   . Asthma     uses Albuterol inhaler daily as needed  . Shortness of breath     sitting/lying/exertion  . Headache(784.0)   . History of migraine     last one about 2wks ago  . Seizures     takes Depakote daily-last one over a week ago  . Dizziness   . Dysphagia   . Urinary frequency   . Nocturia   . Depression     takes Trazodone and Seroquel nightly   Past Surgical History  Procedure Laterality Date  . Cesarean section  80yr ago  . Video bronchoscopy Bilateral 05/05/2013    Procedure: VIDEO BRONCHOSCOPY WITH FLUORO;  Surgeon: MTanda Rockers MD;  Location: [facility];  Service: Cardiopulmonary;  Laterality: Bilateral;  . Video assisted thoracoscopy (vats)/wedge resection Right 07/05/2013    Procedure: VIDEO ASSISTED THORACOSCOPY (VATS)/WEDGE RESECTION, Right Upper Lobectomy with lymph node disecction and OnQ placement;  Surgeon: SMelrose Nakayama MD;  Location: [facility] OR;  Service: Thoracic;  Laterality: Right;  (R)VATS, WEDGE RESECTION, POSSIBLE LOBECTOMY, POSSIBLE CHEST WALL RESECTION   Family History   Problem Relation Age of Onset  . Diabetes Mother   . CAD Mother   . Hypertension Mother   . Hypertension Sister   . Lung cancer Maternal Aunt     smoked  . Allergies Sister   . Asthma Sister   . Lung cancer Maternal Uncle     never smoker  . Lung cancer Maternal Grandmother     never smoker   History  Substance Use Topics  . Smoking status: Current Every Day Smoker -- 0.50 packs/day for 24 years    Types: Cigarettes  . Smokeless tobacco: Never Used  . Alcohol Use: No   OB History   Grav Para Term Preterm Abortions TAB SAB Ect Mult Living                 Review of Systems  All other systems reviewed and are negative.      Allergies  Gabapentin  Home Medications   Current Outpatient Rx  Name  Route  Sig  Dispense  Refill  . albuterol (PROVENTIL HFA;VENTOLIN HFA) 108 (90 BASE) MCG/ACT inhaler   Inhalation   Inhale 2 puffs into the lungs every 4 (four) hours as needed for wheezing.   1 Inhaler   0   . divalproex (DEPAKOTE) 250 MG DR tablet   Oral   Take 3 tablets (750 mg total) by mouth 2 (two) times daily.  Take 1 tab in the morning and 2 tabs at night   90 tablet   0   . elvitegravir-cobicistat-emtricitabine-tenofovir (STRIBILD) 150-150-200-300 MG TABS tablet   Oral   Take 1 tablet by mouth daily with breakfast.   30 tablet   6   . oxyCODONE-acetaminophen (PERCOCET/ROXICET) 5-325 MG per tablet   Oral   Take 1-2 tablets by mouth every 8 (eight) hours as needed for severe pain.   50 tablet   0   . QUEtiapine (SEROQUEL) 200 MG tablet   Oral   Take 200 mg by mouth at bedtime.         . TRAZODONE HCL PO   Oral   Take 200 mg by mouth daily.          BP 108/84  Pulse 96  Temp(Src) 98.3 F (36.8 C) (Oral)  Resp 16  Ht 5' 1"  (1.549 m)  Wt 142 lb (64.411 kg)  BMI 26.84 kg/m2  SpO2 96%  LMP 08/15/2013 Physical Exam  Nursing note and vitals reviewed. Constitutional: She is oriented to person, place, and time. She appears well-developed and  well-nourished. No distress.  HENT:  Head: Normocephalic and atraumatic.  Eyes: EOM are normal.  Neck: Normal range of motion.  Cardiovascular: Normal rate, regular rhythm and normal heart sounds.   Pulmonary/Chest: Effort normal and breath sounds normal.  Abdominal: Soft. She exhibits no distension. There is no tenderness.  Musculoskeletal: Normal range of motion.  Neurological: She is alert and oriented to person, place, and time.  Skin: Skin is warm and dry.  Psychiatric: She has a normal mood and affect. Judgment normal.    ED Course  Procedures (including critical care time) Labs Review Labs Reviewed - No data to display Imaging Review No results found.   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  She's been compliant with her Depakote and therefore will not check a level this time.  Discharge home in good condition.    Hoy Morn, MD 08/29/13 [phone]

## 2013-08-29 NOTE — ED Notes (Signed)
nad noted prior to dc. Dc instructions reviewed and explained. Voiced understanding.

## 2013-09-01 ENCOUNTER — Other Ambulatory Visit: Payer: Self-pay

## 2013-09-01 DIAGNOSIS — G8918 Other acute postprocedural pain: Secondary | ICD-10-CM

## 2013-09-01 MED ORDER — OXYCODONE-ACETAMINOPHEN 5-325 MG PO TABS: 1.0000 | ORAL_TABLET | Freq: Three times a day (TID) | ORAL | Status: DC | PRN

## 2013-09-01 NOTE — Telephone Encounter (Signed)
RX refill for Percocet 5/325 mg 1 every 8 hours prn pain, no refills. printed out and Dr Cyndia Bent signed RX. Ms Paternostro was instructed to take prn only. She needs to start weaning herself off.

## 2013-09-02 ENCOUNTER — Other Ambulatory Visit: Payer: Self-pay

## 2013-09-02 DIAGNOSIS — G8918 Other acute postprocedural pain: Secondary | ICD-10-CM

## 2013-09-02 MED ORDER — OXYCODONE-ACETAMINOPHEN 5-325 MG PO TABS: 1.0000 | ORAL_TABLET | Freq: Three times a day (TID) | ORAL | Status: DC | PRN

## 2013-09-02 NOTE — Telephone Encounter (Signed)
RX re-printed and Dr Servando Snare signed. Oxycodone 5/325 mg.

## 2013-09-07 ENCOUNTER — Ambulatory Visit (INDEPENDENT_AMBULATORY_CARE_PROVIDER_SITE_OTHER): Payer: Self-pay | Admitting: Thoracic Surgery (Cardiothoracic Vascular Surgery)

## 2013-09-07 ENCOUNTER — Encounter: Payer: Self-pay | Admitting: Thoracic Surgery (Cardiothoracic Vascular Surgery)

## 2013-09-07 VITALS — BP 120/86 | HR 85 | Resp 16 | Ht 61.0 in | Wt 140.0 lb

## 2013-09-07 DIAGNOSIS — Z9889 Other specified postprocedural states: Secondary | ICD-10-CM

## 2013-09-07 DIAGNOSIS — Z902 Acquired absence of lung [part of]: Secondary | ICD-10-CM

## 2013-09-07 DIAGNOSIS — C3410 Malignant neoplasm of upper lobe, unspecified bronchus or lung: Secondary | ICD-10-CM

## 2013-09-07 NOTE — Progress Notes (Signed)
  HPI:  [name] returns today with a chief complaint of pain and a lump over the lower part of her right rib cage.  She presented to the ED on March 29 with a complaint of a seizure and new (or increased) right-sided chest pain. She states that she feels a lot on the front of her rib cage on the right side. She's not had any fevers or chills. She denies shortness of breath.  Past Medical History  Diagnosis Date  . HIV (human immunodeficiency virus infection)     takes Stribild daily  . Substance abuse   . Asthma     uses Albuterol inhaler daily as needed  . Shortness of breath     sitting/lying/exertion  . Headache(784.0)   . History of migraine     last one about 2wks ago  . Seizures     takes Depakote daily-last one over a week ago  . Dizziness   . Dysphagia   . Urinary frequency   . Nocturia   . Depression     takes Trazodone and Seroquel nightly      Current Outpatient Prescriptions  Medication Sig Dispense Refill  . albuterol (PROVENTIL HFA;VENTOLIN HFA) 108 (90 BASE) MCG/ACT inhaler Inhale 2 puffs into the lungs every 4 (four) hours as needed for wheezing.  1 Inhaler  0  . divalproex (DEPAKOTE) 250 MG DR tablet Take 3 tablets (750 mg total) by mouth 2 (two) times daily. Take 1 tab in the morning and 2 tabs at night  90 tablet  0  . elvitegravir-cobicistat-emtricitabine-tenofovir (STRIBILD) 150-150-200-300 MG TABS tablet Take 1 tablet by mouth daily with breakfast.  30 tablet  6  . oxyCODONE-acetaminophen (PERCOCET/ROXICET) 5-325 MG per tablet Take 1 tablet by mouth every 8 (eight) hours as needed for severe pain.  50 tablet  0  . QUEtiapine (SEROQUEL) 200 MG tablet Take 200 mg by mouth at bedtime.      . TRAZODONE HCL PO Take 200 mg by mouth daily.       No current facility-administered medications for this visit.    Physical Exam BP 120/86  Pulse 85  Resp 16  Ht 5' 1"  (1.549 m)  Wt 140 lb (63.504 kg)  BMI 26.47 kg/m2  SpO2 99%  LMP 08/15/2013 43 year old  woman in no acute distress Incisions well healed Lungs clear but slightly diminished on the right Mild tenderness to palpation along the right costal margin  Impression: 43 year old woman who is now 2 months post thoracoscopic right upper lobectomy. She has a fairly typical complaint which is pain along the costal margin and then a lump anterior to that. 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  Plan:  Continue current pain regimen  Followup is scheduled in about a month

## 2013-09-13 NOTE — Progress Notes (Unsigned)
Patient ID: Elizabeth [name], female   DOB: 1971/03/26, 43 y.o.   MRN: [id number] Ct. Closed from [facility] services with [facility] due to CM being unable to locate client.  Ct. Has N/S several appts with CM and has no working phone numbers.  CM will follow up with bridge counselor. Allyson

## 2013-09-15 ENCOUNTER — Ambulatory Visit: Payer: Self-pay

## 2013-09-15 ENCOUNTER — Other Ambulatory Visit: Payer: Self-pay

## 2013-09-15 ENCOUNTER — Ambulatory Visit: Payer: Self-pay | Admitting: Internal Medicine

## 2013-09-15 ENCOUNTER — Telehealth: Payer: Self-pay | Admitting: *Deleted

## 2013-09-15 DIAGNOSIS — G8918 Other acute postprocedural pain: Secondary | ICD-10-CM

## 2013-09-15 MED ORDER — OXYCODONE-ACETAMINOPHEN 5-325 MG PO TABS: 1.0000 | ORAL_TABLET | Freq: Three times a day (TID) | ORAL | Status: DC | PRN

## 2013-09-15 NOTE — Telephone Encounter (Signed)
Left message to call for new appt.

## 2013-09-15 NOTE — Telephone Encounter (Signed)
RX for Oxycodone-acetaminophen 5/325 mg 1 po every 8 hours prn/pain #50 no refills printed out and Dr Cyndia Bent signed RX. Pt was instructed that this is the last pain RX that will be given.

## 2013-09-28 ENCOUNTER — Ambulatory Visit: Payer: Self-pay | Admitting: Internal Medicine

## 2013-09-29 ENCOUNTER — Ambulatory Visit: Payer: Self-pay

## 2013-09-30 ENCOUNTER — Ambulatory Visit: Payer: Self-pay

## 2013-09-30 ENCOUNTER — Other Ambulatory Visit: Payer: Self-pay

## 2013-10-05 ENCOUNTER — Emergency Department (HOSPITAL_COMMUNITY): Payer: Medicaid Other

## 2013-10-05 ENCOUNTER — Emergency Department (HOSPITAL_COMMUNITY)
Admission: EM | Admit: 2013-10-05 | Discharge: 2013-10-05 | Disposition: A | Discharge status: Home / Self Care | Payer: Medicaid Other | Attending: Emergency Medicine | Admitting: Emergency Medicine

## 2013-10-05 ENCOUNTER — Encounter (HOSPITAL_COMMUNITY): Payer: Self-pay | Admitting: Emergency Medicine

## 2013-10-05 DIAGNOSIS — R05 Cough: Secondary | ICD-10-CM | POA: Insufficient documentation

## 2013-10-05 DIAGNOSIS — Z79899 Other long term (current) drug therapy: Secondary | ICD-10-CM | POA: Diagnosis not present

## 2013-10-05 DIAGNOSIS — Z8679 Personal history of other diseases of the circulatory system: Secondary | ICD-10-CM | POA: Diagnosis not present

## 2013-10-05 DIAGNOSIS — Z85118 Personal history of other malignant neoplasm of bronchus and lung: Secondary | ICD-10-CM | POA: Diagnosis not present

## 2013-10-05 DIAGNOSIS — R042 Hemoptysis: Secondary | ICD-10-CM

## 2013-10-05 DIAGNOSIS — F17200 Nicotine dependence, unspecified, uncomplicated: Secondary | ICD-10-CM | POA: Insufficient documentation

## 2013-10-05 DIAGNOSIS — R059 Cough, unspecified: Secondary | ICD-10-CM

## 2013-10-05 DIAGNOSIS — G40909 Epilepsy, unspecified, not intractable, without status epilepticus: Secondary | ICD-10-CM | POA: Insufficient documentation

## 2013-10-05 DIAGNOSIS — F329 Major depressive disorder, single episode, unspecified: Secondary | ICD-10-CM | POA: Insufficient documentation

## 2013-10-05 DIAGNOSIS — J45901 Unspecified asthma with (acute) exacerbation: Secondary | ICD-10-CM | POA: Insufficient documentation

## 2013-10-05 DIAGNOSIS — F3289 Other specified depressive episodes: Secondary | ICD-10-CM | POA: Diagnosis not present

## 2013-10-05 DIAGNOSIS — Z21 Asymptomatic human immunodeficiency virus [HIV] infection status: Secondary | ICD-10-CM | POA: Insufficient documentation

## 2013-10-05 HISTORY — DX: Malignant neoplasm of unspecified part of unspecified bronchus or lung: C34.90

## 2013-10-05 LAB — CBC WITH DIFFERENTIAL/PLATELET
BASOS ABS: 0 10*3/uL (ref 0.0–0.1)
BASOS PCT: 0 % (ref 0–1)
EOS PCT: 3 % (ref 0–5)
Eosinophils Absolute: 0.1 10*3/uL (ref 0.0–0.7)
HEMATOCRIT: 36.4 % (ref 36.0–46.0)
Hemoglobin: 13.1 g/dL (ref 12.0–15.0)
Lymphocytes Relative: 42 % (ref 12–46)
Lymphs Abs: 1.8 10*3/uL (ref 0.7–4.0)
MCH: 30.1 pg (ref 26.0–34.0)
MCHC: 36 g/dL (ref 30.0–36.0)
MCV: 83.7 fL (ref 78.0–100.0)
MONO ABS: 0.3 10*3/uL (ref 0.1–1.0)
Monocytes Relative: 7 % (ref 3–12)
Neutro Abs: 2.1 10*3/uL (ref 1.7–7.7)
Neutrophils Relative %: 48 % (ref 43–77)
PLATELETS: 298 10*3/uL (ref 150–400)
RBC: 4.35 MIL/uL (ref 3.87–5.11)
RDW: 13.6 % (ref 11.5–15.5)
WBC: 4.4 10*3/uL (ref 4.0–10.5)

## 2013-10-05 LAB — BASIC METABOLIC PANEL
BUN: 14 mg/dL (ref 6–23)
CO2: 23 meq/L (ref 19–32)
CREATININE: 0.56 mg/dL (ref 0.50–1.10)
Calcium: 9.1 mg/dL (ref 8.4–10.5)
Chloride: 101 mEq/L (ref 96–112)
GFR calc Af Amer: >90 mL/min (ref >90)
GLUCOSE: 97 mg/dL (ref 70–99)
Potassium: 4.6 mEq/L (ref 3.7–5.3)
Sodium: 136 mEq/L — ABNORMAL LOW (ref 137–147)

## 2013-10-05 LAB — TROPONIN I: Troponin I: <0.3 ng/mL (ref <0.30)

## 2013-10-05 MED ORDER — ALBUTEROL SULFATE HFA 108 (90 BASE) MCG/ACT IN AERS: 2.0000 | INHALATION_SPRAY | RESPIRATORY_TRACT | Status: DC | PRN

## 2013-10-05 MED ORDER — KETOROLAC TROMETHAMINE 30 MG/ML IJ SOLN
30.0000 mg | Freq: Once | INTRAMUSCULAR | Status: AC
  Administered 2013-10-05: 30 mg via INTRAVENOUS
  Filled 2013-10-05: qty 1

## 2013-10-05 MED ORDER — IOHEXOL 350 MG/ML SOLN
100.0000 mL | Freq: Once | INTRAVENOUS | Status: AC | PRN
  Administered 2013-10-05: 100 mL via INTRAVENOUS

## 2013-10-05 NOTE — Discharge Instructions (Signed)
Your testing has not shown a definite source of coughing up blood - your testing is reassuring that it is not a recurrent cancer or a blood clot or pneumonia.  You should call Dr. Gustavus Bryant office in the morning to arrange follow up as you may need a Bronchoscopy - they are the specialists in this area and can  Help you coordinate your care.  Please call your doctor for a followup appointment within 24-48 hours. When you talk to your doctor please let them know that you were seen in the emergency department and have them acquire all of your records so that they can discuss the findings with you and formulate a treatment plan to fully care for your new and ongoing problems.

## 2013-10-05 NOTE — ED Notes (Signed)
Pt states she has a hx of lung cancer and had surgery in Feb  Pt states for the past 2 weeks she has been coughing up blood and states it is getting more frequent and the amount is getting more  Pt states she is also having shortness of breath esp on exertion

## 2013-10-05 NOTE — ED Provider Notes (Signed)
CSN: [id number]     Arrival date & time 10/05/13  1956 History   First MD Initiated Contact with Patient 10/05/13 2011     Chief Complaint  Patient presents with  . Hemoptysis     (Consider location/radiation/quality/duration/timing/severity/associated sxs/prior Treatment) HPI Comments: 43 year old female with a history of HIV, seizure disorder and lung cancer. She underwent right upper lobectomy approximately 3 months ago.  This was for non-small cell lung cancer, there was no further treatment needed as this was a complete resection of the tumor. The patient has had approximately 2 weeks of a cough productive of bloody phlegm. She states that every time she coughs she has a small amount of time signs blood that is productive. She has had associated dyspnea on exertion which has prevented her from going up and down the stairs without having to rest for a significant amount of time. She does smoke cigarettes but denies any hypertension or diabetes and no history of heart disease.  Hemoptysis his daily, persistent, nothing makes this better or worse, not on blood thinners. Last CD4 count was over 800 3 months ago.  The history is provided by the patient.    Past Medical History  Diagnosis Date  . HIV (human immunodeficiency virus infection)     takes Stribild daily  . Substance abuse   . Asthma     uses Albuterol inhaler daily as needed  . Shortness of breath     sitting/lying/exertion  . Headache(784.0)   . History of migraine     last one about 2wks ago  . Seizures     takes Depakote daily-last one over a week ago  . Dizziness   . Dysphagia   . Urinary frequency   . Nocturia   . Depression     takes Trazodone and Seroquel nightly  . Lung cancer    Past Surgical History  Procedure Laterality Date  . Cesarean section  79yr ago  . Video bronchoscopy Bilateral 05/05/2013    Procedure: VIDEO BRONCHOSCOPY WITH FLUORO;  Surgeon: MTanda Rockers MD;  Location: [facility];  Service:  Cardiopulmonary;  Laterality: Bilateral;  . Video assisted thoracoscopy (vats)/wedge resection Right 07/05/2013    Procedure: VIDEO ASSISTED THORACOSCOPY (VATS)/WEDGE RESECTION, Right Upper Lobectomy with lymph node disecction and OnQ placement;  Surgeon: SMelrose Nakayama MD;  Location: [facility];  Service: Thoracic;  Laterality: Right;  (R)VATS, WEDGE RESECTION, POSSIBLE LOBECTOMY, POSSIBLE CHEST WALL RESECTION   Family History  Problem Relation Age of Onset  . Diabetes Mother   . CAD Mother   . Hypertension Mother   . Hypertension Sister   . Lung cancer Maternal Aunt     smoked  . Allergies Sister   . Asthma Sister   . Lung cancer Maternal Uncle     never smoker  . Lung cancer Maternal Grandmother     never smoker   History  Substance Use Topics  . Smoking status: Current Every Day Smoker -- 0.50 packs/day for 24 years    Types: Cigarettes  . Smokeless tobacco: Never Used  . Alcohol Use: No   OB History   Grav Para Term Preterm Abortions TAB SAB Ect Mult Living                 Review of Systems  All other systems reviewed and are negative.     Allergies  Gabapentin  Home Medications   Prior to Admission medications   Medication Sig Start Date End Date Taking? Authorizing  Provider  albuterol (PROVENTIL HFA;VENTOLIN HFA) 108 (90 BASE) MCG/ACT inhaler Inhale 2 puffs into the lungs every 4 (four) hours as needed for wheezing. 03/30/13   Idalia Needle. Sanford, PA-C  divalproex (DEPAKOTE) 250 MG DR tablet Take 3 tablets (750 mg total) by mouth 2 (two) times daily. Take 1 tab in the morning and 2 tabs at night 06/10/13   Blanchie Dessert, MD  elvitegravir-cobicistat-emtricitabine-tenofovir (STRIBILD) 150-150-200-300 MG TABS tablet Take 1 tablet by mouth daily with breakfast. 05/10/13   Thayer Headings, MD  oxyCODONE-acetaminophen (PERCOCET/ROXICET) 5-325 MG per tablet Take 1 tablet by mouth every 8 (eight) hours as needed for severe pain. 09/15/13   Gaye Pollack, MD  QUEtiapine  (SEROQUEL) 200 MG tablet Take 200 mg by mouth at bedtime.    Historical Provider, MD  TRAZODONE HCL PO Take 200 mg by mouth daily.    Historical Provider, MD   BP 153/101  Pulse 83  Temp(Src) 97.9 F (36.6 C) (Oral)  Resp 17  Ht 5' 1"  (1.549 m)  Wt 143 lb (64.864 kg)  BMI 27.03 kg/m2  SpO2 100%  LMP 10/01/2013 Physical Exam  Nursing note and vitals reviewed. Constitutional: She appears well-developed and well-nourished. No distress.  HENT:  Head: Normocephalic and atraumatic.  Mouth/Throat: Oropharynx is clear and moist. No oropharyngeal exudate.  Eyes: Conjunctivae and EOM are normal. Pupils are equal, round, and reactive to light. Right eye exhibits no discharge. Left eye exhibits no discharge. No scleral icterus.  Neck: Normal range of motion. Neck supple. No JVD present. No thyromegaly present.  Cardiovascular: Normal rate, regular rhythm, normal heart sounds and intact distal pulses.  Exam reveals no gallop and no friction rub.   No murmur heard. Pulmonary/Chest: Effort normal. No respiratory distress. She has wheezes ( mild diffuse wheezing in all lung fields.  speaks in full sentences). She has no rales.  Abdominal: Soft. Bowel sounds are normal. She exhibits no distension and no mass. There is no tenderness.  Musculoskeletal: Normal range of motion. She exhibits no edema and no tenderness.  Lymphadenopathy:    She has no cervical adenopathy.  Neurological: She is alert. Coordination normal.  Skin: Skin is warm and dry. No rash noted. No erythema.  Psychiatric: She has a normal mood and affect. Her behavior is normal.    ED Course  Procedures (including critical care time) Labs Review Labs Reviewed  BASIC METABOLIC PANEL - Abnormal; Notable for the following:    Sodium 136 (*)    All other components within normal limits  TROPONIN I  CBC WITH DIFFERENTIAL  CBC WITH DIFFERENTIAL    Imaging Review Dg Chest 2 View  10/05/2013   CLINICAL DATA:  Shortness of breath,  cough, lung cancer  EXAM: CHEST  2 VIEW  COMPARISON:  08/03/2013  FINDINGS: Cardiomediastinal silhouette is stable. Stable postoperative changes right hemithorax. Surgical clips in right axilla again noted. No acute infiltrate or pulmonary edema. Bony thorax is stable.  IMPRESSION: No acute infiltrate or pulmonary edema. Stable volume loss and postoperative changes right hemithorax.   Electronically Signed   By: Lahoma Crocker M.D.   On: 10/05/2013 21:35   Ct Angio Chest Pe W/cm &/or Wo Cm  10/05/2013   CLINICAL DATA:  Shortness of breath, hemoptysis  EXAM: CT ANGIOGRAPHY CHEST WITH CONTRAST  TECHNIQUE: Multidetector CT imaging of the chest was performed using the standard protocol during bolus administration of intravenous contrast. Multiplanar CT image reconstructions and MIPs were obtained to evaluate the vascular anatomy.  CONTRAST:  152m OMNIPAQUE IOHEXOL 350 MG/ML SOLN  COMPARISON:  DG CHEST 2 VIEW dated 10/05/2013; DG CHEST 2 VIEW dated 08/03/2013; CT CHEST W/CM dated 05/11/2013; DG CHEST 2 VIEW dated 03/29/2013  FINDINGS: There is adequate opacification of the pulmonary arteries. There is no pulmonary embolus. The main pulmonary artery, right main pulmonary artery and left main pulmonary arteries are normal in size. The heart size is normal. There is no pericardial effusion.  There is evidence of prior right upper lobectomy. There are postsurgical changes in the right lung with right lung volume loss. A  There is no axillary, hilar, or mediastinal adenopathy.  There is no lytic or blastic osseous lesion.  The visualized portions of the upper abdomen are unremarkable.  Review of the MIP images confirms the above findings.  IMPRESSION: 1. No evidence of pulmonary embolus. 2. Postsurgical changes involving the right lung with right lung volume loss. No recurrent or residual malignancy.   Electronically Signed   By: HKathreen Devoid  On: 10/05/2013 22:52     EKG Interpretation   Date/Time:  Tuesday Oct 05 2013  20:39:54 EDT Ventricular Rate:  82 PR Interval:  147 QRS Duration: 104 QT Interval:  397 QTC Calculation: 464 R Axis:   51 Text Interpretation:  Sinus rhythm Normal ECG Since last tracing QT has  shortened Confirmed by Azari Janssens  MD, BMulberry([id number] on 10/05/2013 8:46:54 PM      MDM   Final diagnoses:  Hemoptysis  Cough    Overall the patient is in no acute distress, normal oxygenation, normal blood pressure, normal pulse and no fever. There is a question as to the etiology of the patient's hemoptysis, would consider related to the vats procedure, related to pleural effusion or pneumonia, related to possible pulmonary embolism. Chest x-ray and labs pending at this time. Supplemental oxygen offered. Dyspnea on exertion will be evaluated EKG  CT scan is reassuring that this is not a blood clot nor is it related to acute lung injury or recurrent mass. Labs unremarkable, including CBC, basic metabolic panel and troponin and EKG is nonischemic. The patient appears overall very stable for discharge, she has had 2 weeks of a small amount of hemoptysis with coughing, this is not large volume, she can followup for bronchoscopy and has seen pulmonology in the past. I will refer her back to her pulmonologist.  Meds given in ED:  Medications  ketorolac (TORADOL) 30 MG/ML injection 30 mg (30 mg Intravenous Given 10/05/13 2230)  iohexol (OMNIPAQUE) 350 MG/ML injection 100 mL (100 mLs Intravenous Contrast Given 10/05/13 2221)    New Prescriptions   ALBUTEROL (PROVENTIL HFA;VENTOLIN HFA) 108 (90 BASE) MCG/ACT INHALER    Inhale 2 puffs into the lungs every 4 (four) hours as needed for wheezing or shortness of breath.      BJohnna Acosta MD 10/05/13 [phone]

## 2013-10-08 ENCOUNTER — Ambulatory Visit (INDEPENDENT_AMBULATORY_CARE_PROVIDER_SITE_OTHER): Payer: Self-pay | Admitting: Internal Medicine

## 2013-10-08 ENCOUNTER — Encounter: Payer: Self-pay | Admitting: Internal Medicine

## 2013-10-08 VITALS — BP 106/62 | HR 84 | Temp 98.4°F | Ht 61.0 in | Wt 139.0 lb

## 2013-10-08 DIAGNOSIS — R042 Hemoptysis: Secondary | ICD-10-CM

## 2013-10-08 DIAGNOSIS — G8912 Acute post-thoracotomy pain: Secondary | ICD-10-CM

## 2013-10-08 DIAGNOSIS — F17200 Nicotine dependence, unspecified, uncomplicated: Secondary | ICD-10-CM

## 2013-10-08 MED ORDER — OXYCODONE-ACETAMINOPHEN 5-325 MG PO TABS: 1.0000 | ORAL_TABLET | ORAL | Status: DC | PRN

## 2013-10-08 MED ORDER — TRAZODONE HCL 150 MG PO TABS: ORAL_TABLET | ORAL | Status: DC

## 2013-10-08 MED ORDER — AZITHROMYCIN 250 MG PO TABS: ORAL_TABLET | ORAL | Status: DC

## 2013-10-08 NOTE — Patient Instructions (Addendum)
Trazadone 200 mg take one half every night perfectly regularly and if you still can't tolerate the side effect we need to try you on the 50 mg strength and build up   Percocet every 4 hours as needed for cough or pain   The key is to stop smoking completely before smoking completely stops you!   Zpak  zostrix cream 4 x daily along the distribution of the pain (over the counter)  Pulmonary follow up is as needed but we really can't promise to help if you can't stop smoking

## 2013-10-09 DIAGNOSIS — G8912 Acute post-thoracotomy pain: Secondary | ICD-10-CM | POA: Insufficient documentation

## 2013-10-09 NOTE — Assessment & Plan Note (Signed)
S/p R VATS 07/05/2013  - Try trazadone 75 mg daily plus zostrix 10/08/13   Classic anatomic distribution and assoc hyperasthesia along rib distribution.  Discussed briefly gate theory of pain > f/u pain clinic if not improving with conservative rx and no more refills of percocet

## 2013-10-09 NOTE — Progress Notes (Signed)
Subjective:    Patient ID: Elizabeth [name], female    DOB: April 26, 1971  MRN: [id number]   Brief patient profile:  72 yowf HIV since 2008 actively smoking referred to pulmonary clinic 04/16/2013 by Elizabeth comer with sob and cough and RUL density with plans for excisional bx 07/05/13.    History of Present Illness  04/16/2013 1st [facility] office visit/ Elizabeth [name]  Chief Complaint  Patient presents with  . Pulmonary Consult    Referred per Elizabeth. Gara Kroner for eval of abn CT Chest. The pt c/o CP and dyspnea x 5 months. She states that she gets out of breath just walking from one room to the next and wakes up gasping for air. She also c/o prod cough with minimal yellow sputum x 5 months.    cp anterior x onset early Oct 2014 , worse with cough. Sputum never bloody  zpak helped some  saba helps doe some  Poor dentition and having active Seizures since July 2013 (denies ever seeing neuorlogist as outpt) rec The key is to stop smoking completely before smoking completely stops you!  Augmentin 875 mg take one pill twice daily  X 10 days       04/30/2013 f/u ov/Elizabeth [name] still smoking  re: RUL density  Chief Complaint  Patient presents with  . Follow-up    Pt c/o SOB constantly, waking her up at night.  Pt went to ED Saturday for seizures. Pt feels worse now than at her lv 2wks ago.   Hurt worse since ran out of pain meds but was needing to up to 2 every 4 hours until 3 days prior to OV  And no better p augmentin. Mucus remains dark but not bloody. Since ov had multiple sz's but not seeing in neurologist Doe and at rest not better p saba Pain / sob constant now 24/7 and generalized anterior on R, localized on R post below scapula tip rec Fob > done 05/05/13 with hemoptysis immediately p procedure   05/08/2013 acute  ov/Elizabeth [name] re: hemoptysis/ gagging from coughing  Chief Complaint  Patient presents with  . Acute Visit    pt c/o nausea, cannot eat anything. Pt also c/o coughing up blood since  procedure. I advised the pt that this is normal, but triage advised her to come in for OV.    no sob at rest Amt of brb is tapering off to less than a tsp on day of ov - total amt about a quarter cup mixed with saliva. rec Keep taking the hydrocodone up to 2 every 6 hours for pain or cough For nausea phenergan  12.5 mg 1-2 every 6 hours as needed  Soups, broth based first, then add noodles and crackers  Last = veggies/ salads/ dairy products We will arrange for PET scan next > c/w ? Limited  ca so referred for T surg    06/29/2013 f/u ov/Elizabeth [name] re: pain control  Chief Complaint  Patient presents with  . Follow-up    Pt here to discuss pain management- Elizabeth [name] advised he would not prescribe anything until after her surgery 07/05/13.     Percocet 2 every 6 = 8 per day = 60 needed until surgery (delayed by surg and eval at [facility]) Still smoking  rec Ok to continue to take the percocet up to 2 every 6 hours if needed to control the pain Keep all follow up appts with Elizabeth [name] who will order your post op pain meds and refer you either  back here or to the appropriate specialist for out patient follow up once we know the diagnsosis The key is to stop smoking completely before smoking completely stops you- this must be done now to reduce your risk of pulmonary complications from surgery     T surgery   wedge 07/05/13  POS [facility] Ca T2 N0 Mx   10/08/2013 acute ov/Elizabeth [name] re: post op CP/ hemoptysis/ still smoking Chief Complaint  Patient presents with  . Acute Visit    Pt c/o hemoptysis x 2 wks, and still having pain in her rt side. Her breathing has been worse since her lung surgery. She states using albuterol inhaler at least 3 times per day.   the pain on R is completely different that preop post cp and assoc with area of burning numbness along rib distribution and 6th ICS on right all the way anteriorly to midline  Hemoptysis is only streaky with no more than a tsp or two per day total,  no purulent sputum and no epistaxis   No obvious day to day or daytime variabilty or assoc  chest tightness, subjective wheeze overt sinus or hb symptoms. No unusual exp hx or h/o childhood pna/ asthma or knowledge of premature birth.    Also denies any obvious fluctuation of symptoms with weather or environmental changes or other aggravating or alleviating factors except as outlined above   Current Medications, Allergies, Complete Past Medical History, Past Surgical History, Family History, and Social History were reviewed in [employer] record.  ROS  The following are not active complaints unless bolded sore throat, dysphagia, dental problems, itching, sneezing,  nasal congestion or excess/ purulent secretions, ear ache,   fever, chills, sweats, unintended wt loss, pleuritic or exertional cp, hemoptysis,  orthopnea pnd or leg swelling, presyncope, palpitations, heartburn, abdominal pain, anorexia, nausea, vomiting, diarrhea  or change in bowel or urinary habits, change in stools or urine, dysuria,hematuria,  rash, arthralgias, visual complaints, headache, numbness weakness or ataxia or problems with walking or coordination,  change in mood/affect or memory.                  Objective:   Physical Exam   Anxious wf nad  .06/29/2013      149 > 10/09/2013 137 Wt Readings from Last 3 Encounters:  05/06/13 149 lb 12.8 oz (67.949 kg)  04/30/13 149 lb (67.586 kg)  04/16/13 149 lb (67.586 kg)        HEENT mild turbinate edema.  Oropharynx no thrush or excess pnd or cobblestoning.  No JVD or cervical adenopathy. Mild accessory muscle hypertrophy. Trachea midline, nl thryroid. Chest was hyperinflated by percussion with diminished breath sounds and moderate increased exp time without wheeze. Hoover sign positive at mid inspiration. Regular rate and rhythm without murmur gallop or rub or increase P2 or edema.  Abd: no hsm, nl excursion. Ext warm without cyanosis or  clubbing.  No rash or cw tenderness   CTa  10/05/13 Post op changes only     Assessment & Plan:

## 2013-10-09 NOTE — Assessment & Plan Note (Signed)

## 2013-10-09 NOTE — Assessment & Plan Note (Addendum)
Probably related to bronchitis from smoking, no evidence recurrent ca by CTa 10/05/13 and never had airway involvment rec stop smoking completely / rx zpak x one / control cough with percocet

## 2013-10-15 ENCOUNTER — Telehealth: Payer: Self-pay | Admitting: Internal Medicine

## 2013-10-15 MED ORDER — OXYCODONE-ACETAMINOPHEN 5-325 MG PO TABS: 1.0000 | ORAL_TABLET | ORAL | Status: DC | PRN

## 2013-10-15 NOTE — Telephone Encounter (Signed)
Does not appear to be my patient. Also, I am busy in ICU at [facility]. Please forward to appropriate doc. . If is meant for me, then apologize, please send back   Thanks  Dr. Brand Males, M.D., [facility].C.P Pulmonary and Critical Care Medicine Staff Physician [facility] Pager: [phone], If no answer or between  15:00h - 7:00h: call [phone]  10/15/2013 1:23 PM

## 2013-10-15 NOTE — Telephone Encounter (Signed)
Ok x 60 no refills, needs to try to make this last until she sees Henrdickson

## 2013-10-15 NOTE — Telephone Encounter (Signed)
Per 10/08/13: Patient Instructions      Trazadone 200 mg take one half every night perfectly regularly and if you still can't tolerate the side effect we need to try you on the 50 mg strength and build up  Percocet every 4 hours as needed for cough or pain  The key is to stop smoking completely before smoking completely stops you!  Zpak zostrix cream 4 x daily along the distribution of the pain (over the counter) Pulmonary follow up is as needed but we really can't promise to help if you can't stop smoking  --  Called spoke with pt. She see's Dr. Roxan Hockey 11/09/13. She reports has only 1 percocet left and is wanting a refill. Pt received refill 10/08/13 #40 x o refills. Please advise MW thanks

## 2013-10-15 NOTE — Telephone Encounter (Signed)
Pt is aware of MW recs. RX printed, signed and placed for pick up

## 2013-10-20 ENCOUNTER — Emergency Department (HOSPITAL_COMMUNITY): Payer: Medicaid Other

## 2013-10-20 ENCOUNTER — Encounter (HOSPITAL_COMMUNITY): Payer: Self-pay | Admitting: Emergency Medicine

## 2013-10-20 ENCOUNTER — Inpatient Hospital Stay (HOSPITAL_COMMUNITY)
Admission: EM | Admit: 2013-10-20 | Discharge: 2013-10-23 | DRG: 190 | Disposition: A | Discharge status: Home / Self Care | Payer: Medicaid Other | Attending: Internal Medicine | Admitting: Internal Medicine

## 2013-10-20 DIAGNOSIS — Z8249 Family history of ischemic heart disease and other diseases of the circulatory system: Secondary | ICD-10-CM | POA: Diagnosis not present

## 2013-10-20 DIAGNOSIS — Z825 Family history of asthma and other chronic lower respiratory diseases: Secondary | ICD-10-CM

## 2013-10-20 DIAGNOSIS — J449 Chronic obstructive pulmonary disease, unspecified: Secondary | ICD-10-CM | POA: Diagnosis present

## 2013-10-20 DIAGNOSIS — F3289 Other specified depressive episodes: Secondary | ICD-10-CM | POA: Diagnosis present

## 2013-10-20 DIAGNOSIS — F17200 Nicotine dependence, unspecified, uncomplicated: Secondary | ICD-10-CM | POA: Diagnosis present

## 2013-10-20 DIAGNOSIS — J9600 Acute respiratory failure, unspecified whether with hypoxia or hypercapnia: Secondary | ICD-10-CM | POA: Diagnosis present

## 2013-10-20 DIAGNOSIS — R0600 Dyspnea, unspecified: Secondary | ICD-10-CM

## 2013-10-20 DIAGNOSIS — R918 Other nonspecific abnormal finding of lung field: Secondary | ICD-10-CM

## 2013-10-20 DIAGNOSIS — Z21 Asymptomatic human immunodeficiency virus [HIV] infection status: Secondary | ICD-10-CM | POA: Diagnosis present

## 2013-10-20 DIAGNOSIS — R0989 Other specified symptoms and signs involving the circulatory and respiratory systems: Secondary | ICD-10-CM

## 2013-10-20 DIAGNOSIS — G894 Chronic pain syndrome: Secondary | ICD-10-CM | POA: Diagnosis present

## 2013-10-20 DIAGNOSIS — B20 Human immunodeficiency virus [HIV] disease: Secondary | ICD-10-CM

## 2013-10-20 DIAGNOSIS — J441 Chronic obstructive pulmonary disease with (acute) exacerbation: Secondary | ICD-10-CM | POA: Diagnosis present

## 2013-10-20 DIAGNOSIS — K219 Gastro-esophageal reflux disease without esophagitis: Secondary | ICD-10-CM | POA: Diagnosis present

## 2013-10-20 DIAGNOSIS — Z833 Family history of diabetes mellitus: Secondary | ICD-10-CM

## 2013-10-20 DIAGNOSIS — F329 Major depressive disorder, single episode, unspecified: Secondary | ICD-10-CM

## 2013-10-20 DIAGNOSIS — Z85118 Personal history of other malignant neoplasm of bronchus and lung: Secondary | ICD-10-CM

## 2013-10-20 DIAGNOSIS — J45901 Unspecified asthma with (acute) exacerbation: Secondary | ICD-10-CM | POA: Diagnosis not present

## 2013-10-20 DIAGNOSIS — G40909 Epilepsy, unspecified, not intractable, without status epilepticus: Secondary | ICD-10-CM | POA: Diagnosis present

## 2013-10-20 DIAGNOSIS — R0609 Other forms of dyspnea: Secondary | ICD-10-CM

## 2013-10-20 DIAGNOSIS — Z801 Family history of malignant neoplasm of trachea, bronchus and lung: Secondary | ICD-10-CM | POA: Diagnosis not present

## 2013-10-20 DIAGNOSIS — R222 Localized swelling, mass and lump, trunk: Secondary | ICD-10-CM

## 2013-10-20 DIAGNOSIS — R0602 Shortness of breath: Secondary | ICD-10-CM | POA: Diagnosis present

## 2013-10-20 DIAGNOSIS — Z888 Allergy status to other drugs, medicaments and biological substances status: Secondary | ICD-10-CM | POA: Diagnosis not present

## 2013-10-20 DIAGNOSIS — N63 Unspecified lump in unspecified breast: Secondary | ICD-10-CM | POA: Diagnosis present

## 2013-10-20 DIAGNOSIS — G8912 Acute post-thoracotomy pain: Secondary | ICD-10-CM

## 2013-10-20 LAB — CBC
HCT: 37.6 % (ref 36.0–46.0)
Hemoglobin: 13 g/dL (ref 12.0–15.0)
MCH: 29.1 pg (ref 26.0–34.0)
MCHC: 34.6 g/dL (ref 30.0–36.0)
MCV: 84.1 fL (ref 78.0–100.0)
Platelets: 249 10*3/uL (ref 150–400)
RBC: 4.47 MIL/uL (ref 3.87–5.11)
RDW: 13.8 % (ref 11.5–15.5)
WBC: 6.7 10*3/uL (ref 4.0–10.5)

## 2013-10-20 LAB — BASIC METABOLIC PANEL
BUN: 11 mg/dL (ref 6–23)
CO2: 23 mEq/L (ref 19–32)
CREATININE: 0.51 mg/dL (ref 0.50–1.10)
Calcium: 9 mg/dL (ref 8.4–10.5)
Chloride: 104 mEq/L (ref 96–112)
GLUCOSE: 112 mg/dL — AB (ref 70–99)
Potassium: 4 mEq/L (ref 3.7–5.3)
Sodium: 137 mEq/L (ref 137–147)

## 2013-10-20 MED ORDER — ALBUTEROL (5 MG/ML) CONTINUOUS INHALATION SOLN
10.0000 mg/h | INHALATION_SOLUTION | Freq: Once | RESPIRATORY_TRACT | Status: AC
Start: 2013-10-20 — End: 2013-10-20
  Administered 2013-10-20: 10 mg/h via RESPIRATORY_TRACT
  Filled 2013-10-20: qty 20

## 2013-10-20 MED ORDER — ONDANSETRON HCL 4 MG/2ML IJ SOLN: 4.0000 mg | Freq: Four times a day (QID) | INTRAMUSCULAR | Status: DC | PRN

## 2013-10-20 MED ORDER — ENOXAPARIN SODIUM 40 MG/0.4ML ~~LOC~~ SOLN
40.0000 mg | SUBCUTANEOUS | Status: DC
  Administered 2013-10-20 – 2013-10-22 (×3): 40 mg via SUBCUTANEOUS
  Filled 2013-10-20 (×4): qty 0.4

## 2013-10-20 MED ORDER — TRAZODONE HCL 50 MG PO TABS
75.0000 mg | ORAL_TABLET | Freq: Every day | ORAL | Status: DC
  Administered 2013-10-20 – 2013-10-23 (×3): 75 mg via ORAL
  Filled 2013-10-20 (×4): qty 1

## 2013-10-20 MED ORDER — MOMETASONE FURO-FORMOTEROL FUM 100-5 MCG/ACT IN AERO
2.0000 | INHALATION_SPRAY | Freq: Two times a day (BID) | RESPIRATORY_TRACT | Status: DC
  Administered 2013-10-20 – 2013-10-21 (×2): 2 via RESPIRATORY_TRACT
  Filled 2013-10-20: qty 8.8

## 2013-10-20 MED ORDER — OXYCODONE-ACETAMINOPHEN 5-325 MG PO TABS
1.0000 | ORAL_TABLET | Freq: Once | ORAL | Status: AC
  Administered 2013-10-20: 1 via ORAL
  Filled 2013-10-20: qty 1

## 2013-10-20 MED ORDER — IPRATROPIUM-ALBUTEROL 0.5-2.5 (3) MG/3ML IN SOLN
3.0000 mL | RESPIRATORY_TRACT | Status: DC
  Administered 2013-10-20 – 2013-10-23 (×15): 3 mL via RESPIRATORY_TRACT
  Filled 2013-10-20 (×16): qty 3

## 2013-10-20 MED ORDER — DIVALPROEX SODIUM 250 MG PO DR TAB
250.0000 mg | DELAYED_RELEASE_TABLET | Freq: Every day | ORAL | Status: DC
  Administered 2013-10-21 – 2013-10-23 (×3): 250 mg via ORAL
  Filled 2013-10-20 (×3): qty 1

## 2013-10-20 MED ORDER — METHYLPREDNISOLONE SODIUM SUCC 40 MG IJ SOLR
40.0000 mg | Freq: Four times a day (QID) | INTRAMUSCULAR | Status: AC
  Administered 2013-10-20 – 2013-10-22 (×8): 40 mg via INTRAVENOUS
  Filled 2013-10-20 (×10): qty 1

## 2013-10-20 MED ORDER — ONDANSETRON HCL 4 MG PO TABS: 4.0000 mg | ORAL_TABLET | Freq: Four times a day (QID) | ORAL | Status: DC | PRN

## 2013-10-20 MED ORDER — SODIUM CHLORIDE 0.9 % IJ SOLN
3.0000 mL | Freq: Two times a day (BID) | INTRAMUSCULAR | Status: DC
  Administered 2013-10-21 – 2013-10-22 (×2): 3 mL via INTRAVENOUS

## 2013-10-20 MED ORDER — DOCUSATE SODIUM 100 MG PO CAPS
100.0000 mg | ORAL_CAPSULE | Freq: Two times a day (BID) | ORAL | Status: DC
  Administered 2013-10-20 – 2013-10-23 (×6): 100 mg via ORAL
  Filled 2013-10-20 (×7): qty 1

## 2013-10-20 MED ORDER — METHYLPREDNISOLONE SODIUM SUCC 125 MG IJ SOLR
125.0000 mg | Freq: Once | INTRAMUSCULAR | Status: AC
  Administered 2013-10-20: 125 mg via INTRAVENOUS
  Filled 2013-10-20: qty 2

## 2013-10-20 MED ORDER — IPRATROPIUM BROMIDE 0.02 % IN SOLN
0.5000 mg | Freq: Once | RESPIRATORY_TRACT | Status: AC
  Administered 2013-10-20: 0.5 mg via RESPIRATORY_TRACT
  Filled 2013-10-20: qty 2.5

## 2013-10-20 MED ORDER — ELVITEG-COBIC-EMTRICIT-TENOFDF 150-150-200-300 MG PO TABS
1.0000 | ORAL_TABLET | Freq: Every day | ORAL | Status: DC
  Administered 2013-10-21 – 2013-10-23 (×3): 1 via ORAL
  Filled 2013-10-20 (×4): qty 1

## 2013-10-20 MED ORDER — MORPHINE SULFATE 2 MG/ML IJ SOLN
2.0000 mg | INTRAMUSCULAR | Status: DC | PRN
  Administered 2013-10-20 – 2013-10-22 (×5): 2 mg via INTRAVENOUS
  Filled 2013-10-20 (×5): qty 1

## 2013-10-20 MED ORDER — DIVALPROEX SODIUM 500 MG PO DR TAB
500.0000 mg | DELAYED_RELEASE_TABLET | Freq: Every day | ORAL | Status: DC
  Administered 2013-10-20 – 2013-10-22 (×3): 500 mg via ORAL
  Filled 2013-10-20 (×4): qty 1

## 2013-10-20 MED ORDER — DIVALPROEX SODIUM 250 MG PO DR TAB: 250.0000 mg | DELAYED_RELEASE_TABLET | Freq: Two times a day (BID) | ORAL | Status: DC

## 2013-10-20 MED ORDER — OXYCODONE-ACETAMINOPHEN 5-325 MG PO TABS
1.0000 | ORAL_TABLET | ORAL | Status: DC | PRN
  Administered 2013-10-21 – 2013-10-22 (×4): 1 via ORAL
  Filled 2013-10-20 (×4): qty 1

## 2013-10-20 MED ORDER — SODIUM CHLORIDE 0.9 % IV SOLN
INTRAVENOUS | Status: DC
  Administered 2013-10-20 – 2013-10-23 (×5): via INTRAVENOUS

## 2013-10-20 MED ORDER — QUETIAPINE FUMARATE 200 MG PO TABS
200.0000 mg | ORAL_TABLET | Freq: Every day | ORAL | Status: DC
  Administered 2013-10-20 – 2013-10-22 (×3): 200 mg via ORAL
  Filled 2013-10-20 (×4): qty 1

## 2013-10-20 NOTE — ED Notes (Signed)
43 yo , SOB, Feb 2 had lung surgery, had acute episode tonight and today with SOB and inability to catch breath. 12 lead NSR, given asa and ntg with ems, 114/90.

## 2013-10-20 NOTE — ED Provider Notes (Addendum)
CSN: [id number]     Arrival date & time 10/20/13  1437 History   First MD Initiated Contact with Patient 10/20/13 1504     Chief Complaint  Patient presents with  . Shortness of Breath    Patient is a 43 y.o. female presenting with shortness of breath. The history is provided by the patient.  Shortness of Breath Severity:  Moderate Duration:  1 day Timing:  Constant Progression:  Worsening Chronicity:  Recurrent Context comment:  Pt was just playing with her son yesterday when she started to feel short of breath. Relieved by:  Nothing Worsened by:  Activity Ineffective treatments:  Inhaler Associated symptoms: cough   Associated symptoms: no chest pain and no fever   Pt does have a history of COPD and lung cancer.  She continues to smoke but did not feel well enough today to smoke because of her shortness of breath.  She does have pain in the right side of her chest but has had that since her surgery in February.  She takes percocet regularly.  Past Medical History  Diagnosis Date  . HIV (human immunodeficiency virus infection)     takes Stribild daily  . Substance abuse   . Asthma     uses Albuterol inhaler daily as needed  . Shortness of breath     sitting/lying/exertion  . Headache(784.0)   . History of migraine     last one about 2wks ago  . Seizures     takes Depakote daily-last one over a week ago  . Dizziness   . Dysphagia   . Urinary frequency   . Nocturia   . Depression     takes Trazodone and Seroquel nightly  . Lung cancer    Past Surgical History  Procedure Laterality Date  . Cesarean section  67yr ago  . Video bronchoscopy Bilateral 05/05/2013    Procedure: VIDEO BRONCHOSCOPY WITH FLUORO;  Surgeon: MTanda Rockers MD;  Location: [facility];  Service: Cardiopulmonary;  Laterality: Bilateral;  . Video assisted thoracoscopy (vats)/wedge resection Right 07/05/2013    Procedure: VIDEO ASSISTED THORACOSCOPY (VATS)/WEDGE RESECTION, Right Upper Lobectomy with  lymph node disecction and OnQ placement;  Surgeon: SMelrose Nakayama MD;  Location: [facility];  Service: Thoracic;  Laterality: Right;  (R)VATS, WEDGE RESECTION, POSSIBLE LOBECTOMY, POSSIBLE CHEST WALL RESECTION   Family History  Problem Relation Age of Onset  . Diabetes Mother   . CAD Mother   . Hypertension Mother   . Hypertension Sister   . Lung cancer Maternal Aunt     smoked  . Allergies Sister   . Asthma Sister   . Lung cancer Maternal Uncle     never smoker  . Lung cancer Maternal Grandmother     never smoker   History  Substance Use Topics  . Smoking status: Current Every Day Smoker -- 0.50 packs/day for 24 years    Types: Cigarettes  . Smokeless tobacco: Never Used  . Alcohol Use: No   OB History   Grav Para Term Preterm Abortions TAB SAB Ect Mult Living                 Review of Systems  Constitutional: Negative for fever.  Respiratory: Positive for cough and shortness of breath.   Cardiovascular: Negative for chest pain.  All other systems reviewed and are negative.     Allergies  Gabapentin  Home Medications   Prior to Admission medications   Medication Sig Start Date End Date Taking?  Authorizing Provider  albuterol (PROVENTIL HFA;VENTOLIN HFA) 108 (90 BASE) MCG/ACT inhaler Inhale 2 puffs into the lungs every 4 (four) hours as needed for wheezing or shortness of breath. 10/05/13  Yes Johnna Acosta, MD  divalproex (DEPAKOTE) 250 MG DR tablet Take 250-500 mg by mouth 2 (two) times daily. 1 in am, 2 in pm   Yes Historical Provider, MD  elvitegravir-cobicistat-emtricitabine-tenofovir (STRIBILD) 150-150-200-300 MG TABS tablet Take 1 tablet by mouth daily with breakfast. 05/10/13  Yes Thayer Headings, MD  oxyCODONE-acetaminophen (ROXICET) 5-325 MG per tablet Take 1 tablet by mouth every 4 (four) hours as needed for severe pain (or cough). 10/15/13  Yes Tanda Rockers, MD  QUEtiapine (SEROQUEL) 200 MG tablet Take 200 mg by mouth at bedtime.   Yes Historical Provider,  MD  traZODone (DESYREL) 150 MG tablet Take 75 mg by mouth at bedtime.   Yes Historical Provider, MD   BP 108/69  Pulse 87  Temp(Src) 98.7 F (37.1 C) (Oral)  Resp 18  Wt 139 lb (63.05 kg)  SpO2 99%  LMP 10/01/2013 Physical Exam  Nursing note and vitals reviewed. Constitutional: She appears well-developed and well-nourished. No distress.  HENT:  Head: Normocephalic and atraumatic.  Right Ear: External ear normal.  Left Ear: External ear normal.  Eyes: Conjunctivae are normal. Right eye exhibits no discharge. Left eye exhibits no discharge. No scleral icterus.  Neck: Neck supple. No tracheal deviation present.  Cardiovascular: Normal rate, regular rhythm and intact distal pulses.   Pulmonary/Chest: Effort normal. No stridor. No respiratory distress. She has wheezes. She has no rales.  Abdominal: Soft. Bowel sounds are normal. She exhibits no distension. There is no tenderness. There is no rebound and no guarding.  Musculoskeletal: She exhibits no edema and no tenderness.  Neurological: She is alert. She has normal strength. No cranial nerve deficit (no facial droop, extraocular movements intact, no slurred speech) or sensory deficit. She exhibits normal muscle tone. She displays no seizure activity. Coordination normal.  Skin: Skin is warm and dry. No rash noted.  Psychiatric: She has a normal mood and affect.    ED Course  Procedures (including critical care time) Labs Review Labs Reviewed  BASIC METABOLIC PANEL - Abnormal; Notable for the following:    Glucose, Bld 112 (*)    All other components within normal limits  CBC    Imaging Review Dg Chest 2 View  10/20/2013   CLINICAL DATA:  Shortness of breath, history of lung malignancy on the right status post surgery, ongoing tobacco use  EXAM: CHEST  2 VIEW  COMPARISON:  CT ANGIO CHEST W/CM &/OR WO/CM dated 10/05/2013; DG CHEST 1V PORT dated 07/05/2013; DG CHEST 2 VIEW dated 10/05/2013  FINDINGS: The left lung is mildly hyperinflated  and there is mild shift of the mediastinum from left to right which is stable. Stable soft tissue density in the right hilar region is present. There are surgical clips projecting in the right upper hemi thorax. The cardiac silhouette and pulmonary vascularity are normal. There is no pleural effusion.  IMPRESSION: There are postsurgical changes on the right. There are stable findings of COPD bilaterally. There is no evidence of pneumonia nor CHF or other acute cardiopulmonary disease.   Electronically Signed   By: David  Martinique   On: 10/20/2013 16:41     EKG Interpretation   Date/Time:  Wednesday Oct 20 2013 14:51:24 EDT Ventricular Rate:  93 PR Interval:  128 QRS Duration: 92 QT Interval:  364 QTC Calculation:  452 R Axis:   60 Text Interpretation:  Normal sinus rhythm Nonspecific T wave abnormality ,  new since last tracing Abnormal ECG Confirmed by Saabir Blyth  MD-J, Kashmere Daywalt ([id number])  on 10/20/2013 3:30:41 PM     1703  Pt noting some improvement but still with significant wheezing, feeling short of breath.  MDM   Final diagnoses:  COPD exacerbation    I discussed importance of smoking cessation.  Pt has recurrent copd exacerbation.  Has not responded adequately to initial treatment in the ED.  Appears stable in no danger of airway compromise at this time but warrants admission for COPD exacerbation.  Kathalene Frames, MD 10/20/13 1707  Discussed with PCCM.  Pt ok to be admitted by medical service  Kathalene Frames, MD 10/20/13 [phone]

## 2013-10-20 NOTE — H&P (Addendum)
[facility] History and Physical  Elizabeth [name] VWU:[id number] DOB: 09/02/1970 DOA: 10/20/2013  Referring physician:  PCP: No PCP Per Patient   Chief Complaint: Cough/shortness of breath  HPI: Elizabeth [name] is a 43 y.o. female with a past medical history of 40-pack-year history of tobacco abuse, T2 N0 adenocarcinoma, status post right video-assisted thoracoscopy with right resection of right upper lobe mass performed on 07/05/2013, pathology reporting invasive adenocarcinoma, HIV positive, COPD presenting to the emergency room with complaints of cough, shortness of breath, wheezing progressively worse over the past week. Patient was seen by her pulmonologist Dr. Melvyn Novas on 10/08/2013 at which time she was prescribed Z-Pak. She recently had a CT scan of lungs performed on 10/05/2013 which did not show recurrent or residual malignancy. on today's visit she had a chest x-ray which showed stable findings of COPD bilaterally without evidence of pneumonia or CHF. She complains of ongoing thoracic wall pain to right side as well as having several episodes of nausea and vomiting yesterday. She denies fevers, chills, diarrhea, abdominal pain, hematuria, dysuria. Patient also reports having seizure activity several days ago while at home. There is compliance to all of her medications.                                                                                                                                                                                                                         Review of Systems:  Constitutional:  No weight loss, night sweats, Fevers, chills, fatigue.  HEENT:  No headaches, Difficulty swallowing,Tooth/dental problems,Sore throat,  No sneezing, itching, ear ache, nasal congestion, post nasal drip,  Cardio-vascular:  No chest pain, Orthopnea, PND, swelling in lower extremities, anasarca, dizziness, palpitations  GI:  No heartburn, indigestion, abdominal pain, nausea,  vomiting, diarrhea, change in bowel habits, loss of appetite  Resp:  Positive for shortness of breath, cough, wheezing, and coughing up of blood.No change in color of mucus. No chest wall deformity  Skin:  no rash or lesions.  GU:  no dysuria, change in color of urine, no urgency or frequency. No flank pain.  Musculoskeletal:  No joint pain or swelling. No decreased range of motion. No back pain.  Psych:  No change in mood or affect. No depression or anxiety. No memory loss.   Past Medical History  Diagnosis Date  . HIV (human immunodeficiency virus infection)     takes Stribild daily  . Substance abuse   . Asthma     uses  Albuterol inhaler daily as needed  . Shortness of breath     sitting/lying/exertion  . Headache(784.0)   . History of migraine     last one about 2wks ago  . Seizures     takes Depakote daily-last one over a week ago  . Dizziness   . Dysphagia   . Urinary frequency   . Nocturia   . Depression     takes Trazodone and Seroquel nightly  . Lung cancer    Past Surgical History  Procedure Laterality Date  . Cesarean section  75yr ago  . Video bronchoscopy Bilateral 05/05/2013    Procedure: VIDEO BRONCHOSCOPY WITH FLUORO;  Surgeon: MTanda Rockers MD;  Location: [facility];  Service: Cardiopulmonary;  Laterality: Bilateral;  . Video assisted thoracoscopy (vats)/wedge resection Right 07/05/2013    Procedure: VIDEO ASSISTED THORACOSCOPY (VATS)/WEDGE RESECTION, Right Upper Lobectomy with lymph node disecction and OnQ placement;  Surgeon: SMelrose Nakayama MD;  [facility]  Service: Thoracic;  Laterality: Right;  (R)VATS, WEDGE RESECTION, POSSIBLE LOBECTOMY, POSSIBLE CHEST WALL RESECTION   Social History:  reports that she has been smoking Cigarettes.  She has a 12 pack-year smoking history. She has never used smokeless tobacco. She reports that she does not drink alcohol or use illicit drugs.  Allergies  Allergen Reactions  . Gabapentin Swelling     Family History  Problem Relation Age of Onset  . Diabetes Mother   . CAD Mother   . Hypertension Mother   . Hypertension Sister   . Lung cancer Maternal Aunt     smoked  . Allergies Sister   . Asthma Sister   . Lung cancer Maternal Uncle     never smoker  . Lung cancer Maternal Grandmother     never smoker     Prior to Admission medications   Medication Sig Start Date End Date Taking? Authorizing Provider  albuterol (PROVENTIL HFA;VENTOLIN HFA) 108 (90 BASE) MCG/ACT inhaler Inhale 2 puffs into the lungs every 4 (four) hours as needed for wheezing or shortness of breath. 10/05/13  Yes BJohnna Acosta MD  divalproex (DEPAKOTE) 250 MG DR tablet Take 250-500 mg by mouth 2 (two) times daily. 1 in am, 2 in pm   Yes Historical Provider, MD  elvitegravir-cobicistat-emtricitabine-tenofovir (STRIBILD) 150-150-200-300 MG TABS tablet Take 1 tablet by mouth daily with breakfast. 05/10/13  Yes RThayer Headings MD  oxyCODONE-acetaminophen (ROXICET) 5-325 MG per tablet Take 1 tablet by mouth every 4 (four) hours as needed for severe pain (or cough). 10/15/13  Yes MTanda Rockers MD  QUEtiapine (SEROQUEL) 200 MG tablet Take 200 mg by mouth at bedtime.   Yes Historical Provider, MD  traZODone (DESYREL) 150 MG tablet Take 75 mg by mouth at bedtime.   Yes Historical Provider, MD   Physical Exam: Filed Vitals:   10/20/13 1814  BP: 109/68  Pulse: 95  Temp: 98.2 F (36.8 C)  Resp: 16    BP 109/68  Pulse 95  Temp(Src) 98.2 F (36.8 C) (Oral)  Resp 16  Wt 63.05 kg (139 lb)  SpO2 95%  LMP 10/01/2013  General:  Patient appears to be mild distress, she is awake alert oriented cooperative. Audible wheezes Eyes: PERRL, normal lids, irises & conjunctiva ENT: grossly normal hearing, lips & tongue Neck: no LAD, masses or thyromegaly Cardiovascular: RRR, no m/r/g. No LE edema. Telemetry: SR, no arrhythmias  Respiratory: Diminished breath sounds bilaterally with bilateral expiratory wheezing and  rhonchi. She does not appear to be  utilizing respiratory muscles during my evaluation Abdomen: soft, ntnd Skin: no rash or induration seen on limited exam Musculoskeletal: grossly normal tone BUE/BLE Psychiatric: grossly normal mood and affect, speech fluent and appropriate Neurologic: grossly non-focal.          Labs on Admission:  Basic Metabolic Panel:  Recent Labs Lab 10/20/13 1537  NA 137  K 4.0  CL 104  CO2 23  GLUCOSE 112*  BUN 11  CREATININE 0.51  CALCIUM 9.0   Liver Function Tests: No results found for this basename: AST, ALT, ALKPHOS, BILITOT, PROT, ALBUMIN,  in the last 168 hours No results found for this basename: LIPASE, AMYLASE,  in the last 168 hours No results found for this basename: AMMONIA,  in the last 168 hours CBC:  Recent Labs Lab 10/20/13 1537  WBC 6.7  HGB 13.0  HCT 37.6  MCV 84.1  PLT 249   Cardiac Enzymes: No results found for this basename: CKTOTAL, CKMB, CKMBINDEX, TROPONINI,  in the last 168 hours  BNP (last 3 results) No results found for this basename: PROBNP,  in the last 8760 hours CBG: No results found for this basename: GLUCAP,  in the last 168 hours  Radiological Exams on Admission: Dg Chest 2 View  10/20/2013   CLINICAL DATA:  Shortness of breath, history of lung malignancy on the right status post surgery, ongoing tobacco use  EXAM: CHEST  2 VIEW  COMPARISON:  CT ANGIO CHEST W/CM &/OR WO/CM dated 10/05/2013; DG CHEST 1V PORT dated 07/05/2013; DG CHEST 2 VIEW dated 10/05/2013  FINDINGS: The left lung is mildly hyperinflated and there is mild shift of the mediastinum from left to right which is stable. Stable soft tissue density in the right hilar region is present. There are surgical clips projecting in the right upper hemi thorax. The cardiac silhouette and pulmonary vascularity are normal. There is no pleural effusion.  IMPRESSION: There are postsurgical changes on the right. There are stable findings of COPD bilaterally. There is no  evidence of pneumonia nor CHF or other acute cardiopulmonary disease.   Electronically Signed   By: David  Martinique   On: 10/20/2013 16:41    EKG: Independently reviewed. Normal sinus rhythm  Assessment/Plan Principal Problem:   COPD exacerbation Active Problems:   Pulmonary mass   BREAST MASS, LEFT   Post-thoracotomy pain syndrome   HIV INFECTION   COPD (chronic obstructive pulmonary disease)   1. Chronic obstructive pulmonary disease exacerbation.  Patient presenting with complaints of increasing shortness of breath, cough, wheezing over the past week. She was recently prescribed a Z-Pak by her pulmonologist. Continues to have cough, dyspnea, and wheezing; will treat with Solu-Medrol 40 mg IV every 6 hours, scheduled duo nebs, inhaled steroid. Patient completing a 5 day course of azithromycin, with chest x-ray negative for infiltrate and lab work showing normal white count. She is afebrile, will hold off on antimicrobial therapy.   2.   History of lung cancer.  Patient undergoing right video-assisted thoracoscopy with right resection of right upper lobe mass  performed on 07/05/2013, pathology reporting invasive adenocarcinoma, with negative lymph nodes. Repeat CT scan performed on 10/05/2013 not showing recurrence of malignancy  3.   HIV positive.  Currently follows Dr. Linus Salmons of infectious disease. Viral came down from 46,199 on 04/01/2013 to 49 on 06/17/2013 as she was started on Stribild during this time. She reports tolerating meds. No active issues, will continue current regimen.  4.  Tobacco abuse.  Unfortunately patient continues to  smoke. She was extensively counseled on tobacco cessation in the emergency room  5.  History of seizure disorder.  She reports having a seizure several days ago at home. Will continue her Depakote therapy monitor closely during this hospitalization.    Code Status: Full Code Family Communication: Spoke with her sister at bedside Disposition Plan:  Anticipate she will require at least 2 nights hospitalization  Time spent: 70 min  [facility] Pager [phone]  **Disclaimer: This note may have been dictated with voice recognition software. Similar sounding words can inadvertently be transcribed and this note may contain transcription errors which may not have been corrected upon publication of note.**

## 2013-10-21 DIAGNOSIS — F3289 Other specified depressive episodes: Secondary | ICD-10-CM

## 2013-10-21 DIAGNOSIS — F329 Major depressive disorder, single episode, unspecified: Secondary | ICD-10-CM

## 2013-10-21 LAB — BASIC METABOLIC PANEL
BUN: 7 mg/dL (ref 6–23)
CHLORIDE: 107 meq/L (ref 96–112)
CO2: 15 mEq/L — ABNORMAL LOW (ref 19–32)
Calcium: 9.5 mg/dL (ref 8.4–10.5)
Creatinine, Ser: 0.52 mg/dL (ref 0.50–1.10)
Glucose, Bld: 180 mg/dL — ABNORMAL HIGH (ref 70–99)
POTASSIUM: 4.6 meq/L (ref 3.7–5.3)
Sodium: 141 mEq/L (ref 137–147)

## 2013-10-21 LAB — CBC
HEMATOCRIT: 37.5 % (ref 36.0–46.0)
HEMOGLOBIN: 12.6 g/dL (ref 12.0–15.0)
MCH: 28.6 pg (ref 26.0–34.0)
MCHC: 33.6 g/dL (ref 30.0–36.0)
MCV: 85 fL (ref 78.0–100.0)
Platelets: 258 10*3/uL (ref 150–400)
RBC: 4.41 MIL/uL (ref 3.87–5.11)
RDW: 14.1 % (ref 11.5–15.5)
WBC: 5 10*3/uL (ref 4.0–10.5)

## 2013-10-21 MED ORDER — BUDESONIDE 0.25 MG/2ML IN SUSP
0.2500 mg | Freq: Two times a day (BID) | RESPIRATORY_TRACT | Status: DC
Start: 2013-10-21 — End: 2013-10-23
  Administered 2013-10-21 – 2013-10-23 (×5): 0.25 mg via RESPIRATORY_TRACT
  Filled 2013-10-21 (×7): qty 2

## 2013-10-21 MED ORDER — NICOTINE 14 MG/24HR TD PT24
14.0000 mg | MEDICATED_PATCH | Freq: Every day | TRANSDERMAL | Status: DC
  Administered 2013-10-21 – 2013-10-22 (×2): 14 mg via TRANSDERMAL
  Filled 2013-10-21 (×3): qty 1

## 2013-10-21 MED ORDER — GUAIFENESIN ER 600 MG PO TB12
600.0000 mg | ORAL_TABLET | Freq: Two times a day (BID) | ORAL | Status: DC
  Administered 2013-10-21 – 2013-10-23 (×5): 600 mg via ORAL
  Filled 2013-10-21 (×6): qty 1

## 2013-10-21 MED ORDER — DOXYCYCLINE HYCLATE 100 MG PO TABS
100.0000 mg | ORAL_TABLET | Freq: Two times a day (BID) | ORAL | Status: DC
  Administered 2013-10-21 – 2013-10-23 (×5): 100 mg via ORAL
  Filled 2013-10-21 (×6): qty 1

## 2013-10-21 NOTE — Care Management Note (Addendum)
  Page 1 of 1   10/21/2013     12:03:05 PM CARE MANAGEMENT NOTE 10/21/2013  Patient:  [facility]   Account Number:  [id number]  Date Initiated:  10/21/2013  Documentation initiated by:  Mariann Laster  Subjective/Objective Assessment:   Admitted with COPD Exacerbation     Action/Plan:   CM to follow for dispositon needs   Anticipated DC Date:  10/24/2013   Anticipated DC Plan:  HOME/SELF CARE         Choice offered to / List presented to:             Status of service:  In process, will continue to follow Medicare Important Message given?   (If response is "NO", the following Medicare IM given date fields will be blank) Date Medicare IM given:   Date Additional Medicare IM given:    Discharge Disposition:    Per UR Regulation:  Reviewed for med. necessity/level of care/duration of stay  If discussed at Long Length of Stay Meetings, dates discussed:    Comments:  Areen Trautner RN, BSN, MSHL, CCM  Nurse - Case Manager, ([facility])  [phone]  10/21/2013 IM:  n/a Admitted with COPD Exacerbation, Hx/o 51+, Poly substance and ETOH abuse, Righ Lobectomy d/t lung CA 07/2013, still smoking Social:  From home with family MCD Pending status Disposition Plan:  Pending

## 2013-10-21 NOTE — Progress Notes (Signed)
UR completed Giannina Bartolome K. Kaynen Minner, RN, BSN, [place], CCM  10/21/2013 12:05 PM

## 2013-10-21 NOTE — Progress Notes (Signed)
[facility] PROGRESS NOTE  Elizabeth [name] BTD:[id number] DOB: 1970/09/05 DOA: 10/20/2013 PCP: No PCP Per Patient  Assessment/Plan: 1. Acute resp failure due to Chronic obstructive pulmonary disease exacerbation.  -will continue solumedrol, start pulmicort and continue scheduled/PRN nebulizer treatments -will start flutter valve, mucinex and doxycycline as she felt chills overnight and is warm on exam. -continue PRN oxygen supplementation  2. History of lung cancer.  Patient undergoing right video-assisted thoracoscopy with right resection of right upper lobe mass performed on 07/05/2013, pathology reporting invasive adenocarcinoma, with negative lymph nodes. Repeat CT scan performed on 10/05/2013 not showing recurrence of malignancy  -advise to quit smoking  3. HIV positive.  Currently follows Dr. Linus Salmons of infectious disease. Viral came down from 46,199 on 04/01/2013 to 49 on 06/17/2013 as she was started on Stribild during this time. She reports compliance with medications. -patient has not had any further follow up with ID -will check Cd4 and viral load  -continue current regimen   4. Tobacco abuse.  Smoking cessation counseling has been provided started on nicotine patch  5. History of seizure disorder.  Continue depakote; no seizure activity appreciated     Code Status: Full Family Communication: no family at bedside Disposition Plan: home when medically stable   Consultants:  None   Procedures:  See below for x-ray reports   Antibiotics:  Doxycycline   HPI/Subjective: Feeling better, but still complaining of some SOB and diffuse wheezing.  Objective: Filed Vitals:   10/21/13 0602  BP: 118/76  Pulse: 97  Temp: 98.1 F (36.7 C)  Resp: 18    Intake/Output Summary (Last 24 hours) at 10/21/13 0848 Last data filed at 10/20/13 2354  Gross per 24 hour  Intake      0 ml  Output    700 ml  Net   -700 ml   Filed Weights   10/20/13 1455 10/20/13 1839  10/21/13 0602  Weight: 63.05 kg (139 lb) 61.871 kg (136 lb 6.4 oz) 62.143 kg (137 lb)    Exam:   General:  Slightly better; but still with significant wheezing and feeling SOB  Cardiovascular: S1 and S2, no rubs or gallops  Respiratory: diffuse exp wheezing, no crackles  Abdomen: soft, NT, ND, positive BS  Musculoskeletal: no edema, cyanosis or clubbing  Data Reviewed: Basic Metabolic Panel:  Recent Labs Lab 10/20/13 1537 10/21/13 0540  NA 137 141  K 4.0 4.6  CL 104 107  CO2 23 15*  GLUCOSE 112* 180*  BUN 11 7  CREATININE 0.51 0.52  CALCIUM 9.0 9.5   CBC:  Recent Labs Lab 10/20/13 1537 10/21/13 0540  WBC 6.7 5.0  HGB 13.0 12.6  HCT 37.6 37.5  MCV 84.1 85.0  PLT 249 258   Studies: Dg Chest 2 View  10/20/2013   CLINICAL DATA:  Shortness of breath, history of lung malignancy on the right status post surgery, ongoing tobacco use  EXAM: CHEST  2 VIEW  COMPARISON:  CT ANGIO CHEST W/CM &/OR WO/CM dated 10/05/2013; DG CHEST 1V PORT dated 07/05/2013; DG CHEST 2 VIEW dated 10/05/2013  FINDINGS: The left lung is mildly hyperinflated and there is mild shift of the mediastinum from left to right which is stable. Stable soft tissue density in the right hilar region is present. There are surgical clips projecting in the right upper hemi thorax. The cardiac silhouette and pulmonary vascularity are normal. There is no pleural effusion.  IMPRESSION: There are postsurgical changes on the right. There are stable findings of COPD  bilaterally. There is no evidence of pneumonia nor CHF or other acute cardiopulmonary disease.   Electronically Signed   By: David  Martinique   On: 10/20/2013 16:41    Scheduled Meds: . budesonide (PULMICORT) nebulizer solution  0.25 mg Nebulization BID  . divalproex  250 mg Oral Daily  . divalproex  500 mg Oral QHS  . docusate sodium  100 mg Oral BID  . doxycycline  100 mg Oral Q12H  . elvitegravir-cobicistat-emtricitabine-tenofovir  1 tablet Oral Q breakfast  .  enoxaparin (LOVENOX) injection  40 mg Subcutaneous Q24H  . ipratropium-albuterol  3 mL Nebulization Q4H  . methylPREDNISolone (SOLU-MEDROL) injection  40 mg Intravenous Q6H  . nicotine  14 mg Transdermal Daily  . QUEtiapine  200 mg Oral QHS  . sodium chloride  3 mL Intravenous Q12H  . traZODone  75 mg Oral QHS   Continuous Infusions: . sodium chloride 75 mL/hr at 10/20/13 1858    Principal Problem:   COPD exacerbation Active Problems:   HIV INFECTION   BREAST MASS, LEFT   Pulmonary mass   Post-thoracotomy pain syndrome   COPD (chronic obstructive pulmonary disease)    Time spent: >30 minutes    [facility] Pager [phone]. If 7PM-7AM, please contact night-coverage at [email or web address], password [facility] 10/21/2013, 8:48 AM  LOS: 1 day

## 2013-10-22 LAB — BASIC METABOLIC PANEL
BUN: 7 mg/dL (ref 6–23)
CALCIUM: 9.5 mg/dL (ref 8.4–10.5)
CO2: 15 mEq/L — ABNORMAL LOW (ref 19–32)
Chloride: 105 mEq/L (ref 96–112)
Creatinine, Ser: 0.51 mg/dL (ref 0.50–1.10)
GFR calc Af Amer: >90 mL/min (ref >90)
Glucose, Bld: 264 mg/dL — ABNORMAL HIGH (ref 70–99)
Potassium: 4.2 mEq/L (ref 3.7–5.3)
SODIUM: 137 meq/L (ref 137–147)

## 2013-10-22 LAB — CBC
HCT: 36.4 % (ref 36.0–46.0)
Hemoglobin: 12.1 g/dL (ref 12.0–15.0)
MCH: 28.8 pg (ref 26.0–34.0)
MCHC: 33.2 g/dL (ref 30.0–36.0)
MCV: 86.7 fL (ref 78.0–100.0)
Platelets: 292 10*3/uL (ref 150–400)
RBC: 4.2 MIL/uL (ref 3.87–5.11)
RDW: 14.8 % (ref 11.5–15.5)
WBC: 11.7 10*3/uL — ABNORMAL HIGH (ref 4.0–10.5)

## 2013-10-22 LAB — T-HELPER CELLS (CD4) COUNT (NOT AT ARMC)
CD4 T CELL HELPER: 25 % — AB (ref 33–55)
CD4 T Cell Abs: 250 /uL — ABNORMAL LOW (ref 400–2700)

## 2013-10-22 MED ORDER — PREDNISONE 10 MG PO TABS
60.0000 mg | ORAL_TABLET | Freq: Every day | ORAL | Status: DC
  Administered 2013-10-23: 60 mg via ORAL
  Filled 2013-10-22 (×2): qty 1

## 2013-10-22 MED ORDER — OXYCODONE-ACETAMINOPHEN 5-325 MG PO TABS
1.0000 | ORAL_TABLET | ORAL | Status: DC | PRN
  Administered 2013-10-22 – 2013-10-23 (×2): 2 via ORAL
  Filled 2013-10-22 (×2): qty 2

## 2013-10-22 NOTE — Progress Notes (Signed)
[facility] PROGRESS NOTE  Elizabeth [name] QIO:[id number] DOB: September 15, 1970 DOA: 10/20/2013 PCP: No PCP Per Patient  Assessment/Plan: 1. Acute resp failure due to Chronic obstructive pulmonary disease exacerbation.  -will continue solumedrol X1 more day; plan is to switch to prednisone for slow tapering treatment, continue pulmicort and continue scheduled/PRN nebulizer treatments -will continue flutter valve, mucinex and doxycycline  -currently with good O2 sat on RA  2. History of lung cancer.  Patient undergoing right video-assisted thoracoscopy with right resection of right upper lobe mass performed on 07/05/2013, pathology reporting invasive adenocarcinoma, with negative lymph nodes. Repeat CT scan performed on 10/05/2013 not showing recurrence of malignancy  -advise to quit smoking  3. HIV positive.  Currently follows Dr. Linus Salmons of infectious disease. Viral came down from 46,199 on 04/01/2013 to 49 on 06/17/2013 as she was started on Stribild during this time. She reports compliance with medications. -patient has not had any further follow up with ID -CD4 count 250; viral load pending  -continue current regimen   4. Tobacco abuse.  Smoking cessation counseling has been provided started on nicotine patch  5. History of seizure disorder.  Continue depakote; no seizure activity appreciated     Code Status: Full Family Communication: no family at bedside Disposition Plan: home when medically stable   Consultants:  None   Procedures:  See below for x-ray reports   Antibiotics:  Doxycycline   HPI/Subjective: Feeling better, but still complaining of SOB with exertion mainly; and some exp wheezing  Objective: Filed Vitals:   10/22/13 1328  BP: 121/66  Pulse: 113  Temp: 98.2 F (36.8 C)  Resp: 18    Intake/Output Summary (Last 24 hours) at 10/22/13 1632 Last data filed at 10/22/13 1300  Gross per 24 hour  Intake   1850 ml  Output   2200 ml  Net   -350 ml    Filed Weights   10/20/13 1839 10/21/13 0602 10/22/13 0557  Weight: 61.871 kg (136 lb 6.4 oz) 62.143 kg (137 lb) 59.92 kg (132 lb 1.6 oz)    Exam:   General:  Continue to improve; almost able to speak in full sentences but very wean off with minimal exertion; no fever  Cardiovascular: S1 and S2, no rubs or gallops  Respiratory: positive exp wheezing, no crackles; positive rhonchi; overall improved air movement  Abdomen: soft, NT, ND, positive BS  Musculoskeletal: no edema, cyanosis or clubbing  Data Reviewed: Basic Metabolic Panel:  Recent Labs Lab 10/20/13 1537 10/21/13 0540 10/22/13 0713  NA 137 141 137  K 4.0 4.6 4.2  CL 104 107 105  CO2 23 15* 15*  GLUCOSE 112* 180* 264*  BUN 11 7 7   CREATININE 0.51 0.52 0.51  CALCIUM 9.0 9.5 9.5   CBC:  Recent Labs Lab 10/20/13 1537 10/21/13 0540 10/22/13 0713  WBC 6.7 5.0 11.7*  HGB 13.0 12.6 12.1  HCT 37.6 37.5 36.4  MCV 84.1 85.0 86.7  PLT 249 258 292   Studies: Dg Chest 2 View  10/20/2013   CLINICAL DATA:  Shortness of breath, history of lung malignancy on the right status post surgery, ongoing tobacco use  EXAM: CHEST  2 VIEW  COMPARISON:  CT ANGIO CHEST W/CM &/OR WO/CM dated 10/05/2013; DG CHEST 1V PORT dated 07/05/2013; DG CHEST 2 VIEW dated 10/05/2013  FINDINGS: The left lung is mildly hyperinflated and there is mild shift of the mediastinum from left to right which is stable. Stable soft tissue density in the right hilar region is  present. There are surgical clips projecting in the right upper hemi thorax. The cardiac silhouette and pulmonary vascularity are normal. There is no pleural effusion.  IMPRESSION: There are postsurgical changes on the right. There are stable findings of COPD bilaterally. There is no evidence of pneumonia nor CHF or other acute cardiopulmonary disease.   Electronically Signed   By: David  Martinique   On: 10/20/2013 16:41    Scheduled Meds: . budesonide (PULMICORT) nebulizer solution  0.25 mg  Nebulization BID  . divalproex  250 mg Oral Daily  . divalproex  500 mg Oral QHS  . docusate sodium  100 mg Oral BID  . doxycycline  100 mg Oral Q12H  . elvitegravir-cobicistat-emtricitabine-tenofovir  1 tablet Oral Q breakfast  . enoxaparin (LOVENOX) injection  40 mg Subcutaneous Q24H  . guaiFENesin  600 mg Oral BID  . ipratropium-albuterol  3 mL Nebulization Q4H  . methylPREDNISolone (SOLU-MEDROL) injection  40 mg Intravenous Q6H  . nicotine  14 mg Transdermal Daily  . [START ON 10/23/2013] predniSONE  60 mg Oral Q breakfast  . QUEtiapine  200 mg Oral QHS  . sodium chloride  3 mL Intravenous Q12H  . traZODone  75 mg Oral QHS   Continuous Infusions: . sodium chloride 50 mL/hr at 10/22/13 1244    Principal Problem:   COPD exacerbation Active Problems:   HIV INFECTION   BREAST MASS, LEFT   Pulmonary mass   Post-thoracotomy pain syndrome   COPD (chronic obstructive pulmonary disease)    Time spent: >30 minutes    [facility] Pager [phone]. If 7PM-7AM, please contact night-coverage at [email or web address], password [facility] 10/22/2013, 4:32 PM  LOS: 2 days

## 2013-10-22 NOTE — Progress Notes (Signed)
Pt alert and oriented. Ambulates in room. Has some wheezing which gets better with breathing treatments. Medicated once with prn morphine for incisional pain of an old wound.

## 2013-10-22 NOTE — Progress Notes (Signed)
10/22/13- Patient ambulated yesterday 10/21/13 in the hallway while off oxygen. O2 saturations remained between 97% and 98%. Catha Gosselin RN

## 2013-10-23 DIAGNOSIS — J9600 Acute respiratory failure, unspecified whether with hypoxia or hypercapnia: Secondary | ICD-10-CM

## 2013-10-23 MED ORDER — PANTOPRAZOLE SODIUM 40 MG PO TBEC: 40.0000 mg | DELAYED_RELEASE_TABLET | Freq: Every day | ORAL | Status: DC

## 2013-10-23 MED ORDER — PREDNISONE 20 MG PO TABS
ORAL_TABLET | ORAL | Status: DC
Start: 2013-10-23 — End: 2013-11-09

## 2013-10-23 MED ORDER — OXYCODONE-ACETAMINOPHEN 5-325 MG PO TABS: 1.0000 | ORAL_TABLET | Freq: Four times a day (QID) | ORAL | Status: DC | PRN

## 2013-10-23 MED ORDER — DOXYCYCLINE HYCLATE 100 MG PO TABS: 100.0000 mg | ORAL_TABLET | Freq: Two times a day (BID) | ORAL | Status: AC

## 2013-10-23 MED ORDER — BUDESONIDE-FORMOTEROL FUMARATE 160-4.5 MCG/ACT IN AERO: 2.0000 | INHALATION_SPRAY | Freq: Two times a day (BID) | RESPIRATORY_TRACT | Status: DC

## 2013-10-23 MED ORDER — IPRATROPIUM-ALBUTEROL 20-100 MCG/ACT IN AERS: 1.0000 | INHALATION_SPRAY | Freq: Four times a day (QID) | RESPIRATORY_TRACT | Status: DC | PRN

## 2013-10-23 MED ORDER — GUAIFENESIN ER 600 MG PO TB12: 600.0000 mg | ORAL_TABLET | Freq: Two times a day (BID) | ORAL | Status: DC

## 2013-10-23 NOTE — Discharge Summary (Signed)
Physician Discharge Summary  Elizabeth [name] VPX:[id number] DOB: 16-Oct-1970 DOA: 10/20/2013  PCP: No PCP Per Patient  Admit date: 10/20/2013 Discharge date: 10/23/2013  Time spent: >30 minutes  Recommendations for Outpatient Follow-up:  1. Check BMET to follow electrolytes and renal function 2. Follow medication compliance and assist on smoking cessation process  Discharge Diagnoses:  Acute resp failure COPD exacerbation HIV INFECTION BREAST MASS, LEFT Pulmonary mass s/p resection Post-thoracotomy pain syndrome Depression Tobacco abuse GERD    Discharge Condition: stable and improved. Advise to stop smoking and to follow with pulmonary service and ID specialist for further treatment of her COPD and HIV.  Diet recommendation: regular diet  Filed Weights   10/21/13 0602 10/22/13 0557 10/23/13 0634  Weight: 62.143 kg (137 lb) 59.92 kg (132 lb 1.6 oz) 64.093 kg (141 lb 4.8 oz)    History of present illness:  43 y.o. female with a past medical history of 40-pack-year history of tobacco abuse, T2 N0 adenocarcinoma, status post right video-assisted thoracoscopy with right resection of right upper lobe mass performed on 07/05/2013, pathology reporting invasive adenocarcinoma, HIV positive, COPD presenting to the emergency room with complaints of cough, shortness of breath, wheezing progressively worse over the past week. Patient was seen by her pulmonologist Dr. Melvyn Novas on 10/08/2013 at which time she was prescribed Z-Pak. She recently had a CT scan of lungs performed on 10/05/2013 which did not show recurrent or residual malignancy. on today's visit she had a chest x-ray which showed stable findings of COPD bilaterally without evidence of pneumonia or CHF. She complains of ongoing thoracic wall pain to right side as well as having several episodes of nausea and vomiting yesterday. She denies fevers, chills, diarrhea, abdominal pain, hematuria, dysuria. Patient also reports having seizure activity  several days ago while at home. There is compliance to all of her medications.    Hospital Course:  1. Acute resp failure due to Chronic obstructive pulmonary disease exacerbation.  -will discharge on prednisone slow tapering regimen, PRN combivent and symbicort BID -will continue flutter valve, mucinex and doxycycline  -currently with good O2 sat on RA   2. History of lung cancer.  Patient had right video-assisted thoracoscopy with right resection of right upper lobe mass performed on 07/05/2013, pathology reporting invasive adenocarcinoma, with negative lymph nodes. Repeat CT scan performed on 10/05/2013 not showing recurrence of malignancy  -advise to quit smoking  -will continue follow up with pulmonologist  3. HIV positive.  Currently follows Dr. Linus Salmons of infectious disease. Viral came down from 46,199 on 04/01/2013 to 49 on 06/17/2013 as she was started on Stribild during this time. She reports compliance with medications.  -patient has not had any further follow up with ID  -CD4 count 250; viral load pending at discharge -continue current regimen and advise to set up visit with ID clinic after discharge  4. Tobacco abuse.  -Smoking cessation counseling has been provided. -contemplating quitting -encourage to use nicotine patch to help in quitting process   5. History of seizure disorder.  -Continue depakote; no seizure activity appreciated   6.depression: continue trazadone and seroquel  7. Chronic pain syndrome: continue PRN pain medications.  8.GERD: started on PPI    Procedures: See below for x-ray reports  Consultations:  None   Discharge Exam: Filed Vitals:   10/23/13 0900  BP: 125/74  Pulse: 105  Temp: 98.7 F (37.1 C)  Resp: 18   General: feeling great; just slight exp wheezing; able to speak in full sentences and  good O2 sat on RA Cardiovascular: S1 and S2, no rubs or gallops  Respiratory: slight end exp wheezing, no crackles; scattered rhonchi;  significant improvement in her air movement  Abdomen: soft, NT, ND, positive BS  Musculoskeletal: no edema, cyanosis or clubbing    Discharge Instructions You were cared for by a hospitalist during your hospital stay. If you have any questions about your discharge medications or the care you received while you were in the hospital after you are discharged, you can call the unit and asked to speak with the hospitalist on call if the hospitalist that took care of you is not available. Once you are discharged, your primary care physician will handle any further medical issues. Please note that NO REFILLS for any discharge medications will be authorized once you are discharged, as it is imperative that you return to your primary care physician (or establish a relationship with a primary care physician if you do not have one) for your aftercare needs so that they can reassess your need for medications and monitor your lab values.  Discharge Instructions   Discharge instructions    Complete by:  As directed   Take medications as prescribed Follow with PCP in 10 days Arrange follow with Dr. Linus Salmons over the next 2 weeks (call office to set up appointment) Stop smoking Maintain yourself well hydrated            Medication List    STOP taking these medications       albuterol 108 (90 BASE) MCG/ACT inhaler  Commonly known as:  PROVENTIL HFA;VENTOLIN HFA      TAKE these medications       budesonide-formoterol 160-4.5 MCG/ACT inhaler  Commonly known as:  SYMBICORT  Inhale 2 puffs into the lungs 2 (two) times daily.     divalproex 250 MG DR tablet  Commonly known as:  DEPAKOTE  Take 250-500 mg by mouth 2 (two) times daily. 1 in am, 2 in pm     doxycycline 100 MG tablet  Commonly known as:  VIBRA-TABS  Take 1 tablet (100 mg total) by mouth every 12 (twelve) hours.     elvitegravir-cobicistat-emtricitabine-tenofovir 150-150-200-300 MG Tabs tablet  Commonly known as:  STRIBILD  Take 1  tablet by mouth daily with breakfast.     guaiFENesin 600 MG 12 hr tablet  Commonly known as:  MUCINEX  Take 1 tablet (600 mg total) by mouth 2 (two) times daily.     Ipratropium-Albuterol 20-100 MCG/ACT Aers respimat  Commonly known as:  COMBIVENT  Inhale 1 puff into the lungs every 6 (six) hours as needed for wheezing or shortness of breath.     oxyCODONE-acetaminophen 5-325 MG per tablet  Commonly known as:  ROXICET  Take 1 tablet by mouth every 6 (six) hours as needed for severe pain.     pantoprazole 40 MG tablet  Commonly known as:  PROTONIX  Take 1 tablet (40 mg total) by mouth daily.     predniSONE 20 MG tablet  Commonly known as:  DELTASONE  Take 3 tablets by mouth X 3 days; then 2 tablets by mouth X 3 days; then 1 tablet by mouth X 3 days; then 1/2 tablet by mouth X 3 days and stop prednisone     QUEtiapine 200 MG tablet  Commonly known as:  SEROQUEL  Take 200 mg by mouth at bedtime.     traZODone 150 MG tablet  Commonly known as:  DESYREL  Take 75 mg by mouth  at bedtime.       Allergies  Allergen Reactions  . Gabapentin Swelling       Follow-up Information   Follow up with Scharlene Gloss, MD. (call office to set up appointment)    Specialty:  Infectious Diseases   Contact information:   [address] [place] [address] [phone]       Follow up with Christinia Gully, MD In 10 days. (call office to set up appointment)    Specialty:  Pulmonary Disease   Contact information:   [address] [place] [address] [phone]        The results of significant diagnostics from this hospitalization (including imaging, microbiology, ancillary and laboratory) are listed below for reference.    Significant Diagnostic Studies: Dg Chest 2 View  10/20/2013   CLINICAL DATA:  Shortness of breath, history of lung malignancy on the right status post surgery, ongoing tobacco use  EXAM: CHEST  2 VIEW  COMPARISON:  CT ANGIO CHEST W/CM &/OR WO/CM  dated 10/05/2013; DG CHEST 1V PORT dated 07/05/2013; DG CHEST 2 VIEW dated 10/05/2013  FINDINGS: The left lung is mildly hyperinflated and there is mild shift of the mediastinum from left to right which is stable. Stable soft tissue density in the right hilar region is present. There are surgical clips projecting in the right upper hemi thorax. The cardiac silhouette and pulmonary vascularity are normal. There is no pleural effusion.  IMPRESSION: There are postsurgical changes on the right. There are stable findings of COPD bilaterally. There is no evidence of pneumonia nor CHF or other acute cardiopulmonary disease.   Electronically Signed   By: David  Martinique   On: 10/20/2013 16:41   Dg Chest 2 View  10/05/2013   CLINICAL DATA:  Shortness of breath, cough, lung cancer  EXAM: CHEST  2 VIEW  COMPARISON:  08/03/2013  FINDINGS: Cardiomediastinal silhouette is stable. Stable postoperative changes right hemithorax. Surgical clips in right axilla again noted. No acute infiltrate or pulmonary edema. Bony thorax is stable.  IMPRESSION: No acute infiltrate or pulmonary edema. Stable volume loss and postoperative changes right hemithorax.   Electronically Signed   By: Lahoma Crocker M.D.   On: 10/05/2013 21:35   Ct Angio Chest Pe W/cm &/or Wo Cm  10/05/2013   CLINICAL DATA:  Shortness of breath, hemoptysis  EXAM: CT ANGIOGRAPHY CHEST WITH CONTRAST  TECHNIQUE: Multidetector CT imaging of the chest was performed using the standard protocol during bolus administration of intravenous contrast. Multiplanar CT image reconstructions and MIPs were obtained to evaluate the vascular anatomy.  CONTRAST:  1102m OMNIPAQUE IOHEXOL 350 MG/ML SOLN  COMPARISON:  DG CHEST 2 VIEW dated 10/05/2013; DG CHEST 2 VIEW dated 08/03/2013; CT CHEST W/CM dated 05/11/2013; DG CHEST 2 VIEW dated 03/29/2013  FINDINGS: There is adequate opacification of the pulmonary arteries. There is no pulmonary embolus. The main pulmonary artery, right main pulmonary artery and  left main pulmonary arteries are normal in size. The heart size is normal. There is no pericardial effusion.  There is evidence of prior right upper lobectomy. There are postsurgical changes in the right lung with right lung volume loss. A  There is no axillary, hilar, or mediastinal adenopathy.  There is no lytic or blastic osseous lesion.  The visualized portions of the upper abdomen are unremarkable.  Review of the MIP images confirms the above findings.  IMPRESSION: 1. No evidence of pulmonary embolus. 2. Postsurgical changes involving the right lung with right lung  volume loss. No recurrent or residual malignancy.   Electronically Signed   By: Kathreen Devoid   On: 10/05/2013 22:52   Labs: Basic Metabolic Panel:  Recent Labs Lab 10/20/13 1537 10/21/13 0540 10/22/13 0713  NA 137 141 137  K 4.0 4.6 4.2  CL 104 107 105  CO2 23 15* 15*  GLUCOSE 112* 180* 264*  BUN 11 7 7   CREATININE 0.51 0.52 0.51  CALCIUM 9.0 9.5 9.5   CBC:  Recent Labs Lab 10/20/13 1537 10/21/13 0540 10/22/13 0713  WBC 6.7 5.0 11.7*  HGB 13.0 12.6 12.1  HCT 37.6 37.5 36.4  MCV 84.1 85.0 86.7  PLT 249 258 292   Signed:  Barton Dubois  [facility] 10/23/2013, 1:42 PM

## 2013-10-23 NOTE — Plan of Care (Signed)
Problem: ICU Phase Progression Outcomes Goal: Hemodynamically stable Outcome: Progressing Afebrile.  Vital signs stable

## 2013-10-23 NOTE — Progress Notes (Signed)
Released.  Verbalized understanding of discharge instructions.

## 2013-10-28 ENCOUNTER — Telehealth: Payer: Self-pay | Admitting: Internal Medicine

## 2013-10-28 ENCOUNTER — Emergency Department (HOSPITAL_COMMUNITY)
Admission: EM | Admit: 2013-10-28 | Discharge: 2013-10-28 | Disposition: A | Discharge status: Home / Self Care | Payer: Medicaid Other | Attending: Emergency Medicine | Admitting: Emergency Medicine

## 2013-10-28 ENCOUNTER — Emergency Department (HOSPITAL_COMMUNITY): Payer: Medicaid Other

## 2013-10-28 ENCOUNTER — Encounter (HOSPITAL_COMMUNITY): Payer: Self-pay | Admitting: Emergency Medicine

## 2013-10-28 DIAGNOSIS — F329 Major depressive disorder, single episode, unspecified: Secondary | ICD-10-CM | POA: Diagnosis not present

## 2013-10-28 DIAGNOSIS — R0602 Shortness of breath: Secondary | ICD-10-CM | POA: Diagnosis present

## 2013-10-28 DIAGNOSIS — Z8679 Personal history of other diseases of the circulatory system: Secondary | ICD-10-CM | POA: Diagnosis not present

## 2013-10-28 DIAGNOSIS — IMO0002 Reserved for concepts with insufficient information to code with codable children: Secondary | ICD-10-CM | POA: Insufficient documentation

## 2013-10-28 DIAGNOSIS — F17200 Nicotine dependence, unspecified, uncomplicated: Secondary | ICD-10-CM | POA: Diagnosis not present

## 2013-10-28 DIAGNOSIS — Z21 Asymptomatic human immunodeficiency virus [HIV] infection status: Secondary | ICD-10-CM | POA: Insufficient documentation

## 2013-10-28 DIAGNOSIS — F3289 Other specified depressive episodes: Secondary | ICD-10-CM | POA: Insufficient documentation

## 2013-10-28 DIAGNOSIS — Z85118 Personal history of other malignant neoplasm of bronchus and lung: Secondary | ICD-10-CM | POA: Diagnosis not present

## 2013-10-28 DIAGNOSIS — R569 Unspecified convulsions: Secondary | ICD-10-CM | POA: Diagnosis not present

## 2013-10-28 DIAGNOSIS — J441 Chronic obstructive pulmonary disease with (acute) exacerbation: Secondary | ICD-10-CM | POA: Insufficient documentation

## 2013-10-28 DIAGNOSIS — J45909 Unspecified asthma, uncomplicated: Secondary | ICD-10-CM | POA: Diagnosis not present

## 2013-10-28 DIAGNOSIS — Z79899 Other long term (current) drug therapy: Secondary | ICD-10-CM | POA: Diagnosis not present

## 2013-10-28 LAB — CBC WITH DIFFERENTIAL/PLATELET
BASOS ABS: 0 10*3/uL (ref 0.0–0.1)
Basophils Relative: 0 % (ref 0–1)
EOS PCT: 0 % (ref 0–5)
Eosinophils Absolute: 0 10*3/uL (ref 0.0–0.7)
HCT: 39.4 % (ref 36.0–46.0)
Hemoglobin: 13.8 g/dL (ref 12.0–15.0)
LYMPHS PCT: 13 % (ref 12–46)
Lymphs Abs: 1.4 10*3/uL (ref 0.7–4.0)
MCH: 29.1 pg (ref 26.0–34.0)
MCHC: 35 g/dL (ref 30.0–36.0)
MCV: 83.1 fL (ref 78.0–100.0)
Monocytes Absolute: 0.7 10*3/uL (ref 0.1–1.0)
Monocytes Relative: 6 % (ref 3–12)
NEUTROS ABS: 8.5 10*3/uL — AB (ref 1.7–7.7)
Neutrophils Relative %: 81 % — ABNORMAL HIGH (ref 43–77)
PLATELETS: 422 10*3/uL — AB (ref 150–400)
RBC: 4.74 MIL/uL (ref 3.87–5.11)
RDW: 13.3 % (ref 11.5–15.5)
WBC: 10.6 10*3/uL — AB (ref 4.0–10.5)

## 2013-10-28 LAB — COMPREHENSIVE METABOLIC PANEL
ALK PHOS: 68 U/L (ref 39–117)
ALT: 9 U/L (ref 0–35)
AST: 9 U/L (ref 0–37)
Albumin: 3.5 g/dL (ref 3.5–5.2)
BUN: 12 mg/dL (ref 6–23)
CALCIUM: 9.5 mg/dL (ref 8.4–10.5)
CHLORIDE: 99 meq/L (ref 96–112)
CO2: 24 meq/L (ref 19–32)
Creatinine, Ser: 0.44 mg/dL — ABNORMAL LOW (ref 0.50–1.10)
GFR calc Af Amer: >90 mL/min (ref >90)
GFR calc non Af Amer: >90 mL/min (ref >90)
Glucose, Bld: 191 mg/dL — ABNORMAL HIGH (ref 70–99)
POTASSIUM: 3.9 meq/L (ref 3.7–5.3)
SODIUM: 137 meq/L (ref 137–147)
Total Bilirubin: 0.2 mg/dL — ABNORMAL LOW (ref 0.3–1.2)
Total Protein: 7.2 g/dL (ref 6.0–8.3)

## 2013-10-28 LAB — I-STAT TROPONIN, ED: Troponin i, poc: 0 ng/mL (ref 0.00–0.08)

## 2013-10-28 LAB — HIV-1 RNA, QUALITATIVE, TMA: HIV-1 RNA, QUAL: DETECTED — AB

## 2013-10-28 MED ORDER — ALBUTEROL SULFATE (2.5 MG/3ML) 0.083% IN NEBU: 5.0000 mg | INHALATION_SOLUTION | Freq: Four times a day (QID) | RESPIRATORY_TRACT | Status: AC | PRN

## 2013-10-28 MED ORDER — IPRATROPIUM BROMIDE 0.02 % IN SOLN
0.5000 mg | Freq: Once | RESPIRATORY_TRACT | Status: AC
  Administered 2013-10-28: 0.5 mg via RESPIRATORY_TRACT
  Filled 2013-10-28: qty 2.5

## 2013-10-28 MED ORDER — ALBUTEROL (5 MG/ML) CONTINUOUS INHALATION SOLN
10.0000 mg/h | INHALATION_SOLUTION | Freq: Once | RESPIRATORY_TRACT | Status: AC
  Administered 2013-10-28: 10 mg/h via RESPIRATORY_TRACT
  Filled 2013-10-28: qty 20

## 2013-10-28 MED ORDER — METHYLPREDNISOLONE SODIUM SUCC 125 MG IJ SOLR
125.0000 mg | Freq: Once | INTRAMUSCULAR | Status: AC
  Administered 2013-10-28: 125 mg via INTRAVENOUS
  Filled 2013-10-28: qty 2

## 2013-10-28 NOTE — Telephone Encounter (Signed)
LMOM x 1 to return call

## 2013-10-28 NOTE — ED Provider Notes (Signed)
CSN: [id number]     Arrival date & time 10/28/13  1217 History   First MD Initiated Contact with Patient 10/28/13 1302     Chief Complaint  Patient presents with  . Shortness of Breath     (Consider location/radiation/quality/duration/timing/severity/associated sxs/prior Treatment) HPI Elizabeth [name] is a 43 y.o. female who presents to emergency department complaining of cough and shortness of breath. Patient with history of lung cancer, right partial lung resection 3 months ago, HIV positive, on antivirals, CD4 count 250 1 week ago. States was recently in the hospital and just discharged 5 days ago. States she was diagnosed with COPD/bronchitis. States currently on doxycyline, prednisone, combivent, symbocort. States was feeling well until yesterday. States was unable to sleep due to cough, SOB, chest tightness. States she is wheezing and no relief with inhaler. Patient denies any fever, complaints of chills and sweats. She denies any blood in her sputum, but states she's coughing up green phlegm. She states she is having some chest tightness, denies pain. Denies any other upper respiratory symptoms. Nothing makes her symptoms better or worse.  Past Medical History  Diagnosis Date  . HIV (human immunodeficiency virus infection)     takes Stribild daily  . Substance abuse   . Asthma     uses Albuterol inhaler daily as needed  . Shortness of breath     sitting/lying/exertion  . Headache(784.0)   . History of migraine     last one about 2wks ago  . Seizures     takes Depakote daily-last one over a week ago  . Dizziness   . Dysphagia   . Urinary frequency   . Nocturia   . Depression     takes Trazodone and Seroquel nightly  . Lung cancer    Past Surgical History  Procedure Laterality Date  . Cesarean section  87yr ago  . Video bronchoscopy Bilateral 05/05/2013    Procedure: VIDEO BRONCHOSCOPY WITH FLUORO;  Surgeon: MTanda Rockers MD;  Location: [facility];  Service:  Cardiopulmonary;  Laterality: Bilateral;  . Video assisted thoracoscopy (vats)/wedge resection Right 07/05/2013    Procedure: VIDEO ASSISTED THORACOSCOPY (VATS)/WEDGE RESECTION, Right Upper Lobectomy with lymph node disecction and OnQ placement;  Surgeon: SMelrose Nakayama MD;  Location: [facility];  Service: Thoracic;  Laterality: Right;  (R)VATS, WEDGE RESECTION, POSSIBLE LOBECTOMY, POSSIBLE CHEST WALL RESECTION   Family History  Problem Relation Age of Onset  . Diabetes Mother   . CAD Mother   . Hypertension Mother   . Hypertension Sister   . Lung cancer Maternal Aunt     smoked  . Allergies Sister   . Asthma Sister   . Lung cancer Maternal Uncle     never smoker  . Lung cancer Maternal Grandmother     never smoker   History  Substance Use Topics  . Smoking status: Current Every Day Smoker -- 0.25 packs/day for 24 years    Types: Cigarettes  . Smokeless tobacco: Never Used  . Alcohol Use: No   OB History   Grav Para Term Preterm Abortions TAB SAB Ect Mult Living                 Review of Systems  Constitutional: Positive for chills and diaphoresis. Negative for fever.  Respiratory: Positive for cough, chest tightness, shortness of breath and wheezing.   Cardiovascular: Negative for chest pain, palpitations and leg swelling.  Gastrointestinal: Negative for nausea, vomiting, abdominal pain and diarrhea.  Genitourinary: Negative for dysuria and  flank pain.  Musculoskeletal: Negative for arthralgias, myalgias, neck pain and neck stiffness.  Skin: Negative for rash.  Neurological: Negative for dizziness, weakness and headaches.  All other systems reviewed and are negative.     Allergies  Gabapentin  Home Medications   Prior to Admission medications   Medication Sig Start Date End Date Taking? Authorizing Provider  budesonide-formoterol (SYMBICORT) 160-4.5 MCG/ACT inhaler Inhale 2 puffs into the lungs 2 (two) times daily. 10/23/13  Yes Barton Dubois, MD  divalproex  (DEPAKOTE) 250 MG DR tablet Take 250-500 mg by mouth 2 (two) times daily. 1 in am, 2 in pm   Yes Historical Provider, MD  doxycycline (VIBRA-TABS) 100 MG tablet Take 1 tablet (100 mg total) by mouth every 12 (twelve) hours. 10/23/13 10/30/13 Yes Barton Dubois, MD  elvitegravir-cobicistat-emtricitabine-tenofovir (STRIBILD) 150-150-200-300 MG TABS tablet Take 1 tablet by mouth daily with breakfast. 05/10/13  Yes Thayer Headings, MD  guaiFENesin (MUCINEX) 600 MG 12 hr tablet Take 1 tablet (600 mg total) by mouth 2 (two) times daily. 10/23/13  Yes Barton Dubois, MD  Ipratropium-Albuterol (COMBIVENT) 20-100 MCG/ACT AERS respimat Inhale 1 puff into the lungs every 6 (six) hours as needed for wheezing or shortness of breath. 10/23/13  Yes Barton Dubois, MD  oxyCODONE-acetaminophen (ROXICET) 5-325 MG per tablet Take 1 tablet by mouth every 6 (six) hours as needed for severe pain. 10/23/13  Yes Barton Dubois, MD  pantoprazole (PROTONIX) 40 MG tablet Take 1 tablet (40 mg total) by mouth daily. 10/23/13  Yes Barton Dubois, MD  predniSONE (DELTASONE) 20 MG tablet Take 3 tablets by mouth X 3 days; then 2 tablets by mouth X 3 days; then 1 tablet by mouth X 3 days; then 1/2 tablet by mouth X 3 days and stop prednisone 10/23/13  Yes Barton Dubois, MD  QUEtiapine (SEROQUEL) 200 MG tablet Take 200 mg by mouth at bedtime.   Yes Historical Provider, MD  traZODone (DESYREL) 150 MG tablet Take 75 mg by mouth at bedtime.   Yes Historical Provider, MD   BP 139/93  Pulse 105  Temp(Src) 98.7 F (37.1 C) (Oral)  Resp 18  SpO2 96%  LMP 10/01/2013 Physical Exam  Nursing note and vitals reviewed. Constitutional: She appears well-developed and well-nourished. No distress.  HENT:  Head: Normocephalic.  Eyes: Conjunctivae are normal.  Neck: Neck supple.  Cardiovascular: Normal rate, regular rhythm and normal heart sounds.   Pulmonary/Chest: Effort normal. No respiratory distress. She has wheezes. She has no rales. She exhibits no  tenderness.  Inspiratory and expiratory wheezes in all lung fields, decreased air movement bilaterally  Abdominal: Soft. Bowel sounds are normal. She exhibits no distension. There is no tenderness. There is no rebound.  Musculoskeletal: She exhibits no edema.  Neurological: She is alert.  Skin: Skin is warm and dry.  Psychiatric: She has a normal mood and affect. Her behavior is normal.    ED Course  Procedures (including critical care time) Labs Review Labs Reviewed  CBC WITH DIFFERENTIAL - Abnormal; Notable for the following:    WBC 10.6 (*)    Platelets 422 (*)    Neutrophils Relative % 81 (*)    Neutro Abs 8.5 (*)    All other components within normal limits  COMPREHENSIVE METABOLIC PANEL - Abnormal; Notable for the following:    Glucose, Bld 191 (*)    Creatinine, Ser 0.44 (*)    Total Bilirubin 0.2 (*)    All other components within normal limits  Randolm Idol, ED  Imaging Review Dg Chest 2 View  10/28/2013   CLINICAL DATA:  Shortness of breath, right lung cancer  EXAM: CHEST  2 VIEW  COMPARISON:  10/20/2013  FINDINGS: Cardiomediastinal silhouette is stable. Postsurgical changes right hemi thorax are stable. No acute infiltrate or pulmonary edema. Bony thorax is unremarkable.  IMPRESSION: No active disease.  Stable postsurgical changes right hemi thorax.   Electronically Signed   By: Lahoma Crocker M.D.   On: 10/28/2013 13:34     EKG Interpretation None      MDM   Final diagnoses:  COPD exacerbation    Patient here with wheezing, cough, chest tightness. Recent admission just discharged 5 days ago for COPD. Patient does have history of HIV infection with CD4 count of 250 just one week ago. She's currently on doxycycline, prednisone, Combi0.0 and I for a the in roomvent, Symbicort. She's taking her antivirals. On exam, patient is in no distress however she is having decreased air movement and bilateral lung fields, with expiratory and inspiratory wheezes. Will start  her on a one-hour long that. Solu-Medrol 125 mg ordered. Will do labs and chest x-ray.  2:53 PM Pt feeling much better. Wheezing resolved. VS normal. Ambulated with pulse ox, remained above 94%. Pt wants to be discharge home. She does have follow up, sees Dr. Melvyn Novas, with pulmonology. Will d/c home.  Pt instructed to return if worsening symptoms. Also will give prescription for neb treatments.   Filed Vitals:   10/28/13 1218 10/28/13 1225 10/28/13 1444 10/28/13 1515  BP:    135/83  Pulse:    109  Temp:      TempSrc:      Resp:  18  18  SpO2: 95% 96% 94% 95%       Renold Genta, PA-C 10/29/13 1546

## 2013-10-28 NOTE — Discharge Instructions (Signed)
Continue all your current medications. Use albuterol neb treatments instead of combivent, do treatments every 4 hrs. Follow up with your pulmonologist as soon as able for recheck. Return if worsening.    Chronic Obstructive Pulmonary Disease Exacerbation Chronic obstructive pulmonary disease (COPD) is a common lung condition in which airflow from the lungs is limited. COPD is a general term that can be used to describe many different lung problems that limit airflow, including chronic bronchitis and emphysema. COPD exacerbations are episodes when breathing symptoms become much worse and require extra treatment. Without treatment, COPD exacerbations can be life threatening, and frequent COPD exacerbations can cause further damage to your lungs. CAUSES   Respiratory infections.   Exposure to smoke.   Exposure to air pollution, chemical fumes, or dust. Sometimes there is no apparent cause or trigger. RISK FACTORS  Smoking cigarettes.  Older age.  Frequent prior COPD exacerbations. SIGNS AND SYMPTOMS   Increased coughing.   Increased thick spit (sputum) production.   Increased wheezing.   Increased shortness of breath.   Rapid breathing.   Chest tightness. DIAGNOSIS  Your medical history, a physical exam, and tests will help your health care provider make a diagnosis. Tests may include:  A chest X-ray.  Basic lab tests.  Sputum testing.  An arterial blood gas test. TREATMENT  Depending on the severity of your COPD exacerbation, you may need to be admitted to a hospital for treatment. Some of the treatments commonly used to treat COPD exacerbations are:   Antibiotic medicines.   Bronchodilators. These are drugs that expand the air passages. They may be given with an inhaler or nebulizer. Spacer devices may be needed to help improve drug delivery.  Corticosteroid medicines.  Supplemental oxygen therapy.  HOME CARE INSTRUCTIONS   Do not smoke. Quitting smoking  is very important to prevent COPD from getting worse and exacerbations from happening as often.  Avoid exposure to all substances that irritate the airway, especially to tobacco smoke.   If prescribed, take your antibiotics as directed. Finish them even if you start to feel better.  Only take over-the-counter or prescription medicines as directed by your health care provider.It is important to use correct technique with inhaled medicines.  Drink enough fluids to keep your urine clear or pale yellow (unless you have a medical condition that requires fluid restriction).  Use a cool mist vaporizer. This makes it easier to clear your chest when you cough.   If you have a home nebulizer and oxygen, continue to use them as directed.   Maintain all necessary vaccinations to prevent infections.   Exercise regularly.   Eat a healthy diet.   Keep all follow-up appointments as directed by your health care provider. SEEK IMMEDIATE MEDICAL CARE IF:  You have worsening shortness of breath.   You have trouble talking.   You have severe chest pain.  You have blood in your sputum.  You have a fever.  You have weakness, vomit repeatedly, or faint.   You feel confused.   You continue to get worse. MAKE SURE YOU:   Understand these instructions.  Will watch your condition.  Will get help right away if you are not doing well or get worse. Document Released: 03/17/2007 Document Revised: 03/10/2013 Document Reviewed: 01/22/2013 [facility] Patient Information 2014 [place].

## 2013-10-28 NOTE — ED Notes (Signed)
Bed: QZ00 Expected date:  Expected time:  Means of arrival:  Comments: ems- cancer, sob

## 2013-10-28 NOTE — ED Notes (Signed)
Per EMS pt recently discharged "with lung infection" and currently taking doxycycline. Pt hx of lung cancer with 45% right lung removed.

## 2013-10-29 NOTE — Telephone Encounter (Signed)
Unable to reach pt at home # - lmomtcb  Unable to reach EC(sister) - unable to leave vmail  Patient needs HFU appt-- seen in ED 10/28/13 Called into our office at 11:35 - unable to reach pt or EC (sister) when called back Arrived at ED 12:17 - no admit

## 2013-11-01 NOTE — Telephone Encounter (Signed)
Pt went to ED on 10-28-13, not admitted. LMTCBx1 to see if pt needs appt. [place], CMA

## 2013-11-02 ENCOUNTER — Ambulatory Visit: Payer: Self-pay | Admitting: Internal Medicine

## 2013-11-02 ENCOUNTER — Telehealth: Payer: Self-pay | Admitting: Surgery

## 2013-11-02 NOTE — Telephone Encounter (Signed)
ED CM contacted patient concerning f/u appt with Pulmonologist. LM for a return call

## 2013-11-02 NOTE — Telephone Encounter (Signed)
Lmtcbx2. Elizabeth [name], CMA  

## 2013-11-02 NOTE — Telephone Encounter (Signed)
Message copied by Laurena Slimmer on Tue Nov 02, 2013  6:24 PM ------      Message from: Jeannett Senior A      Created: Thu Oct 28, 2013  2:59 PM      Regarding: Order for [facility]             Patient Name: CBUL,AGTXMI([id number])      Sex: Female      DOB: 09-Jan-1971              PCP: NO PCP PER PATIENT        Center: [facility]                          Types of orders made on 10/28/2013: Consult, ECG, Imaging, Lab, Medications,                                           Nursing, Point of Care Testing            Order Date:10/28/2013      Ordering Eustaquio Boyden A [place]      Attending Provider:Iva Harmon Dun, MD [id number]      Authorizing Provider: Renold Genta, PA-C [id number]      Department:[facility][[id number]]            Order Specific Information      Order: COPD clinic follow up [Custom: [id number]]  Order #: [id number]  Qty: 1        Priority: Routine  Class: Hospital Performed        Comment:Dr. Melvyn Novas, in 2-3 days          Where does the patient receive follow up COPD care -> Other                 Follow up in -> Other (see comments)               Released on: 10/28/2013  2:59 PM                    Priority: Routine  Class: Hospital Performed        Comment:Dr. Melvyn Novas, in 2-3 days          Where does the patient receive follow up COPD care -> Other                 Follow up in -> Other (see comments)               Released on: 10/28/2013  2:59 PM       ------

## 2013-11-03 ENCOUNTER — Ambulatory Visit (INDEPENDENT_AMBULATORY_CARE_PROVIDER_SITE_OTHER): Payer: Self-pay | Admitting: Internal Medicine

## 2013-11-03 ENCOUNTER — Encounter: Payer: Self-pay | Admitting: Internal Medicine

## 2013-11-03 ENCOUNTER — Telehealth: Payer: Self-pay | Admitting: *Deleted

## 2013-11-03 VITALS — BP 136/87 | HR 87 | Temp 98.4°F | Ht 61.0 in | Wt 136.0 lb

## 2013-11-03 DIAGNOSIS — B20 Human immunodeficiency virus [HIV] disease: Secondary | ICD-10-CM

## 2013-11-03 LAB — COMPLETE METABOLIC PANEL WITH GFR
AST: 7 U/L (ref 0–37)
Albumin: 3.5 g/dL (ref 3.5–5.2)
Alkaline Phosphatase: 54 U/L (ref 39–117)
BUN: 14 mg/dL (ref 6–23)
CALCIUM: 8.7 mg/dL (ref 8.4–10.5)
CHLORIDE: 103 meq/L (ref 96–112)
CO2: 28 meq/L (ref 19–32)
Creat: 0.61 mg/dL (ref 0.50–1.10)
GFR, Est African American: >89 mL/min
GFR, Est Non African American: >89 mL/min
Glucose, Bld: 78 mg/dL (ref 70–99)
Potassium: 3.7 mEq/L (ref 3.5–5.3)
Sodium: 139 mEq/L (ref 135–145)
Total Bilirubin: 0.2 mg/dL (ref 0.2–1.2)
Total Protein: 6 g/dL (ref 6.0–8.3)

## 2013-11-03 NOTE — Progress Notes (Signed)
   Subjective:    Patient ID: Elizabeth [name], female    DOB: 1970-12-30, 43 y.o.   MRN: [id number]  HPI  Here for follow up of HIV and pulmonary mass.  Seen in November after ED visit for her first visit here and was found to have a mass on CXR and CT.  I sent her to pulmonary for evaluation and got a trial of Augmentin but no improvement, then PET concerning for non infectious etiology and non diagnostic bronchscopy.  Had lung biopsy. Diagnosed with adenocarcinoma.  HAs been on Stribild but does endorse some missed doses.     Review of Systems  Constitutional: Negative for fever, chills, fatigue and unexpected weight change.  Respiratory: Positive for cough. Negative for shortness of breath.   Gastrointestinal: Negative for nausea and diarrhea.  Neurological: Negative for dizziness and light-headedness.  Hematological: Negative for adenopathy.  Psychiatric/Behavioral: Negative for dysphoric mood.       Objective:   Physical Exam  Constitutional: She appears well-developed and well-nourished. No distress.  HENT:  Mouth/Throat: No oropharyngeal exudate.  Eyes: Right eye exhibits no discharge. Left eye exhibits no discharge. No scleral icterus.  Cardiovascular: Normal rate, regular rhythm and normal heart sounds.   No murmur heard. Pulmonary/Chest: Effort normal and breath sounds normal. No respiratory distress. She has no wheezes.  Lymphadenopathy:    She has no cervical adenopathy.  Skin: Skin is warm and dry. No rash noted.  Psychiatric: She has a normal mood and affect. Her behavior is normal.          Assessment & Plan:

## 2013-11-03 NOTE — Telephone Encounter (Signed)
Patient has been to the ED several times recently, has not followed up with [facility] for case management, has no showed appointments.  Will refer to [facility]. Landis Gandy, RN

## 2013-11-03 NOTE — Assessment & Plan Note (Signed)
I wil check labs today.  If ok, rtc 4 months.  If not, patient will be called.

## 2013-11-03 NOTE — Telephone Encounter (Signed)
ATC unable to leave vmail/mailbox full

## 2013-11-04 ENCOUNTER — Other Ambulatory Visit: Payer: Self-pay | Admitting: Licensed Clinical Social Worker

## 2013-11-04 ENCOUNTER — Telehealth: Payer: Self-pay | Admitting: Internal Medicine

## 2013-11-04 DIAGNOSIS — B20 Human immunodeficiency virus [HIV] disease: Secondary | ICD-10-CM

## 2013-11-04 LAB — CBC WITH DIFFERENTIAL/PLATELET
BASOS ABS: 0 10*3/uL (ref 0.0–0.1)
Basophils Relative: 0 % (ref 0–1)
EOS ABS: 0.1 10*3/uL (ref 0.0–0.7)
EOS PCT: 1 % (ref 0–5)
HCT: 40.1 % (ref 36.0–46.0)
Hemoglobin: 13.8 g/dL (ref 12.0–15.0)
Lymphocytes Relative: 27 % (ref 12–46)
Lymphs Abs: 3.2 10*3/uL (ref 0.7–4.0)
MCH: 29.1 pg (ref 26.0–34.0)
MCHC: 34.4 g/dL (ref 30.0–36.0)
MCV: 84.4 fL (ref 78.0–100.0)
Monocytes Absolute: 0.8 10*3/uL (ref 0.1–1.0)
Monocytes Relative: 7 % (ref 3–12)
Neutro Abs: 7.7 10*3/uL (ref 1.7–7.7)
Neutrophils Relative %: 65 % (ref 43–77)
PLATELETS: 379 10*3/uL (ref 150–400)
RBC: 4.75 MIL/uL (ref 3.87–5.11)
RDW: 14.6 % (ref 11.5–15.5)
WBC: 11.8 10*3/uL — ABNORMAL HIGH (ref 4.0–10.5)

## 2013-11-04 MED ORDER — ELVITEG-COBIC-EMTRICIT-TENOFDF 150-150-200-300 MG PO TABS: 1.0000 | ORAL_TABLET | Freq: Every day | ORAL | Status: DC

## 2013-11-04 MED ORDER — OXYCODONE-ACETAMINOPHEN 5-325 MG PO TABS: 1.0000 | ORAL_TABLET | Freq: Four times a day (QID) | ORAL | Status: DC | PRN

## 2013-11-04 NOTE — Telephone Encounter (Signed)
Called and spoke with pt and she stated that she has been in the hospital and was given rx for the oxycodone on 10/23/13 for #40  From Dr. Barton Dubois.  Pt stated that she needs a refill of this medication to last her until her appt on 6-9 with Dr. Roxan Hockey.   Pt is requesting that MW fill this for her.  MW please advise. Thanks  Allergies  Allergen Reactions  . Gabapentin Swelling     Current Outpatient Prescriptions on File Prior to Visit  Medication Sig Dispense Refill  . albuterol (PROVENTIL) (2.5 MG/3ML) 0.083% nebulizer solution Take 6 mLs (5 mg total) by nebulization every 6 (six) hours as needed for wheezing or shortness of breath.  75 mL  2  . budesonide-formoterol (SYMBICORT) 160-4.5 MCG/ACT inhaler Inhale 2 puffs into the lungs 2 (two) times daily.  1 Inhaler  12  . divalproex (DEPAKOTE) 250 MG DR tablet Take 250-500 mg by mouth 2 (two) times daily. 1 in am, 2 in pm      . elvitegravir-cobicistat-emtricitabine-tenofovir (STRIBILD) 150-150-200-300 MG TABS tablet Take 1 tablet by mouth daily with breakfast.  30 tablet  6  . guaiFENesin (MUCINEX) 600 MG 12 hr tablet Take 1 tablet (600 mg total) by mouth 2 (two) times daily.  30 tablet  0  . Ipratropium-Albuterol (COMBIVENT) 20-100 MCG/ACT AERS respimat Inhale 1 puff into the lungs every 6 (six) hours as needed for wheezing or shortness of breath.  1 Inhaler  3  . oxyCODONE-acetaminophen (ROXICET) 5-325 MG per tablet Take 1 tablet by mouth every 6 (six) hours as needed for severe pain.  40 tablet  0  . pantoprazole (PROTONIX) 40 MG tablet Take 1 tablet (40 mg total) by mouth daily.  30 tablet  1  . predniSONE (DELTASONE) 20 MG tablet Take 3 tablets by mouth X 3 days; then 2 tablets by mouth X 3 days; then 1 tablet by mouth X 3 days; then 1/2 tablet by mouth X 3 days and stop prednisone  22 tablet  0  . QUEtiapine (SEROQUEL) 200 MG tablet Take 200 mg by mouth at bedtime.      . traZODone (DESYREL) 150 MG tablet Take 75 mg by mouth at  bedtime.       No current facility-administered medications on file prior to visit.

## 2013-11-04 NOTE — Telephone Encounter (Signed)
lmomtcb x1 for pt at # listed above

## 2013-11-04 NOTE — Telephone Encounter (Signed)
Ok but last one, all further refills per Dr Elita Quick, her primary or one of these two can refer her to the pain clinic

## 2013-11-04 NOTE — Telephone Encounter (Signed)
rx has been printed out and given by MW to sign.   Called and spoke with pt and she is aware that rx is up front and ready to be picked up. Nothing further is needed.

## 2013-11-05 LAB — HIV-1 RNA QUANT-NO REFLEX-BLD
HIV 1 RNA QUANT: 77 {copies}/mL — AB (ref <20)
HIV-1 RNA QUANT, LOG: 1.89 {Log} — AB (ref <1.30)

## 2013-11-05 LAB — T-HELPER CELL (CD4) - (RCID CLINIC ONLY)
CD4 % Helper T Cell: 42 % (ref 33–55)
CD4 T Cell Abs: 1320 /uL (ref 400–2700)

## 2013-11-05 NOTE — ED Provider Notes (Signed)
Medical screening examination/treatment/procedure(s) were performed by non-physician practitioner and as supervising physician I was immediately available for consultation/collaboration.   EKG Interpretation   Date/Time:  Thursday Oct 28 2013 13:18:03 EDT Ventricular Rate:  99 PR Interval:  117 QRS Duration: 99 QT Interval:  367 QTC Calculation: 471 R Axis:   51 Text Interpretation:  Sinus rhythm Borderline T abnormalities, lateral  leads Baseline wander in lead(s) V2 V3 V5 V6 No significant change since  last tracing 20 Oct 2013 Confirmed by [facility]  MD-I, Dori Devino ([id number]) on  10/28/2013 1:26:08 PM      Rolland Porter, MD, Alanson Aly, MD 11/05/13 1304

## 2013-11-05 NOTE — Telephone Encounter (Signed)
LMOM TCB x3 on home number listed  Called pt on cell number - spoke with patient who reported that she is feeling better since calling the office on 5/28 (she went to the ED that same day and was also admitted from 5/20-5/23 for COPD exacerbation).  Advised pt that she should follow up in the office as ED follow up for her symptoms >> appt scheduled with MW 6.10.15 @ 3.45pm (pt unable to come before that date).  Pt okay with this date and time; will call the office for sooner follow up if symptoms worsen.  Nothing further needed at this time; will sign off.

## 2013-11-08 ENCOUNTER — Other Ambulatory Visit: Payer: Self-pay | Admitting: Thoracic Surgery (Cardiothoracic Vascular Surgery)

## 2013-11-08 DIAGNOSIS — J441 Chronic obstructive pulmonary disease with (acute) exacerbation: Secondary | ICD-10-CM

## 2013-11-09 ENCOUNTER — Ambulatory Visit (INDEPENDENT_AMBULATORY_CARE_PROVIDER_SITE_OTHER): Payer: Self-pay | Admitting: Thoracic Surgery (Cardiothoracic Vascular Surgery)

## 2013-11-09 ENCOUNTER — Ambulatory Visit
Admission: RE | Admit: 2013-11-09 | Discharge: 2013-11-09 | Disposition: A | Discharge status: Home / Self Care | Payer: No Typology Code available for payment source | Source: Ambulatory Visit | Attending: Thoracic Surgery (Cardiothoracic Vascular Surgery) | Admitting: Thoracic Surgery (Cardiothoracic Vascular Surgery)

## 2013-11-09 ENCOUNTER — Other Ambulatory Visit: Payer: Self-pay | Admitting: Thoracic Surgery (Cardiothoracic Vascular Surgery)

## 2013-11-09 ENCOUNTER — Encounter: Payer: Self-pay | Admitting: Thoracic Surgery (Cardiothoracic Vascular Surgery)

## 2013-11-09 VITALS — BP 114/80 | HR 96 | Resp 20 | Ht 61.0 in | Wt 136.0 lb

## 2013-11-09 DIAGNOSIS — C3410 Malignant neoplasm of upper lobe, unspecified bronchus or lung: Secondary | ICD-10-CM

## 2013-11-09 DIAGNOSIS — Z902 Acquired absence of lung [part of]: Secondary | ICD-10-CM

## 2013-11-09 DIAGNOSIS — J441 Chronic obstructive pulmonary disease with (acute) exacerbation: Secondary | ICD-10-CM

## 2013-11-09 DIAGNOSIS — C3490 Malignant neoplasm of unspecified part of unspecified bronchus or lung: Secondary | ICD-10-CM

## 2013-11-09 DIAGNOSIS — Z9889 Other specified postprocedural states: Secondary | ICD-10-CM

## 2013-11-09 NOTE — Progress Notes (Signed)
HPI:  Elizabeth [name] returns today for a scheduled followup visit.  She is a 42 year old woman with a history of tobacco abuse and HIV. She underwent a right upper lobectomy for a T2 N0 non-small cell carcinoma on February 2. Her last office visit was in early April. At that time she was still a great deal of pain. She was taking oxycodone for that. She was still smoking at that time.  Today she has multiple complaints. She says that she has had a severe cough and that has aggravated her pain. She's been coughing up a large amount of mucus. She denies hemoptysis. She says that she quit smoking 3 days ago. Says that she still is a great deal of pain causes her considerable distress. She is under stress because her son was arrested.  Past Medical History  Diagnosis Date  . HIV (human immunodeficiency virus infection)     takes Stribild daily  . Substance abuse   . Asthma     uses Albuterol inhaler daily as needed  . Shortness of breath     sitting/lying/exertion  . Headache(784.0)   . History of migraine     last one about 2wks ago  . Seizures     takes Depakote daily-last one over a week ago  . Dizziness   . Dysphagia   . Urinary frequency   . Nocturia   . Depression     takes Trazodone and Seroquel nightly  . Lung cancer       Current Outpatient Prescriptions  Medication Sig Dispense Refill  . albuterol (PROVENTIL) (2.5 MG/3ML) 0.083% nebulizer solution Take 6 mLs (5 mg total) by nebulization every 6 (six) hours as needed for wheezing or shortness of breath.  75 mL  2  . budesonide-formoterol (SYMBICORT) 160-4.5 MCG/ACT inhaler Inhale 2 puffs into the lungs 2 (two) times daily.  1 Inhaler  12  . divalproex (DEPAKOTE) 250 MG DR tablet Take 250-500 mg by mouth 2 (two) times daily. 1 in am, 2 in pm      . elvitegravir-cobicistat-emtricitabine-tenofovir (STRIBILD) 150-150-200-300 MG TABS tablet Take 1 tablet by mouth daily with breakfast.  30 tablet  6  . guaiFENesin (MUCINEX) 600 MG  12 hr tablet Take 1 tablet (600 mg total) by mouth 2 (two) times daily.  30 tablet  0  . Ipratropium-Albuterol (COMBIVENT) 20-100 MCG/ACT AERS respimat Inhale 1 puff into the lungs every 6 (six) hours as needed for wheezing or shortness of breath.  1 Inhaler  3  . oxyCODONE-acetaminophen (ROXICET) 5-325 MG per tablet Take 1 tablet by mouth every 6 (six) hours as needed for severe pain.  20 tablet  0  . pantoprazole (PROTONIX) 40 MG tablet Take 1 tablet (40 mg total) by mouth daily.  30 tablet  1  . QUEtiapine (SEROQUEL) 200 MG tablet Take 200 mg by mouth at bedtime.      . traZODone (DESYREL) 150 MG tablet Take 75 mg by mouth at bedtime.       No current facility-administered medications for this visit.    Physical Exam BP 114/80  Pulse 96  Resp 20  Ht 5' 1"  (1.549 m)  Wt 136 lb (61.689 kg)  BMI 25.71 kg/m2  SpO2 99%  LMP 11/01/2013 42 year old woman in no acute distress, anxious No cervical or suprapubic or adenopathy Lungs clear with equal breath sounds bilaterally Incisions well healed  Diagnostic Tests: Chest x-ray 11/09/2013 CHEST 2 VIEW  COMPARISON: 10/28/2013 and earlier.  FINDINGS:  Stable volume loss  in the right hemi thorax and postoperative  architectural distortion about the right hilum. No pleural effusion.  No acute pulmonary opacity. Stable cardiac size and mediastinal  contours. Visualized tracheal air column is within normal limits. No  acute osseous abnormality identified.  IMPRESSION:  Stable and satisfactory postoperative appearance of the chest.  Electronically Signed  By: Elizabeth [name] M.D.  On: 11/09/2013 12:28  Impression: 43 year old woman who is now 4 months post right upper lobectomy for a non-small cell carcinoma. This was a stage IB lesion. No radiation or chemotherapy was required. The lesion was resected in an extrapleural plane, but the parietal pleura was not involved.  She was admitted back in May and had a CT at that time which showed no  evidence of recurrent disease. Her chest x-ray today also shows no evidence of recurrence.  She continues to have pain which was present prior to her surgery.  She had continued to smoke up until she quit 3 days ago due to a severe cough. We will see if that holds up, I am not optimistic.  Plan:  Return in 4 months. We will do a CT of the chest at that time.

## 2013-11-10 ENCOUNTER — Ambulatory Visit (INDEPENDENT_AMBULATORY_CARE_PROVIDER_SITE_OTHER): Payer: No Typology Code available for payment source | Admitting: Internal Medicine

## 2013-11-10 ENCOUNTER — Encounter: Payer: Self-pay | Admitting: Internal Medicine

## 2013-11-10 VITALS — BP 106/80 | HR 87 | Temp 98.6°F | Ht 61.0 in | Wt 136.0 lb

## 2013-11-10 DIAGNOSIS — J449 Chronic obstructive pulmonary disease, unspecified: Secondary | ICD-10-CM

## 2013-11-10 MED ORDER — PANTOPRAZOLE SODIUM 40 MG PO TBEC: 40.0000 mg | DELAYED_RELEASE_TABLET | Freq: Every day | ORAL | Status: DC

## 2013-11-10 MED ORDER — FAMOTIDINE 20 MG PO TABS
ORAL_TABLET | ORAL | Status: DC
Start: 2013-11-10 — End: 2013-11-15

## 2013-11-10 NOTE — Progress Notes (Signed)
Subjective:    Patient ID: Elizabeth [name], female    DOB: Dec 21, 1970  MRN: [id number]   Brief patient profile:  42 yowf HIV since 2008 actively smoking referred to pulmonary clinic 04/16/2013 by Elizabeth comer with sob and cough and RUL density with plans for excisional bx 07/05/13.    History of Present Illness  04/16/2013 1st [facility] office visit/ Elizabeth [name]  Chief Complaint  Patient presents with  . Pulmonary Consult    Referred per Elizabeth. Gara Kroner for eval of abn CT Chest. The pt c/o CP and dyspnea x 5 months. She states that she gets out of breath just walking from one room to the next and wakes up gasping for air. She also c/o prod cough with minimal yellow sputum x 5 months.    cp anterior x onset early Oct 2014 , worse with cough. Sputum never bloody  zpak helped some  saba helps doe some  Poor dentition and having active Seizures since July 2013 (denies ever seeing neuorlogist as outpt) rec The key is to stop smoking completely before smoking completely stops you!  Augmentin 875 mg take one pill twice daily  X 10 days       04/30/2013 f/u ov/Elizabeth [name] still smoking  re: RUL density  Chief Complaint  Patient presents with  . Follow-up    Pt c/o SOB constantly, waking her up at night.  Pt went to ED Saturday for seizures. Pt feels worse now than at her lv 2wks ago.   Hurt worse since ran out of pain meds but was needing to up to 2 every 4 hours until 3 days prior to OV  And no better p augmentin. Mucus remains dark but not bloody. Since ov had multiple sz's but not seeing in neurologist Doe and at rest not better p saba Pain / sob constant now 24/7 and generalized anterior on R, localized on R post below scapula tip rec Fob > done 05/05/13 with hemoptysis immediately p procedure   05/08/2013 acute  ov/Elizabeth [name] re: hemoptysis/ gagging from coughing  Chief Complaint  Patient presents with  . Acute Visit    pt c/o nausea, cannot eat anything. Pt also c/o coughing up blood since  procedure. I advised the pt that this is normal, but triage advised her to come in for OV.    no sob at rest Amt of brb is tapering off to less than a tsp on day of ov - total amt about a quarter cup mixed with saliva. rec Keep taking the hydrocodone up to 2 every 6 hours for pain or cough For nausea phenergan  12.5 mg 1-2 every 6 hours as needed  Soups, broth based first, then add noodles and crackers  Last = veggies/ salads/ dairy products We will arrange for PET scan next > c/w ? Limited  ca so referred for T surg    06/29/2013 f/u ov/Elizabeth [name] re: pain control  Chief Complaint  Patient presents with  . Follow-up    Pt here to discuss pain management- Elizabeth [name] advised he would not prescribe anything until after her surgery 07/05/13.     Percocet 2 every 6 = 8 per day = 60 needed until surgery (delayed by surg and eval at [facility]) Still smoking  rec Ok to continue to take the percocet up to 2 every 6 hours if needed to control the pain Keep all follow up appts with Elizabeth [name] who will order your post op pain meds and refer you either  back here or to the appropriate specialist for out patient follow up once we know the diagnsosis The key is to stop smoking completely before smoking completely stops you- this must be done now to reduce your risk of pulmonary complications from surgery     T surgery   wedge 07/05/13  POS [place] Lung Ca T2 N0 Mx   10/08/2013 acute ov/Elizabeth [name] re: post op CP/ hemoptysis/ still smoking Chief Complaint  Patient presents with  . Acute Visit    Pt c/o hemoptysis x 2 wks, and still having pain in her rt side. Her breathing has been worse since her lung surgery. She states using albuterol inhaler at least 3 times per day.   the pain on R is completely different that preop post cp and assoc with area of burning numbness along rib distribution and 6th ICS on right all the way anteriorly to midline  Hemoptysis is only streaky with no more than a tsp or two per day total,  no purulent sputum and no epistaxis rec Trazadone 200 mg take one half every night perfectly regularly and if you still can't tolerate the side effect we need to try you on the 50 mg strength and build up  Percocet every 4 hours as needed for cough or pain  The key is to stop smoking completely before smoking completely stops you!  Zpak zostrix cream 4 x daily along the distribution of the pain (over the counter)    Admit date: 10/20/2013  Discharge date: 10/23/2013   Discharge Diagnoses:  Acute resp failure  COPD exacerbation  HIV INFECTION  BREAST MASS, LEFT  Pulmonary mass s/p resection  Post-thoracotomy pain syndrome  Depression  Tobacco abuse  GERD   . 11/10/2013 post hosp f/u ov/Elizabeth [name] re: copdIII/ quit smoking 11/01/13 - cw pain better/ completing abx and pred  Chief Complaint  Patient presents with  . HFU    Pt states quit smoking x 10 days ago. Breathing is some better but c/o prod cough with minimal yellow sputum x 3 days.  She is using combivent at least 3 x per day.   comfortable at rest, doe 75 ft/ congested cough symbicort 160  empty Neb albuterol/not using combivent first  No obvious day to day or daytime variabilty or assoc  chest tightness, subjective wheeze overt sinus or hb symptoms. No unusual exp hx or h/o childhood pna/ asthma or knowledge of premature birth.    Also denies any obvious fluctuation of symptoms with weather or environmental changes or other aggravating or alleviating factors except as outlined above   Current Medications, Allergies, Complete Past Medical History, Past Surgical History, Family History, and Social History were reviewed in [employer] record.  ROS  The following are not active complaints unless bolded sore throat, dysphagia, dental problems, itching, sneezing,  nasal congestion or excess/ purulent secretions, ear ache,   fever, chills, sweats, unintended wt loss, pleuritic or exertional cp, hemoptysis,   orthopnea pnd or leg swelling, presyncope, palpitations, heartburn, abdominal pain, anorexia, nausea, vomiting, diarrhea  or change in bowel or urinary habits, change in stools or urine, dysuria,hematuria,  rash, arthralgias, visual complaints, headache, numbness weakness or ataxia or problems with walking or coordination,  change in mood/affect or memory.                  Objective:   Physical Exam   Anxious wf nad  .06/29/2013      149 > 10/09/2013 137> 11/10/2013  136  Wt Readings from Last 3 Encounters:  05/06/13 149 lb 12.8 oz (67.949 kg)  04/30/13 149 lb (67.586 kg)  04/16/13 149 lb (67.586 kg)        HEENT mild turbinate edema.  Oropharynx no thrush or excess pnd or cobblestoning.  No JVD or cervical adenopathy. Mild accessory muscle hypertrophy. Trachea midline, nl thryroid. Chest was hyperinflated by percussion with diminished breath sounds and moderate increased exp time with mid bilateral junky exp wheeze. Hoover sign positive at mid inspiration. Regular rate and rhythm without murmur gallop or rub or increase P2 or edema.  Abd: no hsm, nl excursion. Ext warm without cyanosis or clubbing.  No rash or cw tenderness   CTa  10/05/13 Post op changes only     Assessment & Plan:

## 2013-11-10 NOTE — Patient Instructions (Addendum)
Plan A = Automatic = Symbicort 160 Take 2 puffs first thing in am and then another 2 puffs about 12 hours later - work on your technique  Plan B = back up = combivent one puff every 4 hours if needed   Plan C = Back up for Plan B = only use the nebulizer if Plan B fails, ok to use up to 4 hours   Pantoprazole (protonix) 40 mg   Take 30-60 min before first meal of the day and Pepcid 20 mg one bedtime until return to office - this is the best way to tell whether stomach acid is contributing to your problem.    GERD (REFLUX)  is an extremely common cause of respiratory symptoms, many times with no significant heartburn at all.    It can be treated with medication, but also with lifestyle changes including avoidance of late meals, excessive alcohol, smoking cessation, and avoid fatty foods, chocolate, peppermint, colas, red wine, and acidic juices such as orange juice.  NO MINT OR MENTHOL PRODUCTS SO NO COUGH DROPS  USE SUGARLESS CANDY INSTEAD (jolley ranchers or Stover's)  NO OIL BASED VITAMINS - use powdered substitutes.  Please schedule a follow up office visit in 2 weeks, sooner if needed to see Tammy NP

## 2013-11-11 NOTE — Assessment & Plan Note (Addendum)
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%) ratio 60 and no sign resp to B2 with dlco 64 corrects to 79%   DDX of  difficult airways managment all start with A and  include Adherence, Ace Inhibitors, Acid Reflux, Active Sinus Disease, Alpha 1 Antitripsin deficiency, Anxiety masquerading as Airways dz,  ABPA,  allergy(esp in young), Aspiration (esp in elderly), Adverse effects of DPI,  Active smokers, plus two Bs  = Bronchiectasis and Beta blocker use..and one C= CHF  Adherence is always the initial "prime suspect" and is a multilayered concern that requires a "trust but verify" approach in every patient - starting with knowing how to use medications, especially inhalers, correctly, keeping up with refills and understanding the fundamental difference between maintenance and prns vs those medications only taken for a very short course and then stopped and not refilled.  The proper method of use, as well as anticipated side effects, of a metered-dose inhaler are discussed and demonstrated to the patient. Improved effectiveness after extensive coaching during this visit to a level of approximately  75% - using empty symbicort > sample given with plans to f/u in 2 weeks  Active smoking > denies  X 10 days, reinforced importance  ? Acid (or non-acid) GERD > always difficult to exclude as up to 75% of pts in some series report no assoc GI/ Heartburn symptoms> rec max (24h)  acid suppression and diet restrictions/ reviewed and instructions given in writing.     Each maintenance medication was reviewed in detail including most importantly the difference between maintenance and as needed and under what circumstances the prns are to be used.  Please see instructions for details which were reviewed in writing and the patient given a copy.

## 2013-11-14 ENCOUNTER — Encounter (HOSPITAL_COMMUNITY): Payer: Self-pay | Admitting: Emergency Medicine

## 2013-11-14 ENCOUNTER — Emergency Department (HOSPITAL_COMMUNITY)
Admission: EM | Admit: 2013-11-14 | Discharge: 2013-11-15 | Disposition: A | Discharge status: Home / Self Care | Payer: Medicaid Other | Attending: Emergency Medicine | Admitting: Emergency Medicine

## 2013-11-14 DIAGNOSIS — R569 Unspecified convulsions: Secondary | ICD-10-CM | POA: Diagnosis present

## 2013-11-14 DIAGNOSIS — Z79899 Other long term (current) drug therapy: Secondary | ICD-10-CM | POA: Diagnosis not present

## 2013-11-14 DIAGNOSIS — F329 Major depressive disorder, single episode, unspecified: Secondary | ICD-10-CM | POA: Diagnosis not present

## 2013-11-14 DIAGNOSIS — F3289 Other specified depressive episodes: Secondary | ICD-10-CM | POA: Insufficient documentation

## 2013-11-14 DIAGNOSIS — J449 Chronic obstructive pulmonary disease, unspecified: Secondary | ICD-10-CM

## 2013-11-14 DIAGNOSIS — Z85118 Personal history of other malignant neoplasm of bronchus and lung: Secondary | ICD-10-CM | POA: Diagnosis not present

## 2013-11-14 DIAGNOSIS — J441 Chronic obstructive pulmonary disease with (acute) exacerbation: Secondary | ICD-10-CM | POA: Insufficient documentation

## 2013-11-14 DIAGNOSIS — Z21 Asymptomatic human immunodeficiency virus [HIV] infection status: Secondary | ICD-10-CM | POA: Diagnosis not present

## 2013-11-14 DIAGNOSIS — J45901 Unspecified asthma with (acute) exacerbation: Secondary | ICD-10-CM

## 2013-11-14 DIAGNOSIS — G8929 Other chronic pain: Secondary | ICD-10-CM | POA: Diagnosis not present

## 2013-11-14 DIAGNOSIS — Z87891 Personal history of nicotine dependence: Secondary | ICD-10-CM | POA: Diagnosis not present

## 2013-11-14 DIAGNOSIS — G8912 Acute post-thoracotomy pain: Secondary | ICD-10-CM | POA: Diagnosis not present

## 2013-11-14 DIAGNOSIS — G40909 Epilepsy, unspecified, not intractable, without status epilepticus: Secondary | ICD-10-CM

## 2013-11-14 DIAGNOSIS — G43909 Migraine, unspecified, not intractable, without status migrainosus: Secondary | ICD-10-CM | POA: Insufficient documentation

## 2013-11-14 LAB — CBG MONITORING, ED: GLUCOSE-CAPILLARY: 154 mg/dL — AB (ref 70–99)

## 2013-11-14 NOTE — ED Notes (Signed)
Per EMS report: Pt from home: Pt had 2 witnessed gran mal seizures each lasting two minutes apart.  Pt currently a/o x 4. EMS found pt sitting in the chair on home neb tx and was able to answer all questions appropriately. EMS did note any tongue injury or incontinence.  Pt currently c/o of difficulty breathing. EMS gave pt 69m albuterol, 162matrovent, and 12541molumedrol. Pt hx seizure, lung ca pt in which she had part of her lung removed on her right upper side on 2/15, HIV+.

## 2013-11-15 ENCOUNTER — Emergency Department (HOSPITAL_COMMUNITY): Payer: Medicaid Other

## 2013-11-15 LAB — I-STAT CG4 LACTIC ACID, ED: Lactic Acid, Venous: 1.35 mmol/L (ref 0.5–2.2)

## 2013-11-15 LAB — VALPROIC ACID LEVEL

## 2013-11-15 LAB — CBC WITH DIFFERENTIAL/PLATELET
BASOS ABS: 0 10*3/uL (ref 0.0–0.1)
BASOS PCT: 0 % (ref 0–1)
EOS ABS: 0.1 10*3/uL (ref 0.0–0.7)
Eosinophils Relative: 1 % (ref 0–5)
HCT: 35.7 % — ABNORMAL LOW (ref 36.0–46.0)
Hemoglobin: 12.6 g/dL (ref 12.0–15.0)
Lymphocytes Relative: 13 % (ref 12–46)
Lymphs Abs: 1.3 10*3/uL (ref 0.7–4.0)
MCH: 29.8 pg (ref 26.0–34.0)
MCHC: 35.3 g/dL (ref 30.0–36.0)
MCV: 84.4 fL (ref 78.0–100.0)
Monocytes Absolute: 0.2 10*3/uL (ref 0.1–1.0)
Monocytes Relative: 2 % — ABNORMAL LOW (ref 3–12)
NEUTROS PCT: 84 % — AB (ref 43–77)
Neutro Abs: 8.6 10*3/uL — ABNORMAL HIGH (ref 1.7–7.7)
PLATELETS: 281 10*3/uL (ref 150–400)
RBC: 4.23 MIL/uL (ref 3.87–5.11)
RDW: 14.7 % (ref 11.5–15.5)
WBC: 10.3 10*3/uL (ref 4.0–10.5)

## 2013-11-15 LAB — BASIC METABOLIC PANEL
BUN: 14 mg/dL (ref 6–23)
CO2: 22 mEq/L (ref 19–32)
Calcium: 8.9 mg/dL (ref 8.4–10.5)
Chloride: 102 mEq/L (ref 96–112)
Creatinine, Ser: 0.58 mg/dL (ref 0.50–1.10)
Glucose, Bld: 168 mg/dL — ABNORMAL HIGH (ref 70–99)
POTASSIUM: 3.6 meq/L — AB (ref 3.7–5.3)
SODIUM: 136 meq/L — AB (ref 137–147)

## 2013-11-15 MED ORDER — OXYCODONE-ACETAMINOPHEN 5-325 MG PO TABS
2.0000 | ORAL_TABLET | Freq: Once | ORAL | Status: AC
  Administered 2013-11-15: 2 via ORAL
  Filled 2013-11-15: qty 2

## 2013-11-15 MED ORDER — PREDNISONE 20 MG PO TABS: 60.0000 mg | ORAL_TABLET | Freq: Every day | ORAL | Status: DC

## 2013-11-15 MED ORDER — DIVALPROEX SODIUM 500 MG PO DR TAB
750.0000 mg | DELAYED_RELEASE_TABLET | Freq: Once | ORAL | Status: AC
  Administered 2013-11-15: 750 mg via ORAL
  Filled 2013-11-15 (×2): qty 1

## 2013-11-15 NOTE — ED Notes (Signed)
Dr Otter at bedside  

## 2013-11-15 NOTE — Discharge Instructions (Signed)
Your Depakote level today was undetectable, this most likely caused her seizure.  Please discuss with your neurologist how you need to increase your dosing of your Depakote.  You've been given an extra dose here tonight.  Please followup with your pulmonary Dr. within the week for recheck as well.  Return emergency room for worsening condition or new concerning symptoms.   Chronic Obstructive Pulmonary Disease Chronic obstructive pulmonary disease (COPD) is a common lung problem. In COPD, the flow of air from the lungs is limited. The way your lungs work will probably never return to normal, but there are things you can do to improve you lungs and make yourself feel better. HOME CARE  Take all medicines as told by your doctor.  Only take over-the-counter or prescription medicines as told by your doctor.  Avoid medicines or cough syrups that dry up your airway (such as antihistamines) and do not allow you to get rid of thick spit. You do not need to avoid them if told differently by your doctor.  If you smoke, stop. Smoking makes the problem worse.  Avoid being around things that make your breathing worse (like smoke, chemicals, and fumes).  Use oxygen therapy and therapy to help improve your lungs (pulmonary rehabilitation) if told by your doctor. If you need home oxygen therapy, ask your doctor if you should buy a tool to measure your oxygen level (oximeter).  Avoid people who have a sickness you can catch (contagious).  Avoid going outside when it is very hot, cold, or humid.  Eat healthy foods. Eat smaller meals more often. Rest before meals.  Stay active, but remember to also rest.  Make sure to get all the shots (vaccines) your doctor recommends. Ask your doctor if you need a pneumonia shot.  Learn and use tips on how to relax.  Learn and use tips on how to control your breathing as told by your doctor. Try:  Breathing in (inhaling) through your nose for 1 second. Then, pucker  your lips and breath out (exhale) through your lips for 2 seconds.  Putting one hand on your belly (abdomen). Breathe in slowly through your nose for 1 second. Your hand on your belly should move out. Pucker your lips and breathe out slowly through your lips. Your hand on your belly should move in as you breathe out.  Learn and use controlled coughing to clear thick spit from your lungs. 1. Lean your head a little forward. 2. Breathe in deeply. 3. Try to hold your breath for 3 seconds. 4. Keep your mouth slightly open while coughing 2 times. 5. Spit any thick spit out into a tissue. 6. Rest and do the steps again 1 or 2 times as needed. GET HELP IF:  You cough up more thick spit than usual.  There is a change in the color or thickness of the spit.  It is harder to breathe than usual.  Your breathing is faster than usual. GET HELP RIGHT AWAY IF:   You have shortness of breath while resting.  You have shortness of breath that stops you from:  Being able to talk.  Doing normal activities.  You chest hurts for longer than 5 minutes.  Your skin color is more blue than usual.  Your pulse oximeter shows that you have low oxygen for longer than 5 minutes. MAKE SURE YOU:   Understand these instructions.  Will watch your condition.  Will get help right away if you are not doing well or  get worse. Document Released: 11/06/2007 Document Revised: 03/10/2013 Document Reviewed: 01/14/2013 [facility] Patient Information 2014 [place], [place].  Driving and Equipment Restrictions Some medical problems make it dangerous to drive, ride a bike, or use machines. Some of these problems are:  A hard blow to the head (concussion).  Passing out (fainting).  Twitching and shaking (seizures).  Low blood sugar.  Taking medicine to help you relax (sedatives).  Taking pain medicines.  Wearing an eye patch.  Wearing splints. This can make it hard to use parts of your body that you need to  drive safely. HOME CARE   Do not drive until your doctor says it is okay.  Do not use machines until your doctor says it is okay. You may need a form signed by your doctor (medical release) before you can drive again. You may also need this form before you do other tasks where you need to be fully alert. MAKE SURE YOU:  Understand these instructions.  Will watch your condition.  Will get help right away if you are not doing well or get worse. Document Released: 06/27/2004 Document Revised: 08/12/2011 Document Reviewed: 09/27/2009 [facility] Patient Information 2014 [place].  Seizure, Adult A seizure is abnormal electrical activity in the brain. Seizures usually last from 30 seconds to 2 minutes. There are various types of seizures. Before a seizure, you may have a warning sensation (aura) that a seizure is about to occur. An aura may include the following symptoms:   Fear or anxiety.  Nausea.  Feeling like the room is spinning (vertigo).  Vision changes, such as seeing flashing lights or spots. Common symptoms during a seizure include:  A change in attention or behavior (altered mental status).  Convulsions with rhythmic jerking movements.  Drooling.  Rapid eye movements.  Grunting.  Loss of bladder and bowel control.  Bitter taste in the mouth.  Tongue biting. After a seizure, you may feel confused and sleepy. You may also have an injury resulting from convulsions during the seizure. HOME CARE INSTRUCTIONS   If you are given medicines, take them exactly as prescribed by your health care provider.  Keep all follow-up appointments as directed by your health care provider.  Do not swim or drive or engage in risky activity during which a seizure could cause further injury to you or others until your health care provider says it is OK.  Get adequate rest.  Teach friends and family what to do if you have a seizure. They should:  Lay you on the ground to  prevent a fall.  Put a cushion under your head.  Loosen any tight clothing around your neck.  Turn you on your side. If vomiting occurs, this helps keep your airway clear.  Stay with you until you recover.  Know whether or not you need emergency care. SEEK IMMEDIATE MEDICAL CARE IF:  The seizure lasts longer than 5 minutes.  The seizure is severe or you do not wake up immediately after the seizure.  You have an altered mental status after the seizure.  You are having more frequent or worsening seizures. Someone should drive you to the emergency department or call local emergency services (911 in [place] MAKE SURE YOU:  Understand these instructions.  Will watch your condition.  Will get help right away if you are not doing well or get worse. Document Released: 05/17/2000 Document Revised: 03/10/2013 Document Reviewed: 12/30/2012 [facility] Patient Information 2014 [place].

## 2013-11-15 NOTE — ED Provider Notes (Signed)
CSN: [id number]     Arrival date & time 11/14/13  2254 History   First MD Initiated Contact with Patient 11/14/13 2344     Chief Complaint  Patient presents with  . Seizures  . Shortness of Breath     (Consider location/radiation/quality/duration/timing/severity/associated sxs/prior Treatment) HPI 43 year old female presents to emergency room with 2 complaints.  Patient had 2 tonoclonic seizures today witnessed by bystanders at home.  Patient has history of seizure disorder, is on Depakote.  She denies missing any doses of her medication.  After her seizure, patient had shortness of breath and right anterior chest pain.  Patient has history of COPD as well as lung cancer status post lobectomy on the right 3 months ago.  Patient is also HIV positive.  She reports that she has run out of her pain medicine today, and has pain to her anterior chest similar to ongoing pain for she takes Percocet.  She denies any fever chills, no change in her sputum.  Her shortness of breath is improved after albuterol and Atrovent.  She denies any injuries secondary to her seizure. Past Medical History  Diagnosis Date  . HIV (human immunodeficiency virus infection)     takes Stribild daily  . Substance abuse   . Asthma     uses Albuterol inhaler daily as needed  . Shortness of breath     sitting/lying/exertion  . Headache(784.0)   . History of migraine     last one about 2wks ago  . Seizures     takes Depakote daily-last one over a week ago  . Dizziness   . Dysphagia   . Urinary frequency   . Nocturia   . Depression     takes Trazodone and Seroquel nightly  . Lung cancer    Past Surgical History  Procedure Laterality Date  . Cesarean section  55yr ago  . Video bronchoscopy Bilateral 05/05/2013    Procedure: VIDEO BRONCHOSCOPY WITH FLUORO;  Surgeon: MTanda Rockers MD;  Location: [facility];  Service: Cardiopulmonary;  Laterality: Bilateral;  . Video assisted thoracoscopy (vats)/wedge resection  Right 07/05/2013    Procedure: VIDEO ASSISTED THORACOSCOPY (VATS)/WEDGE RESECTION, Right Upper Lobectomy with lymph node disecction and OnQ placement;  Surgeon: SMelrose Nakayama MD;  Location: [facility];  Service: Thoracic;  Laterality: Right;  (R)VATS, WEDGE RESECTION, POSSIBLE LOBECTOMY, POSSIBLE CHEST WALL RESECTION   Family History  Problem Relation Age of Onset  . Diabetes Mother   . CAD Mother   . Hypertension Mother   . Hypertension Sister   . Lung cancer Maternal Aunt     smoked  . Allergies Sister   . Asthma Sister   . Lung cancer Maternal Uncle     never smoker  . Lung cancer Maternal Grandmother     never smoker   History  Substance Use Topics  . Smoking status: Former Smoker -- 0.25 packs/day for 24 years    Types: Cigarettes    Quit date: 11/01/2013  . Smokeless tobacco: Never Used  . Alcohol Use: No   OB History   Grav Para Term Preterm Abortions TAB SAB Ect Mult Living                 Review of Systems   See History of Present Illness; otherwise all other systems are reviewed and negative  Allergies  Gabapentin  Home Medications   Prior to Admission medications   Medication Sig Start Date End Date Taking? Authorizing Provider  albuterol (PROVENTIL) (2.5  MG/3ML) 0.083% nebulizer solution Take 6 mLs (5 mg total) by nebulization every 6 (six) hours as needed for wheezing or shortness of breath. 10/28/13  Yes Tatyana A Kirichenko, PA-C  budesonide-formoterol (SYMBICORT) 160-4.5 MCG/ACT inhaler Inhale 2 puffs into the lungs 2 (two) times daily. 10/23/13  Yes Barton Dubois, MD  divalproex (DEPAKOTE) 250 MG DR tablet Take 250-500 mg by mouth 2 (two) times daily. 1 in am, 2 in pm   Yes Historical Provider, MD  elvitegravir-cobicistat-emtricitabine-tenofovir (STRIBILD) 150-150-200-300 MG TABS tablet Take 1 tablet by mouth daily with breakfast. 11/04/13  Yes Thayer Headings, MD  famotidine (PEPCID) 20 MG tablet Take 20 mg by mouth at bedtime.   Yes Historical Provider, MD   guaiFENesin (MUCINEX) 600 MG 12 hr tablet Take 1 tablet (600 mg total) by mouth 2 (two) times daily. 10/23/13  Yes Barton Dubois, MD  Ipratropium-Albuterol (COMBIVENT) 20-100 MCG/ACT AERS respimat Inhale 1 puff into the lungs every 6 (six) hours as needed for wheezing or shortness of breath. 10/23/13  Yes Barton Dubois, MD  oxyCODONE-acetaminophen (ROXICET) 5-325 MG per tablet Take 1 tablet by mouth every 6 (six) hours as needed for severe pain. 11/04/13  Yes Tanda Rockers, MD  pantoprazole (PROTONIX) 40 MG tablet Take 1 tablet (40 mg total) by mouth daily. 11/10/13  Yes Tanda Rockers, MD  QUEtiapine (SEROQUEL) 200 MG tablet Take 200 mg by mouth at bedtime.   Yes Historical Provider, MD  traZODone (DESYREL) 150 MG tablet Take 75 mg by mouth at bedtime.   Yes Historical Provider, MD   BP 120/85  Pulse 106  Temp(Src) 98.8 F (37.1 C) (Oral)  Resp 20  SpO2 98%  LMP 11/01/2013 Physical Exam  Nursing note and vitals reviewed. Constitutional: She is oriented to person, place, and time. She appears well-developed and well-nourished.  HENT:  Head: Normocephalic and atraumatic.  Nose: Nose normal.  Mouth/Throat: Oropharynx is clear and moist.  Eyes: Conjunctivae and EOM are normal. Pupils are equal, round, and reactive to light.  Neck: Normal range of motion. Neck supple. No JVD present. No tracheal deviation present. No thyromegaly present.  Cardiovascular: Normal rate, regular rhythm, normal heart sounds and intact distal pulses.  Exam reveals no gallop and no friction rub.   No murmur heard. Pulmonary/Chest: Effort normal. No stridor. No respiratory distress. She has no wheezes. She has no rales. She exhibits tenderness.  Prolonged expiratory phase.  Tenderness to palpation over right anterior chest without step-off or crepitus no overlying skin changes  Abdominal: Soft. Bowel sounds are normal. She exhibits no distension and no mass. There is no tenderness. There is no rebound and no guarding.   Musculoskeletal: Normal range of motion. She exhibits no edema and no tenderness.  Lymphadenopathy:    She has no cervical adenopathy.  Neurological: She is alert and oriented to person, place, and time. She has normal reflexes. No cranial nerve deficit. She exhibits normal muscle tone. Coordination normal.  Skin: Skin is warm and dry. No rash noted. No erythema. No pallor.  Psychiatric: She has a normal mood and affect. Her behavior is normal. Judgment and thought content normal.    ED Course  Procedures (including critical care time) Labs Review Labs Reviewed  VALPROIC ACID LEVEL - Abnormal; Notable for the following:    Valproic Acid Lvl <10.0 (*)    All other components within normal limits  CBC WITH DIFFERENTIAL - Abnormal; Notable for the following:    HCT 35.7 (*)    Neutrophils  Relative % 84 (*)    Neutro Abs 8.6 (*)    Monocytes Relative 2 (*)    All other components within normal limits  BASIC METABOLIC PANEL - Abnormal; Notable for the following:    Sodium 136 (*)    Potassium 3.6 (*)    Glucose, Bld 168 (*)    All other components within normal limits  CBG MONITORING, ED - Abnormal; Notable for the following:    Glucose-Capillary 154 (*)    All other components within normal limits  I-STAT CG4 LACTIC ACID, ED    Imaging Review Dg Chest 2 View  11/15/2013   CLINICAL DATA:  Cough, chest pain, seizure. Immunocompromised patient with history of asthma and lung cancer.  EXAM: CHEST  2 VIEW  COMPARISON:  Chest radiograph November 09, 2013.  FINDINGS: Cardiac silhouette appears mildly enlarged, mediastinal silhouette is nonsuspicious. Increased lung volumes with postsurgical changes of the right upper lobe, tenting of the right hemidiaphragm, unchanged. Flattened hemidiaphragms. No pleural effusions or focal consolidations. No pneumothorax.  Multiple EKG lines overlie the patient and may obscure subtle underlying pathology. Soft tissue planes and included osseous structures are  nonsuspicious.  IMPRESSION: Borderline cardiomegaly, stable chronic changes of the lungs without acute pulmonary process.   Electronically Signed   By: Elon Alas   On: 11/15/2013 01:08     EKG Interpretation   Date/Time:  Sunday November 14 2013 23:01:09 EDT Ventricular Rate:  106 PR Interval:  130 QRS Duration: 97 QT Interval:  347 QTC Calculation: 461 R Axis:   40 Text Interpretation:  Sinus tachycardia Confirmed by Antinette Keough  MD, Joshalyn Ancheta  ([id number]) on 11/15/2013 12:31:40 AM      MDM   Final diagnoses:  Seizure disorder  COPD (chronic obstructive pulmonary disease)  Post-thoracotomy pain syndrome    43 year old female with breakthrough seizure, also with acute on chronic chest wall pain and COPD exacerbation.  Biopsy are unremarkable, chest x-ray unchanged, EKG sinus tachycardia.  Awaiting Depakote level to see if she needs additional Depakote, feel that she'll be safe for discharge home.  2:55 AM subtherapuetic depakote.    Pt to receive extra dose here.  She will contact her neurologist in am.  Has appt with pulm this week.    Kalman Drape, MD 11/15/13 [phone]

## 2013-11-17 ENCOUNTER — Telehealth: Payer: Self-pay | Admitting: Internal Medicine

## 2013-11-17 NOTE — Telephone Encounter (Signed)
[id number] x1 

## 2013-11-17 NOTE — Telephone Encounter (Signed)
Called spoke with pt. She is requesting refill on percocet. Last refilled 11/04/13 #20 x 0 refills. Pt ended back in the ED lat night. She is still awaiting appt w/ pain clinic. She has medicaid reason taking so long for appt.  Please advise MW thanks

## 2013-11-17 NOTE — Telephone Encounter (Signed)
Ok x one only, no more s ov with all meds in hand

## 2013-11-18 MED ORDER — OXYCODONE-ACETAMINOPHEN 5-325 MG PO TABS: 1.0000 | ORAL_TABLET | Freq: Four times a day (QID) | ORAL | Status: DC | PRN

## 2013-11-18 NOTE — Telephone Encounter (Signed)
Pt has appt with TP next week and understands she must keep OV; then follow up with MW as told by TP. Rx has been printed and placed on Leslie's desk for MW to sign. Will need to call patient once ready for pick up.

## 2013-11-18 NOTE — Telephone Encounter (Signed)
Rx was signed and placed up front for pick up  ATC and inform the pt, NA and mailbox not set up  [facility]

## 2013-11-19 ENCOUNTER — Telehealth: Payer: Self-pay | Admitting: *Deleted

## 2013-11-19 NOTE — Telephone Encounter (Signed)
ATC PT. VM is not set up. [facility]cb

## 2013-11-19 NOTE — Telephone Encounter (Signed)
Needing PAP smear appt

## 2013-11-22 NOTE — Telephone Encounter (Signed)
ATC PT NA AND VM NOT SET UP [facility]

## 2013-11-23 ENCOUNTER — Ambulatory Visit (INDEPENDENT_AMBULATORY_CARE_PROVIDER_SITE_OTHER): Payer: Self-pay | Admitting: Adult Health

## 2013-11-23 ENCOUNTER — Encounter: Payer: Self-pay | Admitting: Adult Health

## 2013-11-23 VITALS — BP 124/74 | HR 78 | Temp 98.6°F | Ht 61.0 in | Wt 138.2 lb

## 2013-11-23 DIAGNOSIS — J449 Chronic obstructive pulmonary disease, unspecified: Secondary | ICD-10-CM

## 2013-11-23 NOTE — Patient Instructions (Signed)
Continue on current regimen.  Great job on not smoking -keep up good work .  Follow up Dr. Melvyn Novas  In 6-8 weeks and As needed   Please contact office for sooner follow up if symptoms do not improve or worsen or seek emergency care

## 2013-11-23 NOTE — Telephone Encounter (Signed)
Called and spoke with Cherie, pt's sister.  She states that she picked up rx for pt last week and she will be bringing pt to her appt this afternoon.

## 2013-11-24 ENCOUNTER — Telehealth: Payer: Self-pay | Admitting: Internal Medicine

## 2013-11-24 ENCOUNTER — Emergency Department (HOSPITAL_COMMUNITY)
Admission: EM | Admit: 2013-11-24 | Discharge: 2013-11-24 | Disposition: A | Discharge status: Home / Self Care | Payer: Medicaid Other | Attending: Emergency Medicine | Admitting: Emergency Medicine

## 2013-11-24 ENCOUNTER — Emergency Department (HOSPITAL_COMMUNITY): Payer: Medicaid Other

## 2013-11-24 ENCOUNTER — Encounter (HOSPITAL_COMMUNITY): Payer: Self-pay | Admitting: Emergency Medicine

## 2013-11-24 DIAGNOSIS — Z87891 Personal history of nicotine dependence: Secondary | ICD-10-CM | POA: Insufficient documentation

## 2013-11-24 DIAGNOSIS — R059 Cough, unspecified: Secondary | ICD-10-CM | POA: Diagnosis not present

## 2013-11-24 DIAGNOSIS — G43909 Migraine, unspecified, not intractable, without status migrainosus: Secondary | ICD-10-CM | POA: Diagnosis not present

## 2013-11-24 DIAGNOSIS — F3289 Other specified depressive episodes: Secondary | ICD-10-CM | POA: Diagnosis not present

## 2013-11-24 DIAGNOSIS — G40909 Epilepsy, unspecified, not intractable, without status epilepticus: Secondary | ICD-10-CM | POA: Insufficient documentation

## 2013-11-24 DIAGNOSIS — Z87448 Personal history of other diseases of urinary system: Secondary | ICD-10-CM | POA: Insufficient documentation

## 2013-11-24 DIAGNOSIS — R569 Unspecified convulsions: Secondary | ICD-10-CM | POA: Diagnosis present

## 2013-11-24 DIAGNOSIS — R071 Chest pain on breathing: Secondary | ICD-10-CM | POA: Diagnosis not present

## 2013-11-24 DIAGNOSIS — Z79899 Other long term (current) drug therapy: Secondary | ICD-10-CM | POA: Diagnosis not present

## 2013-11-24 DIAGNOSIS — J45909 Unspecified asthma, uncomplicated: Secondary | ICD-10-CM | POA: Diagnosis not present

## 2013-11-24 DIAGNOSIS — Z85118 Personal history of other malignant neoplasm of bronchus and lung: Secondary | ICD-10-CM | POA: Diagnosis not present

## 2013-11-24 DIAGNOSIS — F329 Major depressive disorder, single episode, unspecified: Secondary | ICD-10-CM | POA: Diagnosis not present

## 2013-11-24 DIAGNOSIS — Z21 Asymptomatic human immunodeficiency virus [HIV] infection status: Secondary | ICD-10-CM | POA: Insufficient documentation

## 2013-11-24 DIAGNOSIS — R0789 Other chest pain: Secondary | ICD-10-CM

## 2013-11-24 DIAGNOSIS — R05 Cough: Secondary | ICD-10-CM | POA: Insufficient documentation

## 2013-11-24 MED ORDER — ALBUTEROL SULFATE (2.5 MG/3ML) 0.083% IN NEBU
5.0000 mg | INHALATION_SOLUTION | Freq: Once | RESPIRATORY_TRACT | Status: AC
  Administered 2013-11-24: 5 mg via RESPIRATORY_TRACT
  Filled 2013-11-24: qty 6

## 2013-11-24 MED ORDER — LORAZEPAM 2 MG/ML IJ SOLN
1.0000 mg | Freq: Once | INTRAMUSCULAR | Status: AC
  Administered 2013-11-24: 1 mg via INTRAVENOUS
  Filled 2013-11-24: qty 1

## 2013-11-24 MED ORDER — HYDROMORPHONE HCL PF 1 MG/ML IJ SOLN
1.0000 mg | Freq: Once | INTRAMUSCULAR | Status: AC
  Administered 2013-11-24: 1 mg via INTRAVENOUS
  Filled 2013-11-24: qty 1

## 2013-11-24 MED ORDER — IPRATROPIUM BROMIDE 0.02 % IN SOLN
0.5000 mg | Freq: Once | RESPIRATORY_TRACT | Status: AC
  Administered 2013-11-24: 0.5 mg via RESPIRATORY_TRACT
  Filled 2013-11-24: qty 2.5

## 2013-11-24 MED ORDER — OXYCODONE-ACETAMINOPHEN 5-325 MG PO TABS
2.0000 | ORAL_TABLET | Freq: Once | ORAL | Status: AC
  Administered 2013-11-24: 2 via ORAL
  Filled 2013-11-24: qty 2

## 2013-11-24 NOTE — Telephone Encounter (Signed)
Pt seen yesterday 6.23.15 by TP for 2 week follow up  Pt mentioned after ov that she would like a refill on her Percocet 5-334m - had advised pt will speak with TP regarding this  Pt's last refill of the above medication was done on 6.18.15 for #20 with no refills  Discussed with TP - unable to fill at this time.  Will need to discuss with MW when he returns to the office next week.  Thanks.  Would triage mind calling patient?  Thank you!

## 2013-11-24 NOTE — Discharge Instructions (Signed)
Chest Wall Pain Chest wall pain is pain felt in or around the chest bones and muscles. It may take up to 6 weeks to get better. It may take longer if you are active. Chest wall pain can happen on its own. Other times, things like germs, injury, coughing, or exercise can cause the pain. HOME CARE   Avoid activities that make you tired or cause pain. Try not to use your chest, belly (abdominal), or side muscles. Do not use heavy weights.  Put ice on the sore area.  Put ice in a plastic bag.  Place a towel between your skin and the bag.  Leave the ice on for 15-20 minutes for the first 2 days.  Only take medicine as told by your doctor. GET HELP RIGHT AWAY IF:   You have more pain or are very uncomfortable.  You have a fever.  Your chest pain gets worse.  You have new problems.  You feel sick to your stomach (nauseous) or throw up (vomit).  You start to sweat or feel lightheaded.  You have a cough with mucus (phlegm).  You cough up blood. MAKE SURE YOU:   Understand these instructions.  Will watch your condition.  Will get help right away if you are not doing well or get worse. Document Released: 11/06/2007 Document Revised: 08/12/2011 Document Reviewed: 01/14/2011 [facility] Patient Information 2015 [place], [place]. This information is not intended to replace advice given to you by your health care provider. Make sure you discuss any questions you have with your health care provider.

## 2013-11-24 NOTE — Telephone Encounter (Signed)
Needs appt at the pain clinic Once this is done, give her enough to last until she gets there or for 2 weeks, whichever is shorter, and if not seen at the 2 week mark ov with me with all meds in hand

## 2013-11-24 NOTE — ED Provider Notes (Addendum)
CSN: [id number]     Arrival date & time 11/24/13  2106 History   First MD Initiated Contact with Patient 11/24/13 2140     Chief Complaint  Patient presents with  . Seizures  . Cough     (Consider location/radiation/quality/duration/timing/severity/associated sxs/prior Treatment) HPI Comments: Pt has hx of pneumonectomy with chronic right-sided chest pain. States that her pain was worse today and she took a Percocet but the pain did not improve. Also having tightness with breathing and used a breathing treatment with only minimal relief. She denies fever, productive cough or other complaints.  Patient is a 44 y.o. female presenting with seizures and cough. The history is provided by the patient.  Seizures Seizure activity on arrival: no   Seizure type:  Grand mal Preceding symptoms: aura   Initial focality:  None Episode characteristics: abnormal movements, generalized shaking and unresponsiveness   Postictal symptoms: confusion   Return to baseline: yes   Severity:  Moderate Timing:  Once Progression:  Resolved Context: stress   Context: not change in medication, not fever and medical compliance   Recent head injury:  No recent head injuries PTA treatment:  None History of seizures: yes   Cough   Past Medical History  Diagnosis Date  . HIV (human immunodeficiency virus infection)     takes Stribild daily  . Substance abuse   . Asthma     uses Albuterol inhaler daily as needed  . Shortness of breath     sitting/lying/exertion  . Headache(784.0)   . History of migraine     last one about 2wks ago  . Seizures     takes Depakote daily-last one over a week ago  . Dizziness   . Dysphagia   . Urinary frequency   . Nocturia   . Depression     takes Trazodone and Seroquel nightly  . Lung cancer    Past Surgical History  Procedure Laterality Date  . Cesarean section  66yr ago  . Video bronchoscopy Bilateral 05/05/2013    Procedure: VIDEO BRONCHOSCOPY WITH FLUORO;   Surgeon: MTanda Rockers MD;  Location: [facility];  Service: Cardiopulmonary;  Laterality: Bilateral;  . Video assisted thoracoscopy (vats)/wedge resection Right 07/05/2013    Procedure: VIDEO ASSISTED THORACOSCOPY (VATS)/WEDGE RESECTION, Right Upper Lobectomy with lymph node disecction and OnQ placement;  Surgeon: SMelrose Nakayama MD;  Location: [facility];  Service: Thoracic;  Laterality: Right;  (R)VATS, WEDGE RESECTION, POSSIBLE LOBECTOMY, POSSIBLE CHEST WALL RESECTION   Family History  Problem Relation Age of Onset  . Diabetes Mother   . CAD Mother   . Hypertension Mother   . Hypertension Sister   . Lung cancer Maternal Aunt     smoked  . Allergies Sister   . Asthma Sister   . Lung cancer Maternal Uncle     never smoker  . Lung cancer Maternal Grandmother     never smoker   History  Substance Use Topics  . Smoking status: Former Smoker -- 0.25 packs/day for 24 years    Types: Cigarettes    Quit date: 11/01/2013  . Smokeless tobacco: Never Used  . Alcohol Use: No   OB History   Grav Para Term Preterm Abortions TAB SAB Ect Mult Living                 Review of Systems  Respiratory: Positive for cough.   Neurological: Positive for seizures.  All other systems reviewed and are negative.     Allergies  Gabapentin  Home Medications   Prior to Admission medications   Medication Sig Start Date End Date Taking? Authorizing Provider  albuterol (PROVENTIL) (2.5 MG/3ML) 0.083% nebulizer solution Take 6 mLs (5 mg total) by nebulization every 6 (six) hours as needed for wheezing or shortness of breath. 10/28/13  Yes Tatyana A Kirichenko, PA-C  budesonide-formoterol (SYMBICORT) 160-4.5 MCG/ACT inhaler Inhale 2 puffs into the lungs 2 (two) times daily. 10/23/13  Yes Barton Dubois, MD  divalproex (DEPAKOTE) 250 MG DR tablet Take 250-500 mg by mouth 2 (two) times daily. 250 mg in the am and 500 in the pm   Yes Historical Provider, MD   elvitegravir-cobicistat-emtricitabine-tenofovir (STRIBILD) 150-150-200-300 MG TABS tablet Take 1 tablet by mouth daily with breakfast. 11/04/13  Yes Thayer Headings, MD  Ipratropium-Albuterol (COMBIVENT) 20-100 MCG/ACT AERS respimat Inhale 1 puff into the lungs every 6 (six) hours as needed for wheezing or shortness of breath. 10/23/13  Yes Barton Dubois, MD  oxyCODONE-acetaminophen (ROXICET) 5-325 MG per tablet Take 1 tablet by mouth every 6 (six) hours as needed for severe pain. 11/18/13  Yes Tanda Rockers, MD  pantoprazole (PROTONIX) 40 MG tablet Take 1 tablet (40 mg total) by mouth daily. 11/10/13  Yes Tanda Rockers, MD  QUEtiapine (SEROQUEL) 200 MG tablet Take 200 mg by mouth at bedtime.   Yes Historical Provider, MD  traZODone (DESYREL) 150 MG tablet Take 75 mg by mouth at bedtime.   Yes Historical Provider, MD  predniSONE (DELTASONE) 20 MG tablet Take 3 tablets (60 mg total) by mouth daily. 11/15/13   Kalman Drape, MD   BP 113/78  Pulse 85  Temp(Src) 98.4 F (36.9 C) (Oral)  Resp 15  SpO2 96%  LMP 11/01/2013 Physical Exam  Nursing note and vitals reviewed. Constitutional: She is oriented to person, place, and time. She appears well-developed and well-nourished. No distress.  HENT:  Head: Normocephalic and atraumatic.  Mouth/Throat: Oropharynx is clear and moist.  Eyes: Conjunctivae and EOM are normal. Pupils are equal, round, and reactive to light.  Neck: Normal range of motion. Neck supple.  Cardiovascular: Normal rate, regular rhythm and intact distal pulses.   No murmur heard. Pulmonary/Chest: Effort normal. No respiratory distress. She has decreased breath sounds. She has no wheezes. She has no rales. She exhibits tenderness.  Abdominal: Soft. She exhibits no distension. There is no tenderness. There is no rebound and no guarding.  Musculoskeletal: Normal range of motion. She exhibits no edema and no tenderness.  Neurological: She is alert and oriented to person, place, and time.   Skin: Skin is warm and dry. No rash noted. No erythema.  Psychiatric: She has a normal mood and affect. Her behavior is normal.    ED Course  Procedures (including critical care time) Labs Review Labs Reviewed - No data to display  Imaging Review Dg Chest 2 View  11/24/2013   CLINICAL DATA:  Right-sided chest pain. Cough. Seizure. History of lung cancer.  EXAM: CHEST  2 VIEW  COMPARISON:  11/15/2013 and 07/12/2013 and chest CT dated 10/05/2013  FINDINGS: Heart size and pulmonary vascularity are normal. There is scarring in the right hemi thorax, stable. No acute infiltrates or effusions. No acute osseous abnormality.  IMPRESSION: No acute disease in the chest.   Electronically Signed   By: Rozetta Nunnery M.D.   On: 11/24/2013 21:51     EKG Interpretation   Date/Time:  Wednesday November 24 2013 22:10:10 EDT Ventricular Rate:  87 PR Interval:  127 QRS  Duration: 103 QT Interval:  386 QTC Calculation: 464 R Axis:   56 Text Interpretation:  Age not entered, assumed to be  43 years old for  purpose of ECG interpretation Sinus rhythm Normal ECG No significant  change since last tracing Confirmed by [facility]  MD, Loree Fee ([id number]) on  11/24/2013 10:30:10 PM      MDM   Final diagnoses:  Seizure  Chest wall pain   Patient with a history of seizures and COPD who was seen repeatedly in the ER for both. She was last seen last week with labs except for a Depakote level that is less than 10. Patient states she is taking her Depakote daily but has not gets strong enough. Based on having less than 10 levels every time she comes seems accurate. Patient has a followup appointment with a neurologist this month and at that time reevaluates her medications. States her seizures are brought on by stress which is what occurred today. Also she states that her breathing feels tight and with the persistent chest pain she usually has. She took one oxycodone and states the pain did not improve however she also ran  out of oxycodone today. Patient has mild decreased breath sounds but is satting 98% on room air and has her typical chronic right-sided pleuritic chest pain. Chest x-ray is unchanged. Patient given pain control and albuterol and Atrovent. At this time do not feel that she needs repeat labs as she had all her labs done within the last week Dorothy normal limits. Will have her continue her Depakote at this time until she sees the neurologist.  Patient and family are comfortable with this plan.   Blanchie Dessert, MD 11/24/13 [id number]  Blanchie Dessert, MD 11/24/13 [phone]

## 2013-11-24 NOTE — Telephone Encounter (Signed)
LMTCB

## 2013-11-24 NOTE — ED Notes (Signed)
Patient reports that previously given pain medication "helped bring the pain down some, but it's still there." Patient asking for additional pain medication--will make EDP aware

## 2013-11-24 NOTE — ED Notes (Addendum)
Patient's family reports that patient had "two back to back seizures." EMS reports that when they arrived, patient not post-ictal--alert and oriented x 4 NO loss of bowel or bladder Patient with hx of cocaine use/abuse Patient also with hx of chronic pain, chronic narcotic use--awaiting pain management approval from [facility]

## 2013-11-24 NOTE — ED Notes (Signed)
Patient transported to X-ray Patient in NAD upon leaving ED for testing

## 2013-11-24 NOTE — Telephone Encounter (Signed)
Pt aware that this will have to be discussed with MW upon his return next week. Pt expressed understanding.    Please advise Dr Melvyn Novas. Thanks.

## 2013-11-24 NOTE — ED Notes (Signed)
Patient also reports productive cough for several days--none noted during assessment Patient with hx of COPD

## 2013-11-24 NOTE — ED Notes (Signed)
Bed: ER74 Expected date:  Expected time:  Means of arrival:  Comments: EMS

## 2013-11-25 ENCOUNTER — Telehealth: Payer: Self-pay | Admitting: *Deleted

## 2013-11-25 ENCOUNTER — Telehealth: Payer: Self-pay | Admitting: Internal Medicine

## 2013-11-25 DIAGNOSIS — R918 Other nonspecific abnormal finding of lung field: Secondary | ICD-10-CM

## 2013-11-25 NOTE — Telephone Encounter (Signed)
Nurse calling from dr office (pain mgnt) calling on this pt Elizabeth [name].Elizabeth [name]

## 2013-11-25 NOTE — Telephone Encounter (Signed)
Called spoke w/ pt. Aware of MW recs. She will call back once she gets an appt with pain clinic. Nothing further needed

## 2013-11-25 NOTE — Telephone Encounter (Signed)
Called and spoke with nurse at doctors office--Jolene.  She stated that she is suggesting that MW refer the pt for the pain management  Care.    MW please advise if ok to send referral to the pain management for this pt.  Thanks  Allergies  Allergen Reactions  . Gabapentin Swelling     Current Outpatient Prescriptions on File Prior to Visit  Medication Sig Dispense Refill  . albuterol (PROVENTIL) (2.5 MG/3ML) 0.083% nebulizer solution Take 6 mLs (5 mg total) by nebulization every 6 (six) hours as needed for wheezing or shortness of breath.  75 mL  2  . budesonide-formoterol (SYMBICORT) 160-4.5 MCG/ACT inhaler Inhale 2 puffs into the lungs 2 (two) times daily.  1 Inhaler  12  . divalproex (DEPAKOTE) 250 MG DR tablet Take 250-500 mg by mouth 2 (two) times daily. 250 mg in the am and 500 in the pm      . elvitegravir-cobicistat-emtricitabine-tenofovir (STRIBILD) 150-150-200-300 MG TABS tablet Take 1 tablet by mouth daily with breakfast.  30 tablet  6  . Ipratropium-Albuterol (COMBIVENT) 20-100 MCG/ACT AERS respimat Inhale 1 puff into the lungs every 6 (six) hours as needed for wheezing or shortness of breath.  1 Inhaler  3  . oxyCODONE-acetaminophen (ROXICET) 5-325 MG per tablet Take 1 tablet by mouth every 6 (six) hours as needed for severe pain.  20 tablet  0  . pantoprazole (PROTONIX) 40 MG tablet Take 1 tablet (40 mg total) by mouth daily.  30 tablet  1  . predniSONE (DELTASONE) 20 MG tablet Take 3 tablets (60 mg total) by mouth daily.  15 tablet  0  . QUEtiapine (SEROQUEL) 200 MG tablet Take 200 mg by mouth at bedtime.      . traZODone (DESYREL) 150 MG tablet Take 75 mg by mouth at bedtime.       No current facility-administered medications on file prior to visit.

## 2013-11-25 NOTE — Telephone Encounter (Signed)
Elizabeth [name] has called requesting a referral to a  pain management clinic.  She said Dr. Melvyn Novas said our off ice should do it since we did the surgery.  At first I agreed. After reviewing Dr. Leonarda Salon last office visit note which brought to attention the fact that she had had the chest and back discomfort before she was referred to him for her lung mass, I felt Dr. Gustavus Bryant office should be referring her. He had prescribed her pain meds prior to her seeing Dr. Roxan Hockey. Dr. Roxan Hockey had stopped giving her pain meds.  I called and relayed this decision to his staff. I will call and inform [name] also.

## 2013-11-25 NOTE — Telephone Encounter (Signed)
Pt returned call [phone]

## 2013-11-26 NOTE — Telephone Encounter (Signed)
Yes, refer her to pain clinic

## 2013-11-26 NOTE — Telephone Encounter (Signed)
Referral has been placed.  Pt would like to be contacted with an ASAP APPT. Will forward to PCC's. Thanks

## 2013-11-29 ENCOUNTER — Telehealth: Payer: Self-pay | Admitting: Internal Medicine

## 2013-11-29 NOTE — Telephone Encounter (Signed)
Called and spoke with pt and she stated that she has been waiting to get an appt with the pain clinic.  She stated that she still has not heard anything from them about an appt.  It looks like in the computer this referral has been faxed over to the pain clinic but they are scheduling out into august and September.  Pt stated that she is out of pain meds and is about to go crazy and wanting to know if MW  Can write something for her.  MW please advise. Thanks  Allergies  Allergen Reactions  . Gabapentin Swelling     Current Outpatient Prescriptions on File Prior to Visit  Medication Sig Dispense Refill  . albuterol (PROVENTIL) (2.5 MG/3ML) 0.083% nebulizer solution Take 6 mLs (5 mg total) by nebulization every 6 (six) hours as needed for wheezing or shortness of breath.  75 mL  2  . budesonide-formoterol (SYMBICORT) 160-4.5 MCG/ACT inhaler Inhale 2 puffs into the lungs 2 (two) times daily.  1 Inhaler  12  . divalproex (DEPAKOTE) 250 MG DR tablet Take 250-500 mg by mouth 2 (two) times daily. 250 mg in the am and 500 in the pm      . elvitegravir-cobicistat-emtricitabine-tenofovir (STRIBILD) 150-150-200-300 MG TABS tablet Take 1 tablet by mouth daily with breakfast.  30 tablet  6  . Ipratropium-Albuterol (COMBIVENT) 20-100 MCG/ACT AERS respimat Inhale 1 puff into the lungs every 6 (six) hours as needed for wheezing or shortness of breath.  1 Inhaler  3  . oxyCODONE-acetaminophen (ROXICET) 5-325 MG per tablet Take 1 tablet by mouth every 6 (six) hours as needed for severe pain.  20 tablet  0  . pantoprazole (PROTONIX) 40 MG tablet Take 1 tablet (40 mg total) by mouth daily.  30 tablet  1  . predniSONE (DELTASONE) 20 MG tablet Take 3 tablets (60 mg total) by mouth daily.  15 tablet  0  . QUEtiapine (SEROQUEL) 200 MG tablet Take 200 mg by mouth at bedtime.      . traZODone (DESYREL) 150 MG tablet Take 75 mg by mouth at bedtime.       No current facility-administered medications on file prior to  visit.

## 2013-11-29 NOTE — Telephone Encounter (Signed)
Called, spoke with pt.  Informed her of below per MW.  She verbalized understanding.   Offered pt appt tomorrow morning with MW.  Pt states she will need appt after 1 pm d/t transportation. We have scheduled pt to see MW tomorrow, June 30 at 2:30 pm -- pt aware and is to bring ALL active meds including OTC with her to [place].

## 2013-11-29 NOTE — Telephone Encounter (Signed)
Ok to add on for tomorrow but must bring all active meds and otcs with her - nothing to offer today

## 2013-11-30 ENCOUNTER — Ambulatory Visit (INDEPENDENT_AMBULATORY_CARE_PROVIDER_SITE_OTHER): Payer: Self-pay | Admitting: Internal Medicine

## 2013-11-30 ENCOUNTER — Encounter: Payer: Self-pay | Admitting: Internal Medicine

## 2013-11-30 VITALS — BP 124/80 | HR 94 | Temp 98.4°F | Ht 61.0 in | Wt 138.8 lb

## 2013-11-30 DIAGNOSIS — G8912 Acute post-thoracotomy pain: Secondary | ICD-10-CM

## 2013-11-30 DIAGNOSIS — J449 Chronic obstructive pulmonary disease, unspecified: Secondary | ICD-10-CM

## 2013-11-30 MED ORDER — OXYCODONE-ACETAMINOPHEN 5-325 MG PO TABS: 1.0000 | ORAL_TABLET | Freq: Three times a day (TID) | ORAL | Status: DC | PRN

## 2013-11-30 NOTE — Progress Notes (Signed)
Subjective:    Patient ID: Elizabeth [name], female    DOB: 07-03-1970  MRN: [id number]   Brief patient profile:  89 yowf HIV since 2008 actively smoking referred to pulmonary clinic 04/16/2013 by Dr comer with sob and cough and RUL density with plans for excisional bx 07/05/13.    History of Present Illness  04/16/2013 1st [facility] office visit/ Wert  Chief Complaint  Patient presents with  . Pulmonary Consult    Referred per Dr. Gara Kroner for eval of abn CT Chest. The pt c/o CP and dyspnea x 5 months. She states that she gets out of breath just walking from one room to the next and wakes up gasping for air. She also c/o prod cough with minimal yellow sputum x 5 months.    cp anterior x onset early Oct 2014 , worse with cough. Sputum never bloody  zpak helped some  saba helps doe some  Poor dentition and having active Seizures since July 2013 (denies ever seeing neuorlogist as outpt) rec The key is to stop smoking completely before smoking completely stops you!  Augmentin 875 mg take one pill twice daily  X 10 days       04/30/2013 f/u ov/Wert still smoking  re: RUL density  Chief Complaint  Patient presents with  . Follow-up    Pt c/o SOB constantly, waking her up at night.  Pt went to ED Saturday for seizures. Pt feels worse now than at her lv 2wks ago.   Hurt worse since ran out of pain meds but was needing to up to 2 every 4 hours until 3 days prior to OV  And no better p augmentin. Mucus remains dark but not bloody. Since ov had multiple sz's but not seeing in neurologist Doe and at rest not better p saba Pain / sob constant now 24/7 and generalized anterior on R, localized on R post below scapula tip rec Fob > done 05/05/13 with hemoptysis immediately p procedure   05/08/2013 acute  ov/Wert re: hemoptysis/ gagging from coughing  Chief Complaint  Patient presents with  . Acute Visit    pt c/o nausea, cannot eat anything. Pt also c/o coughing up blood since  procedure. I advised the pt that this is normal, but triage advised her to come in for OV.    no sob at rest Amt of brb is tapering off to less than a tsp on day of ov - total amt about a quarter cup mixed with saliva. rec Keep taking the hydrocodone up to 2 every 6 hours for pain or cough For nausea phenergan  12.5 mg 1-2 every 6 hours as needed  Soups, broth based first, then add noodles and crackers  Last = veggies/ salads/ dairy products We will arrange for PET scan next > c/w ? Limited  ca so referred for T surg    06/29/2013 f/u ov/Wert re: pain control  Chief Complaint  Patient presents with  . Follow-up    Pt here to discuss pain management- Dr Roxan Hockey advised he would not prescribe anything until after her surgery 07/05/13.     Percocet 2 every 6 = 8 per day = 60 needed until surgery (delayed by surg and eval at [facility]) Still smoking  rec Ok to continue to take the percocet up to 2 every 6 hours if needed to control the pain Keep all follow up appts with Dr Roxan Hockey who will order your post op pain meds and refer you either  back here or to the appropriate specialist for out patient follow up once we know the diagnsosis The key is to stop smoking completely before smoking completely stops you- this must be done now to reduce your risk of pulmonary complications from surgery     T surgery   wedge 07/05/13  POS [facility] Lung Ca T2 N0 Mx   10/08/2013 acute ov/Wert re: post op CP/ hemoptysis/ still smoking Chief Complaint  Patient presents with  . Acute Visit    Pt c/o hemoptysis x 2 wks, and still having pain in her rt side. Her breathing has been worse since her lung surgery. She states using albuterol inhaler at least 3 times per day.   the pain on R is completely different that preop post cp and assoc with area of burning numbness along rib distribution and 6th ICS on right all the way anteriorly to midline  Hemoptysis is only streaky with no more than a tsp or two per day total,  no purulent sputum and no epistaxis rec Trazadone 200 mg take one half every night perfectly regularly and if you still can't tolerate the side effect we need to try you on the 50 mg strength and build up  Percocet every 4 hours as needed for cough or pain  The key is to stop smoking completely before smoking completely stops you!  Zpak zostrix cream 4 x daily along the distribution of the pain (over the counter)    Admit date: 10/20/2013  Discharge date: 10/23/2013   Discharge Diagnoses:  Acute resp failure  COPD exacerbation  HIV INFECTION  BREAST MASS, LEFT  Pulmonary mass s/p resection  Post-thoracotomy pain syndrome  Depression  Tobacco abuse  GERD   . 11/10/2013 post hosp f/u ov/Wert re: copdIII/ quit smoking 11/01/13 - cw pain better/ completing abx and pred  Chief Complaint  Patient presents with  . HFU    Pt states quit smoking x 10 days ago. Breathing is some better but c/o prod cough with minimal yellow sputum x 3 days.  She is using combivent at least 3 x per day.   comfortable at rest, doe 32 ft/ congested cough symbicort 160  empty Neb albuterol/not using combivent first   11/23/13 Follow up  Returns for  2 week COPD follow up Reports breathing is somewhat improved but still having DOE, wheezing, prod cough with yellow mucus-but much less.  No fever, hemoptysis, chest pain, orthopnea or edema.  Has quit smoking .  Last ov encouraged to use Symbicort . Previous PFT showed moderate COPD w/ FEV 48%.  Says she is taking w/out missed doses.  rec Continue on current regimen.  Great job on not smoking -keep up good work .  Follow up Dr. Melvyn Novas  In 6-8 weeks and As needed     11/30/2013 f/u ov/Wert re: AB / last albuterol > 1 week, off symbicort  Chief Complaint  Patient presents with  . Follow-up    Pt c/o "pain in lungs"- unchanged since last visit. She has been referred to pain management clinic but has no appt pending yet.   no increase saba since ran out of  symbicort Not keeping up with refills on pysch meds. Pain is most severe beneath R Breast and not pleuritic but continuous, distribution along Same rib where had vats and not the same as the prev post cp.  Only took zostix tid. Only med that helps is percocet. Says not smoking   No obvious day to day  or daytime variabilty or assoc chronic cough or   chest tightness, subjective wheeze overt sinus or hb symptoms. No unusual exp hx or h/o childhood pna/ asthma or knowledge of premature birth.  Sleeping ok without nocturnal  or early am exacerbation  of respiratory  c/o's or need for noct saba. Also denies any obvious fluctuation of symptoms with weather or environmental changes or other aggravating or alleviating factors except as outlined above   Current Medications, Allergies, Complete Past Medical History, Past Surgical History, Family History, and Social History were reviewed in [employer] record.  ROS  The following are not active complaints unless bolded sore throat, dysphagia, dental problems, itching, sneezing,  nasal congestion or excess/ purulent secretions, ear ache,   fever, chills, sweats, unintended wt loss, pleuritic or exertional cp, hemoptysis,  orthopnea pnd or leg swelling, presyncope, palpitations, heartburn, abdominal pain, anorexia, nausea, vomiting, diarrhea  or change in bowel or urinary habits, change in stools or urine, dysuria,hematuria,  rash, arthralgias, visual complaints, headache, numbness weakness or ataxia or problems with walking or coordination,  change in mood/affect or memory.                              Objective:   Physical Exam   amb wf ? Belle affect  06/29/2013      149 > 10/09/2013 137> 11/10/2013  136 >138 11/23/13 > 11/30/2013  139        HEENT mild turbinate edema.  Oropharynx no thrush or excess pnd or cobblestoning.  No JVD or cervical adenopathy. Mild accessory muscle hypertrophy. Trachea midline, nl thryroid.  Chest was lslt hyperinflated by percussion with diminished breath sounds but no wheeze at all . Hoover sign positive at mid inspiration. Regular rate and rhythm without murmur gallop or rub or increase P2 or edema.  Abd: no hsm, nl excursion. Ext warm without cyanosis or clubbing.  No rash or cw tenderness   CTa  10/05/13 Post op changes only     Assessment & Plan:

## 2013-11-30 NOTE — Patient Instructions (Addendum)
For pain use percocet 5-325 every 8 if needed and let us know the date of your visit to pain clinic and we will provide you with refills until this date if you do the following:  Start back on zosrix 4 x daily on the area of pain only  Only use your albuterol (ventolin)  as a rescue medication to be used if you can't catch your breath by resting or doing a relaxed purse lip breathing pattern.  - The less you use it, the better it will work when you need it. - Ok to use up to 2 puffs  every 4 hours if you must but call for immediate appointment if use goes up over your usual need - Don't leave home without it !!  (think of it like the spare tire for your car)   Only use the albuterol neb every 4 hours if ventolin fails to relieve your breathing problems  Need to have enough of your psych meds to last between visits  See Tammy NP w/in 2 weeks with a list of approved meds on your drug plan plus  all your medications, even over the counter meds, separated in two separate bags, the ones you take no matter what vs the ones you stop once you feel better and take only as needed when you feel you need them.   Tammy  will generate for you a new user friendly medication calendar that will put [place] all on the same page re: your medication use.     Without this process, it simply isn't possible to assure that we are providing  your outpatient care  with  the attention to detail we feel you deserve.   If we cannot assure that you're getting that kind of care,  then we cannot manage your problem effectively from this clinic.  Once you have seen Tammy and we are sure that we're all on the same page with your medication use she will arrange follow up with me.

## 2013-11-30 NOTE — Assessment & Plan Note (Addendum)
Recent flare now resolved  Encouraged on smoking cessation  Steroid talk regarding possible side effects   Plan  Continue on current regimen.  Great job on not smoking -keep up good work .  Follow up Dr. Melvyn Novas  In 6-8 weeks and As needed   Please contact office for sooner follow up if symptoms do not improve or worsen or seek emergency care

## 2013-11-30 NOTE — Progress Notes (Signed)
Subjective:    Patient ID: Elizabeth [name], female    DOB: [birth year]  MRN: [id number]   Brief patient profile:  62 yowf HIV since 2008 actively smoking referred to pulmonary clinic 04/16/2013 by Dr comer with sob and cough and RUL density with plans for excisional bx 07/05/13.    History of Present Illness  04/16/2013 1st [facility] office visit/ Wert  Chief Complaint  Patient presents with  . Pulmonary Consult    Referred per Dr. Gara Kroner for eval of abn CT Chest. The pt c/o CP and dyspnea x 5 months. She states that she gets out of breath just walking from one room to the next and wakes up gasping for air. She also c/o prod cough with minimal yellow sputum x 5 months.    cp anterior x onset early Oct 2014 , worse with cough. Sputum never bloody  zpak helped some  saba helps doe some  Poor dentition and having active Seizures since July 2013 (denies ever seeing neuorlogist as outpt) rec The key is to stop smoking completely before smoking completely stops you!  Augmentin 875 mg take one pill twice daily  X 10 days       04/30/2013 f/u ov/Wert still smoking  re: RUL density  Chief Complaint  Patient presents with  . Follow-up    Pt c/o SOB constantly, waking her up at night.  Pt went to ED Saturday for seizures. Pt feels worse now than at her lv 2wks ago.   Hurt worse since ran out of pain meds but was needing to up to 2 every 4 hours until 3 days prior to OV  And no better p augmentin. Mucus remains dark but not bloody. Since ov had multiple sz's but not seeing in neurologist Doe and at rest not better p saba Pain / sob constant now 24/7 and generalized anterior on R, localized on R post below scapula tip rec Fob > done 05/05/13 with hemoptysis immediately p procedure   05/08/2013 acute  ov/Wert re: hemoptysis/ gagging from coughing  Chief Complaint  Patient presents with  . Acute Visit    pt c/o nausea, cannot eat anything. Pt also c/o coughing up blood since  procedure. I advised the pt that this is normal, but triage advised her to come in for OV.    no sob at rest Amt of brb is tapering off to less than a tsp on day of ov - total amt about a quarter cup mixed with saliva. rec Keep taking the hydrocodone up to 2 every 6 hours for pain or cough For nausea phenergan  12.5 mg 1-2 every 6 hours as needed  Soups, broth based first, then add noodles and crackers  Last = veggies/ salads/ dairy products We will arrange for PET scan next > c/w ? Limited  ca so referred for T surg    06/29/2013 f/u ov/Wert re: pain control  Chief Complaint  Patient presents with  . Follow-up    Pt here to discuss pain management- Dr Roxan Hockey advised he would not prescribe anything until after her surgery 07/05/13.     Percocet 2 every 6 = 8 per day = 60 needed until surgery (delayed by surg and eval at [facility]) Still smoking  rec Ok to continue to take the percocet up to 2 every 6 hours if needed to control the pain Keep all follow up appts with Dr Roxan Hockey who will order your post op pain meds and refer you either  back here or to the appropriate specialist for out patient follow up once we know the diagnsosis The key is to stop smoking completely before smoking completely stops you- this must be done now to reduce your risk of pulmonary complications from surgery     T surgery   wedge 07/05/13  POS [facility] Lung Ca T2 N0 Mx   10/08/2013 acute ov/Wert re: post op CP/ hemoptysis/ still smoking Chief Complaint  Patient presents with  . Acute Visit    Pt c/o hemoptysis x 2 wks, and still having pain in her rt side. Her breathing has been worse since her lung surgery. She states using albuterol inhaler at least 3 times per day.   the pain on R is completely different that preop post cp and assoc with area of burning numbness along rib distribution and 6th ICS on right all the way anteriorly to midline  Hemoptysis is only streaky with no more than a tsp or two per day total,  no purulent sputum and no epistaxis rec Trazadone 200 mg take one half every night perfectly regularly and if you still can't tolerate the side effect we need to try you on the 50 mg strength and build up  Percocet every 4 hours as needed for cough or pain  The key is to stop smoking completely before smoking completely stops you!  Zpak zostrix cream 4 x daily along the distribution of the pain (over the counter)    Admit date: 10/20/2013  Discharge date: 10/23/2013   Discharge Diagnoses:  Acute resp failure  COPD exacerbation  HIV INFECTION  BREAST MASS, LEFT  Pulmonary mass s/p resection  Post-thoracotomy pain syndrome  Depression  Tobacco abuse  GERD   . 11/10/2013 post hosp f/u ov/Wert re: copdIII/ quit smoking 11/01/13 - cw pain better/ completing abx and pred  Chief Complaint  Patient presents with  . HFU    Pt states quit smoking x 10 days ago. Breathing is some better but c/o prod cough with minimal yellow sputum x 3 days.  She is using combivent at least 3 x per day.   comfortable at rest, doe 74 ft/ congested cough symbicort 160  empty Neb albuterol/not using combivent first   11/23/13 Follow up  Returns for  2 week COPD follow up Reports breathing is somewhat improved but still having DOE, wheezing, prod cough with yellow mucus-but much less.  No fever, hemoptysis, chest pain, orthopnea or edema.  Has quit smoking .  Last ov encouraged to use Symbicort . Previous PFT showed moderate COPD w/ FEV 48%.  Says she is taking w/out missed doses.      Current Medications, Allergies, Complete Past Medical History, Past Surgical History, Family History, and Social History were reviewed in [employer] record.  ROS  The following are not active complaints unless bolded sore throat, dysphagia, dental problems, itching, sneezing,  nasal congestion or excess/ purulent secretions, ear ache,   fever, chills, sweats, unintended wt loss, pleuritic or  exertional cp,orthopnea pnd or leg swelling, presyncope, palpitations, heartburn, abdominal pain, anorexia, nausea, vomiting, diarrhea  or change in bowel or urinary habits, change in stools or urine, dysuria,hematuria,  rash, arthralgias, visual complaints, headache, numbness weakness or ataxia or problems with walking or coordination,  change in mood/affect or memory.                  Objective:   Physical Exam   Anxious wf nad  .06/29/2013  149 > 10/09/2013 137> 11/10/2013  136 >138 11/23/13        HEENT mild turbinate edema.  Oropharynx no thrush or excess pnd or cobblestoning.  No JVD or cervical adenopathy. Mild accessory muscle hypertrophy. Trachea midline, nl thryroid. Chest was hyperinflated by percussion with diminished breath sounds . Hoover sign positive at mid inspiration. Regular rate and rhythm without murmur gallop or rub or increase P2 or edema.  Abd: no hsm, nl excursion. Ext warm without cyanosis or clubbing.  No rash or cw tenderness   CTa  10/05/13 Post op changes only     Assessment & Plan:

## 2013-12-01 NOTE — Assessment & Plan Note (Signed)
-   reports quit smoking 11/01/13  - 11/10/2013 p extensive coaching HFA effectiveness =    75% - off symbicort as of 11/21/13 s flare > rec leave off as many of her copd symptoms appear to have resolved off cigs and just use saba prn for now   .

## 2013-12-01 NOTE — Assessment & Plan Note (Signed)
S/p R VATS 07/05/2013  - Try trazadone 75 mg daily plus zostrix 10/08/13 > not taking either as of 11/30/13> rec rechallenge   Misunderstanding with Dr Farley Ly office re nature of this pain but doesn't really matter as best rx is zostriz/pain clinic and only prn use of narcotics, and only until establishes with pain clinic  I had an extended discussion with the patient today lasting 15 to 20 minutes of a 25 minute visit on the following issues: We need to trust but verify that she's taking all meds correctly/ consistently before offering further refills on narcotics   To keep things simple, I have asked the patient to first separate medicines that are perceived as maintenance, that is to be taken daily "no matter what", from those medicines that are taken on only on an as-needed basis and I have given the patient examples of both, and then return to see our NP to generate a  detailed  medication calendar which should be followed until the next physician sees the patient and updates it.

## 2013-12-02 ENCOUNTER — Encounter: Payer: Self-pay | Admitting: Internal Medicine

## 2013-12-14 ENCOUNTER — Encounter: Payer: Self-pay | Admitting: Adult Health

## 2014-01-07 ENCOUNTER — Ambulatory Visit: Payer: Self-pay | Admitting: Internal Medicine

## 2014-01-16 ENCOUNTER — Emergency Department (HOSPITAL_COMMUNITY)
Admission: EM | Admit: 2014-01-16 | Discharge: 2014-01-17 | Disposition: A | Discharge status: Home / Self Care | Payer: Medicaid Other | Attending: Emergency Medicine | Admitting: Emergency Medicine

## 2014-01-16 ENCOUNTER — Emergency Department (HOSPITAL_COMMUNITY): Payer: Medicaid Other

## 2014-01-16 DIAGNOSIS — Z720 Tobacco use: Secondary | ICD-10-CM

## 2014-01-16 DIAGNOSIS — Z79899 Other long term (current) drug therapy: Secondary | ICD-10-CM | POA: Insufficient documentation

## 2014-01-16 DIAGNOSIS — Z21 Asymptomatic human immunodeficiency virus [HIV] infection status: Secondary | ICD-10-CM | POA: Insufficient documentation

## 2014-01-16 DIAGNOSIS — R569 Unspecified convulsions: Secondary | ICD-10-CM

## 2014-01-16 DIAGNOSIS — F17200 Nicotine dependence, unspecified, uncomplicated: Secondary | ICD-10-CM | POA: Insufficient documentation

## 2014-01-16 DIAGNOSIS — J209 Acute bronchitis, unspecified: Secondary | ICD-10-CM | POA: Insufficient documentation

## 2014-01-16 DIAGNOSIS — J441 Chronic obstructive pulmonary disease with (acute) exacerbation: Secondary | ICD-10-CM

## 2014-01-16 DIAGNOSIS — J440 Chronic obstructive pulmonary disease with acute lower respiratory infection: Secondary | ICD-10-CM | POA: Diagnosis not present

## 2014-01-16 DIAGNOSIS — Z87891 Personal history of nicotine dependence: Secondary | ICD-10-CM | POA: Insufficient documentation

## 2014-01-16 DIAGNOSIS — G40909 Epilepsy, unspecified, not intractable, without status epilepticus: Secondary | ICD-10-CM

## 2014-01-16 LAB — CBC WITH DIFFERENTIAL/PLATELET
BASOS PCT: 0 % (ref 0–1)
Basophils Absolute: 0 10*3/uL (ref 0.0–0.1)
Eosinophils Absolute: 0.2 10*3/uL (ref 0.0–0.7)
Eosinophils Relative: 3 % (ref 0–5)
HCT: 34.5 % — ABNORMAL LOW (ref 36.0–46.0)
HEMOGLOBIN: 12.2 g/dL (ref 12.0–15.0)
Lymphocytes Relative: 35 % (ref 12–46)
Lymphs Abs: 2.4 10*3/uL (ref 0.7–4.0)
MCH: 29.5 pg (ref 26.0–34.0)
MCHC: 35.4 g/dL (ref 30.0–36.0)
MCV: 83.5 fL (ref 78.0–100.0)
Monocytes Absolute: 0.4 10*3/uL (ref 0.1–1.0)
Monocytes Relative: 7 % (ref 3–12)
NEUTROS ABS: 3.7 10*3/uL (ref 1.7–7.7)
NEUTROS PCT: 55 % (ref 43–77)
Platelets: 277 10*3/uL (ref 150–400)
RBC: 4.13 MIL/uL (ref 3.87–5.11)
RDW: 13.9 % (ref 11.5–15.5)
WBC: 6.7 10*3/uL (ref 4.0–10.5)

## 2014-01-16 MED ORDER — PREDNISONE 20 MG PO TABS
60.0000 mg | ORAL_TABLET | Freq: Once | ORAL | Status: AC
  Administered 2014-01-17: 60 mg via ORAL
  Filled 2014-01-16: qty 3

## 2014-01-16 MED ORDER — IPRATROPIUM-ALBUTEROL 0.5-2.5 (3) MG/3ML IN SOLN
3.0000 mL | Freq: Once | RESPIRATORY_TRACT | Status: AC
  Administered 2014-01-17: 3 mL via RESPIRATORY_TRACT
  Filled 2014-01-16: qty 3

## 2014-01-16 MED ORDER — HYDROCOD POLST-CHLORPHEN POLST 10-8 MG/5ML PO LQCR
5.0000 mL | Freq: Once | ORAL | Status: AC
  Administered 2014-01-17: 5 mL via ORAL
  Filled 2014-01-16: qty 5

## 2014-01-16 NOTE — ED Notes (Signed)
EMS called to home.  Patient family states that the patient has been having seizures all day. EMS states that the patient has not shown any signs of post ictal when with them.   EMS notes that the patient does have expiratory wheezing.

## 2014-01-16 NOTE — ED Notes (Signed)
Patient transported to X-ray 

## 2014-01-17 LAB — BASIC METABOLIC PANEL
ANION GAP: 10 (ref 5–15)
BUN: 11 mg/dL (ref 6–23)
CHLORIDE: 101 meq/L (ref 96–112)
CO2: 24 mEq/L (ref 19–32)
Calcium: 8.9 mg/dL (ref 8.4–10.5)
Creatinine, Ser: 0.58 mg/dL (ref 0.50–1.10)
GFR calc non Af Amer: >90 mL/min (ref >90)
Glucose, Bld: 111 mg/dL — ABNORMAL HIGH (ref 70–99)
POTASSIUM: 3.4 meq/L — AB (ref 3.7–5.3)
Sodium: 135 mEq/L — ABNORMAL LOW (ref 137–147)

## 2014-01-17 LAB — VALPROIC ACID LEVEL

## 2014-01-17 MED ORDER — AZITHROMYCIN 250 MG PO TABS
500.0000 mg | ORAL_TABLET | Freq: Once | ORAL | Status: AC
  Administered 2014-01-17: 500 mg via ORAL
  Filled 2014-01-17: qty 2

## 2014-01-17 MED ORDER — HYDROCOD POLST-CHLORPHEN POLST 10-8 MG/5ML PO LQCR: 5.0000 mL | Freq: Two times a day (BID) | ORAL | Status: DC | PRN

## 2014-01-17 MED ORDER — PREDNISONE 20 MG PO TABS
60.0000 mg | ORAL_TABLET | Freq: Every day | ORAL | Status: DC
Start: 2014-01-17 — End: 2014-09-08

## 2014-01-17 MED ORDER — AZITHROMYCIN 250 MG PO TABS: 250.0000 mg | ORAL_TABLET | Freq: Every day | ORAL | Status: DC

## 2014-01-17 MED ORDER — DIVALPROEX SODIUM 500 MG PO DR TAB
1000.0000 mg | DELAYED_RELEASE_TABLET | Freq: Once | ORAL | Status: AC
  Administered 2014-01-17: 1000 mg via ORAL
  Filled 2014-01-17: qty 2

## 2014-01-17 MED ORDER — BENZONATATE 100 MG PO CAPS: 100.0000 mg | ORAL_CAPSULE | Freq: Three times a day (TID) | ORAL | Status: DC

## 2014-01-17 NOTE — ED Notes (Signed)
Pt requesting pain medication. MD made aware. Pt informed that she was given pain medication with her previous medications.

## 2014-01-17 NOTE — ED Provider Notes (Signed)
CSN: [id number]     Arrival date & time 01/16/14  2246 History   First MD Initiated Contact with Patient 01/16/14 2315     Chief Complaint  Patient presents with  . Seizures     (Consider location/radiation/quality/duration/timing/severity/associated sxs/prior Treatment) HPI 43 year old female presents to emergency department from home via EMS with complaint of multiple seizures, shortness of breath cough and wheezing, chest wall pain from coughing.  Patient has history of HIV, substance abuse, COPD, seizures.  Patient takes Depakote for seizures.  She reports that she is compliant with all of her medications.  She reports that she is out of her Dilaudid and Percocet.  Patient reports that she started back to smoking 3 days ago due to increased stress.  She reports 3 days of increasing shortness of breath and cough.  She reports that she is using her albuterol inhaler and nebulizer without improvement.  She denies any fever chills.  No change in her sputum. Past Surgical History  Procedure Laterality Date  . Cesarean section  80yr ago  . Video bronchoscopy Bilateral 05/05/2013    Procedure: VIDEO BRONCHOSCOPY WITH FLUORO;  Surgeon: MTanda Rockers MD;  Location: [facility];  Service: Cardiopulmonary;  Laterality: Bilateral;  . Video assisted thoracoscopy (vats)/wedge resection Right 07/05/2013    Procedure: VIDEO ASSISTED THORACOSCOPY (VATS)/WEDGE RESECTION, Right Upper Lobectomy with lymph node disecction and OnQ placement;  Surgeon: SMelrose Nakayama MD;  Location: [facility] OR;  Service: Thoracic;  Laterality: Right;  (R)VATS, WEDGE RESECTION, POSSIBLE LOBECTOMY, POSSIBLE CHEST WALL RESECTION   Family History  Problem Relation Age of Onset  . Diabetes Mother   . CAD Mother   . Hypertension Mother   . Hypertension Sister   . Lung cancer Maternal Aunt     smoked  . Allergies Sister   . Asthma Sister   . Lung cancer Maternal Uncle     never smoker  . Lung cancer Maternal Grandmother    never smoker   History  Substance Use Topics  . Smoking status: Former Smoker -- 0.25 packs/day for 24 years    Types: Cigarettes    Quit date: 11/01/2013  . Smokeless tobacco: Never Used  . Alcohol Use: No   OB History   Grav Para Term Preterm Abortions TAB SAB Ect Mult Living                 Review of Systems   See History of Present Illness; otherwise all other systems are reviewed and negative  Allergies  Gabapentin and Tramadol  Home Medications   Prior to Admission medications   Medication Sig Start Date End Date Taking? Authorizing Provider  albuterol (PROVENTIL HFA;VENTOLIN HFA) 108 (90 BASE) MCG/ACT inhaler Inhale 2 puffs into the lungs every 6 (six) hours as needed for wheezing or shortness of breath.   Yes Historical Provider, MD  albuterol (PROVENTIL) (2.5 MG/3ML) 0.083% nebulizer solution Take 6 mLs (5 mg total) by nebulization every 6 (six) hours as needed for wheezing or shortness of breath. 10/28/13  Yes Tatyana A Kirichenko, PA-C  budesonide-formoterol (SYMBICORT) 160-4.5 MCG/ACT inhaler Inhale 2 puffs into the lungs 2 (two) times daily.   Yes Historical Provider, MD  divalproex (DEPAKOTE) 250 MG DR tablet Take 250-500 mg by mouth 2 (two) times daily. 250 mg in the am and 500 in the pm   Yes Historical Provider, MD  elvitegravir-cobicistat-emtricitabine-tenofovir (STRIBILD) 150-150-200-300 MG TABS tablet Take 1 tablet by mouth daily with breakfast. 11/04/13  Yes RThayer Headings MD  Ipratropium-Albuterol (COMBIVENT) 20-100 MCG/ACT AERS respimat Inhale 1 puff into the lungs every 6 (six) hours as needed for wheezing or shortness of breath. 10/23/13  Yes Barton Dubois, MD  oxyCODONE-acetaminophen (ROXICET) 5-325 MG per tablet Take 1 tablet by mouth every 8 (eight) hours as needed for severe pain. 11/30/13  Yes Tanda Rockers, MD  traZODone (DESYREL) 150 MG tablet Take 150 mg by mouth at bedtime.    Yes Historical Provider, MD   BP 120/91  Pulse 111  Temp(Src) 98.6 F (37  C) (Oral)  SpO2 98%  LMP 01/16/2014 Physical Exam  Nursing note and vitals reviewed. Constitutional: She is oriented to person, place, and time. She appears well-developed and well-nourished. She appears distressed.  HENT:  Head: Normocephalic and atraumatic.  Right Ear: External ear normal.  Left Ear: External ear normal.  Nose: Nose normal.  Mouth/Throat: Oropharynx is clear and moist.  Eyes: Conjunctivae and EOM are normal. Pupils are equal, round, and reactive to light.  Neck: Normal range of motion. Neck supple. No JVD present. No tracheal deviation present. No thyromegaly present.  Cardiovascular: Normal rate, regular rhythm, normal heart sounds and intact distal pulses.  Exam reveals no gallop and no friction rub.   No murmur heard. Pulmonary/Chest: Effort normal. No stridor. No respiratory distress. She has wheezes. She has no rales. She exhibits no tenderness.  Coarse breath sounds throughout, patient coughing  Abdominal: Soft. Bowel sounds are normal. She exhibits no distension and no mass. There is no tenderness. There is no rebound and no guarding.  Musculoskeletal: Normal range of motion. She exhibits no edema and no tenderness.  Lymphadenopathy:    She has no cervical adenopathy.  Neurological: She is alert and oriented to person, place, and time. No cranial nerve deficit. She exhibits normal muscle tone. Coordination normal.  Skin: Skin is warm and dry. No rash noted. No erythema. No pallor.  Psychiatric: She has a normal mood and affect. Her behavior is normal. Judgment and thought content normal.    ED Course  Procedures (including critical care time) Labs Review Labs Reviewed  CBC WITH DIFFERENTIAL - Abnormal; Notable for the following:    HCT 34.5 (*)    All other components within normal limits  BASIC METABOLIC PANEL - Abnormal; Notable for the following:    Sodium 135 (*)    Potassium 3.4 (*)    Glucose, Bld 111 (*)    All other components within normal  limits  VALPROIC ACID LEVEL - Abnormal; Notable for the following:    Valproic Acid Lvl <10.0 (*)    All other components within normal limits    Imaging Review Dg Chest 2 View  01/17/2014   CLINICAL DATA:  Cough and shortness of breath. History of lung cancer.  EXAM: CHEST  2 VIEW  COMPARISON:  11/24/2013  FINDINGS: The cardiac silhouette, mediastinal and hilar contours are normal and stable. Stable surgical and radiation changes involving the right lung. No acute overlying pulmonary process. No worrisome pulmonary lesions. No pleural effusion. The bony thorax is intact.  IMPRESSION: Stable surgical and radiation changes involving the right lung without definite findings for recurrent tumor or metastatic pulmonary disease. No acute overlying pulmonary findings.   Electronically Signed   By: Kalman Jewels M.D.   On: 01/17/2014 00:07     EKG Interpretation None      MDM   Final diagnoses:  COPD exacerbation  Tobacco abuse  Seizure disorder  Seizure secondary to subtherapeutic anticonvulsant medication  43 year old female with seizure disorder, found to have subtherapeutic for progress levels.  She reports that she has followup with her neurologist in 2 weeks.  We'll give her 1 g of valproic acid here today.  Patient lungs much improved after neb treatment.  Patient strongly encouraged to quit smoking.  She is to followup with her pulmonologist.      Kalman Drape, MD 01/17/14 [phone]

## 2014-01-17 NOTE — Discharge Instructions (Signed)
Please try to quit smoking to help with your ongoing lung disease!  Take medications as prescribed.  Follow up with your pulmonology doctor this week for recheck.  Discuss with your neurologist/seizure doctor about your problems keeping your Depakote levels up.   Chronic Obstructive Pulmonary Disease Exacerbation Chronic obstructive pulmonary disease (COPD) is a common lung condition in which airflow from the lungs is limited. COPD is a general term that can be used to describe many different lung problems that limit airflow, including chronic bronchitis and emphysema. COPD exacerbations are episodes when breathing symptoms become much worse and require extra treatment. Without treatment, COPD exacerbations can be life threatening, and frequent COPD exacerbations can cause further damage to your lungs. CAUSES   Respiratory infections.   Exposure to smoke.   Exposure to air pollution, chemical fumes, or dust. Sometimes there is no apparent cause or trigger. RISK FACTORS  Smoking cigarettes.  Older age.  Frequent prior COPD exacerbations. SIGNS AND SYMPTOMS   Increased coughing.   Increased thick spit (sputum) production.   Increased wheezing.   Increased shortness of breath.   Rapid breathing.   Chest tightness. DIAGNOSIS  Your medical history, a physical exam, and tests will help your health care provider make a diagnosis. Tests may include:  A chest X-ray.  Basic lab tests.  Sputum testing.  An arterial blood gas test. TREATMENT  Depending on the severity of your COPD exacerbation, you may need to be admitted to a hospital for treatment. Some of the treatments commonly used to treat COPD exacerbations are:   Antibiotic medicines.   Bronchodilators. These are drugs that expand the air passages. They may be given with an inhaler or nebulizer. Spacer devices may be needed to help improve drug delivery.  Corticosteroid medicines.  Supplemental oxygen therapy.   HOME CARE INSTRUCTIONS   Do not smoke. Quitting smoking is very important to prevent COPD from getting worse and exacerbations from happening as often.  Avoid exposure to all substances that irritate the airway, especially to tobacco smoke.   If you were prescribed an antibiotic medicine, finish it all even if you start to feel better.  Take all medicines as directed by your health care provider.It is important to use correct technique with inhaled medicines.  Drink enough fluids to keep your urine clear or pale yellow (unless you have a medical condition that requires fluid restriction).  Use a cool mist vaporizer. This makes it easier to clear your chest when you cough.   If you have a home nebulizer and oxygen, continue to use them as directed.   Maintain all necessary vaccinations to prevent infections.   Exercise regularly.   Eat a healthy diet.   Keep all follow-up appointments as directed by your health care provider. SEEK IMMEDIATE MEDICAL CARE IF:  You have worsening shortness of breath.   You have trouble talking.   You have severe chest pain.  You have blood in your sputum.  You have a fever.  You have weakness, vomit repeatedly, or faint.   You feel confused.   You continue to get worse. MAKE SURE YOU:   Understand these instructions.  Will watch your condition.  Will get help right away if you are not doing well or get worse. Document Released: 03/17/2007 Document Revised: 10/04/2013 Document Reviewed: 01/22/2013 [facility] Patient Information 2015 [place], [place]. This information is not intended to replace advice given to you by your health care provider. Make sure you discuss any questions you have with  your health care provider.  Epilepsy Epilepsy is a disorder in which a person has repeated seizures over time. A seizure is a release of abnormal electrical activity in the brain. Seizures can cause a change in attention, behavior, or the  ability to remain awake and alert (altered mental status). Seizures often involve uncontrollable shaking (convulsions).  Most people with epilepsy lead normal lives. However, people with epilepsy are at an increased risk of falls, accidents, and injuries. Therefore, it is important to begin treatment right away. CAUSES  Epilepsy has many possible causes. Anything that disturbs the normal pattern of brain cell activity can lead to seizures. This may include:   Head injury.  Birth trauma.  High fever as a child.  Stroke.  Bleeding into or around the brain.  Certain drugs.  Prolonged low oxygen, such as what occurs after CPR efforts.  Abnormal brain development.  Certain illnesses, such as meningitis, encephalitis (brain infection), malaria, and other infections.  An imbalance of nerve signaling chemicals (neurotransmitters).  SIGNS AND SYMPTOMS  The symptoms of a seizure can vary greatly from one person to another. Right before a seizure, you may have a warning (aura) that a seizure is about to occur. An aura may include the following symptoms:  Fear or anxiety.  Nausea.  Feeling like the room is spinning (vertigo).  Vision changes, such as seeing flashing lights or spots. Common symptoms during a seizure include:  Abnormal sensations, such as an abnormal smell or a bitter taste in the mouth.   Sudden, general body stiffness.   Convulsions that involve rhythmic jerking of the face, arm, or leg on one or both sides.   Sudden change in consciousness.   Appearing to be awake but not responding.   Appearing to be asleep but cannot be awakened.   Grimacing, chewing, lip smacking, drooling, tongue biting, or loss of bowel or bladder control. After a seizure, you may feel sleepy for a while. DIAGNOSIS  Your health care provider will ask about your symptoms and take a medical history. Descriptions from any witnesses to your seizures will be very helpful in the  diagnosis. A physical exam, including a detailed neurological exam, is necessary. Various tests may be done, such as:   An electroencephalogram (EEG). This is a painless test of your brain waves. In this test, a diagram is created of your brain waves. These diagrams can be interpreted by a specialist.  An MRI of the brain.   A CT scan of the brain.   A spinal tap (lumbar puncture, LP).  Blood tests to check for signs of infection or abnormal blood chemistry. TREATMENT  There is no cure for epilepsy, but it is generally treatable. Once epilepsy is diagnosed, it is important to begin treatment as soon as possible. For most people with epilepsy, seizures can be controlled with medicines. The following may also be used:  A pacemaker for the brain (vagus nerve stimulator) can be used for people with seizures that are not well controlled by medicine.  Surgery on the brain. For some people, epilepsy eventually goes away. HOME CARE INSTRUCTIONS   Follow your health care provider's recommendations on driving and safety in normal activities.  Get enough rest. Lack of sleep can cause seizures.  Only take over-the-counter or prescription medicines as directed by your health care provider. Take any prescribed medicine exactly as directed.  Avoid any known triggers of your seizures.  Keep a seizure diary. Record what you recall about any seizure, especially  any possible trigger.   Make sure the people you live and work with know that you are prone to seizures. They should receive instructions on how to help you. In general, a witness to a seizure should:   Cushion your head and body.   Turn you on your side.   Avoid unnecessarily restraining you.   Not place anything inside your mouth.   Call for emergency medical help if there is any question about what has occurred.   Follow up with your health care provider as directed. You may need regular blood tests to monitor the levels of  your medicine.  SEEK MEDICAL CARE IF:   You develop signs of infection or other illness. This might increase the risk of a seizure.   You seem to be having more frequent seizures.   Your seizure pattern is changing.  SEEK IMMEDIATE MEDICAL CARE IF:   You have a seizure that does not stop after a few moments.   You have a seizure that causes any difficulty in breathing.   You have a seizure that results in a very severe headache.   You have a seizure that leaves you with the inability to speak or use a part of your body.  Document Released: 05/20/2005 Document Revised: 03/10/2013 Document Reviewed: 12/30/2012 [facility] Patient Information 2015 [place], [place]. This information is not intended to replace advice given to you by your health care provider. Make sure you discuss any questions you have with your health care provider.  Smoking Cessation, Tips for Success If you are ready to quit smoking, congratulations! You have chosen to help yourself be healthier. Cigarettes bring nicotine, tar, carbon monoxide, and other irritants into your body. Your lungs, heart, and blood vessels will be able to work better without these poisons. There are many different ways to quit smoking. Nicotine gum, nicotine patches, a nicotine inhaler, or nicotine nasal spray can help with physical craving. Hypnosis, support groups, and medicines help break the habit of smoking. WHAT THINGS CAN I DO TO MAKE QUITTING EASIER?  Here are some tips to help you quit for good:  Pick a date when you will quit smoking completely. Tell all of your friends and family about your plan to quit on that date.  Do not try to slowly cut down on the number of cigarettes you are smoking. Pick a quit date and quit smoking completely starting on that day.  Throw away all cigarettes.   Clean and remove all ashtrays from your home, work, and car.  On a card, write down your reasons for quitting. Carry the card with you and  read it when you get the urge to smoke.  Cleanse your body of nicotine. Drink enough water and fluids to keep your urine clear or pale yellow. Do this after quitting to flush the nicotine from your body.  Learn to predict your moods. Do not let a bad situation be your excuse to have a cigarette. Some situations in your life might tempt you into wanting a cigarette.  Never have "just one" cigarette. It leads to wanting another and another. Remind yourself of your decision to quit.  Change habits associated with smoking. If you smoked while driving or when feeling stressed, try other activities to replace smoking. Stand up when drinking your coffee. Brush your teeth after eating. Sit in a different chair when you read the paper. Avoid alcohol while trying to quit, and try to drink fewer caffeinated beverages. Alcohol and caffeine may urge  you to smoke.  Avoid foods and drinks that can trigger a desire to smoke, such as sugary or spicy foods and alcohol.  Ask people who smoke not to smoke around you.  Have something planned to do right after eating or having a cup of coffee. For example, plan to take a walk or exercise.  Try a relaxation exercise to calm you down and decrease your stress. Remember, you may be tense and nervous for the first 2 weeks after you quit, but this will pass.  Find new activities to keep your hands busy. Play with a pen, coin, or rubber band. Doodle or draw things on paper.  Brush your teeth right after eating. This will help cut down on the craving for the taste of tobacco after meals. You can also try mouthwash.   Use oral substitutes in place of cigarettes. Try using lemon drops, carrots, cinnamon sticks, or chewing gum. Keep them handy so they are available when you have the urge to smoke.  When you have the urge to smoke, try deep breathing.  Designate your home as a nonsmoking area.  If you are a heavy smoker, ask your health care provider about a prescription  for nicotine chewing gum. It can ease your withdrawal from nicotine.  Reward yourself. Set aside the cigarette money you save and buy yourself something nice.  Look for support from others. Join a support group or smoking cessation program. Ask someone at home or at work to help you with your plan to quit smoking.  Always ask yourself, "Do I need this cigarette or is this just a reflex?" Tell yourself, "Today, I choose not to smoke," or "I do not want to smoke." You are reminding yourself of your decision to quit.  Do not replace cigarette smoking with electronic cigarettes (commonly called e-cigarettes). The safety of e-cigarettes is unknown, and some may contain harmful chemicals.  If you relapse, do not give up! Plan ahead and think about what you will do the next time you get the urge to smoke. HOW WILL I FEEL WHEN I QUIT SMOKING? You may have symptoms of withdrawal because your body is used to nicotine (the addictive substance in cigarettes). You may crave cigarettes, be irritable, feel very hungry, cough often, get headaches, or have difficulty concentrating. The withdrawal symptoms are only temporary. They are strongest when you first quit but will go away within 10-14 days. When withdrawal symptoms occur, stay in control. Think about your reasons for quitting. Remind yourself that these are signs that your body is healing and getting used to being without cigarettes. Remember that withdrawal symptoms are easier to treat than the major diseases that smoking can cause.  Even after the withdrawal is over, expect periodic urges to smoke. However, these cravings are generally short lived and will go away whether you smoke or not. Do not smoke! WHAT RESOURCES ARE AVAILABLE TO HELP ME QUIT SMOKING? Your health care provider can direct you to community resources or hospitals for support, which may include:  Group support.  Education.  Hypnosis.  Therapy. Document Released: 02/16/2004 Document  Revised: 10/04/2013 Document Reviewed: 11/05/2012 [facility] Patient Information 2015 [place], [place]. This information is not intended to replace advice given to you by your health care provider. Make sure you discuss any questions you have with your health care provider.  Smoking Cessation Quitting smoking is important to your health and has many advantages. However, it is not always easy to quit since nicotine is a very  addictive drug. Oftentimes, people try 3 times or more before being able to quit. This document explains the best ways for you to prepare to quit smoking. Quitting takes hard work and a lot of effort, but you can do it. ADVANTAGES OF QUITTING SMOKING  You will live longer, feel better, and live better.  Your body will feel the impact of quitting smoking almost immediately.  Within 20 minutes, blood pressure decreases. Your pulse returns to its normal level.  After 8 hours, carbon monoxide levels in the blood return to normal. Your oxygen level increases.  After 24 hours, the chance of having a heart attack starts to decrease. Your breath, hair, and body stop smelling like smoke.  After 48 hours, damaged nerve endings begin to recover. Your sense of taste and smell improve.  After 72 hours, the body is virtually free of nicotine. Your bronchial tubes relax and breathing becomes easier.  After 2 to 12 weeks, lungs can hold more air. Exercise becomes easier and circulation improves.  The risk of having a heart attack, stroke, cancer, or lung disease is greatly reduced.  After 1 year, the risk of coronary heart disease is cut in half.  After 5 years, the risk of stroke falls to the same as a nonsmoker.  After 10 years, the risk of lung cancer is cut in half and the risk of other cancers decreases significantly.  After 15 years, the risk of coronary heart disease drops, usually to the level of a nonsmoker.  If you are pregnant, quitting smoking will improve your chances  of having a healthy baby.  The people you live with, especially any children, will be healthier.  You will have extra money to spend on things other than cigarettes. QUESTIONS TO THINK ABOUT BEFORE ATTEMPTING TO QUIT You may want to talk about your answers with your health care provider.  Why do you want to quit?  If you tried to quit in the past, what helped and what did not?  What will be the most difficult situations for you after you quit? How will you plan to handle them?  Who can help you through the tough times? Your family? Friends? A health care provider?  What pleasures do you get from smoking? What ways can you still get pleasure if you quit? Here are some questions to ask your health care provider:  How can you help me to be successful at quitting?  What medicine do you think would be best for me and how should I take it?  What should I do if I need more help?  What is smoking withdrawal like? How can I get information on withdrawal? GET READY  Set a quit date.  Change your environment by getting rid of all cigarettes, ashtrays, matches, and lighters in your home, car, or work. Do not let people smoke in your home.  Review your past attempts to quit. Think about what worked and what did not. GET SUPPORT AND ENCOURAGEMENT You have a better chance of being successful if you have help. You can get support in many ways.  Tell your family, friends, and coworkers that you are going to quit and need their support. Ask them not to smoke around you.  Get individual, group, or telephone counseling and support. Programs are available at [employer] and health centers. Call your local health department for information about programs in your area.  Spiritual beliefs and practices may help some smokers quit.  Download a "quit meter"  on your computer to keep track of quit statistics, such as how long you have gone without smoking, cigarettes not smoked, and money  saved.  Get a self-help book about quitting smoking and staying off tobacco. Republic yourself from urges to smoke. Talk to someone, go for a walk, or occupy your time with a task.  Change your normal routine. Take a different route to work. Drink tea instead of coffee. Eat breakfast in a different place.  Reduce your stress. Take a hot bath, exercise, or read a book.  Plan something enjoyable to do every day. Reward yourself for not smoking.  Explore interactive web-based programs that specialize in helping you quit. GET MEDICINE AND USE IT CORRECTLY Medicines can help you stop smoking and decrease the urge to smoke. Combining medicine with the above behavioral methods and support can greatly increase your chances of successfully quitting smoking.  Nicotine replacement therapy helps deliver nicotine to your body without the negative effects and risks of smoking. Nicotine replacement therapy includes nicotine gum, lozenges, inhalers, nasal sprays, and skin patches. Some may be available over-the-counter and others require a prescription.  Antidepressant medicine helps people abstain from smoking, but how this works is unknown. This medicine is available by prescription.  Nicotinic receptor partial agonist medicine simulates the effect of nicotine in your brain. This medicine is available by prescription. Ask your health care provider for advice about which medicines to use and how to use them based on your health history. Your health care provider will tell you what side effects to look out for if you choose to be on a medicine or therapy. Carefully read the information on the package. Do not use any other product containing nicotine while using a nicotine replacement product.  RELAPSE OR DIFFICULT SITUATIONS Most relapses occur within the first 3 months after quitting. Do not be discouraged if you start smoking again. Remember, most people try several times  before finally quitting. You may have symptoms of withdrawal because your body is used to nicotine. You may crave cigarettes, be irritable, feel very hungry, cough often, get headaches, or have difficulty concentrating. The withdrawal symptoms are only temporary. They are strongest when you first quit, but they will go away within 10-14 days. To reduce the chances of relapse, try to:  Avoid drinking alcohol. Drinking lowers your chances of successfully quitting.  Reduce the amount of caffeine you consume. Once you quit smoking, the amount of caffeine in your body increases and can give you symptoms, such as a rapid heartbeat, sweating, and anxiety.  Avoid smokers because they can make you want to smoke.  Do not let weight gain distract you. Many smokers will gain weight when they quit, usually less than 10 pounds. Eat a healthy diet and stay active. You can always lose the weight gained after you quit.  Find ways to improve your mood other than smoking. FOR MORE INFORMATION  www.smokefree.gov  Document Released: 05/14/2001 Document Revised: 10/04/2013 Document Reviewed: 08/29/2011 [facility] Patient Information 2015 [place], [place]. This information is not intended to replace advice given to you by your health care provider. Make sure you discuss any questions you have with your health care provider.

## 2014-01-17 NOTE — ED Notes (Signed)
Patient is alert and oriented x3.  She was given DC instructions and follow up visit instructions.  Patient gave verbal understanding. She was DC ambulatory under her own power to home.  V/S stable.  He was not showing any signs of distress on DC 

## 2014-01-18 ENCOUNTER — Encounter: Payer: Self-pay | Admitting: Internal Medicine

## 2014-01-18 ENCOUNTER — Ambulatory Visit (INDEPENDENT_AMBULATORY_CARE_PROVIDER_SITE_OTHER): Payer: Self-pay | Admitting: Internal Medicine

## 2014-01-18 VITALS — BP 128/88 | HR 104

## 2014-01-18 DIAGNOSIS — J449 Chronic obstructive pulmonary disease, unspecified: Secondary | ICD-10-CM

## 2014-01-18 DIAGNOSIS — J4489 Other specified chronic obstructive pulmonary disease: Secondary | ICD-10-CM

## 2014-01-18 DIAGNOSIS — G8912 Acute post-thoracotomy pain: Secondary | ICD-10-CM

## 2014-01-18 MED ORDER — BENZONATATE 100 MG PO CAPS: ORAL_CAPSULE | ORAL | Status: DC

## 2014-01-18 NOTE — Patient Instructions (Addendum)
Symbicort 160 Take 2 puffs first thing in am and then another 2 puffs about 12 hours later.   Only use your albuterol neb as your rescue medication to be used if you can't catch your breath by resting or doing a relaxed purse lip breathing pattern.  - The less you use it, the better it will work when you need it. - Ok to use up to   every 4 hours if you must   The most important aspect of your care is avoiding all cigarette smoke exposure!  Best way to treat the pain is zostrix cream applied 4 x daily just to the area of pain    Have the case manager refer you to our community [facility] and follow up here with Tammy as needed

## 2014-01-18 NOTE — Assessment & Plan Note (Signed)
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%) ratio 60 and no sign resp to B2 with dlco 64 corrects to 79%  - reports quit smoking 11/01/13, 12/2013, and 01/13/14(last entry per  ER notes) - off symbicort as of 11/21/13 s flare > rec leave off > resume 01/18/2014 for active wheeze off all rx  01/18/2014 p extensive coaching HFA effectiveness =    75%   DDX of  difficult airways management all start with A and  include Adherence, Ace Inhibitors, Acid Reflux, Active Sinus Disease, Alpha 1 Antitripsin deficiency, Anxiety masquerading as Airways dz,  ABPA,  allergy(esp in young), Aspiration (esp in elderly), Adverse effects of DPI,  Active smokers, plus two Bs  = Bronchiectasis and Beta blocker use..and one C= CHF  Adherence is always the initial "prime suspect" and is a multilayered concern that requires a "trust but verify" approach in every patient - starting with knowing how to use medications, especially inhalers, correctly, keeping up with refills and understanding the fundamental difference between maintenance and prns vs those medications only taken for a very short course and then stopped and not refilled.  The proper method of use, as well as anticipated side effects, of a metered-dose inhaler are discussed and demonstrated to the patient. Improved effectiveness after extensive coaching during this visit to a level of approximately  75% so continue symbicort 16 2 bid samples given and back up with saba neb q4h prn  ? Active smoking > denies, reinforced

## 2014-01-18 NOTE — Progress Notes (Signed)
Subjective:    Patient ID: Elizabeth [name], female    DOB: [birth year]  MRN: [id number]   Brief patient profile:  76 yowf HIV since 2008 actively smoking referred to pulmonary clinic 04/16/2013 by Dr comer with sob and cough and RUL density with plans for excisional bx 07/05/13.    History of Present Illness  04/16/2013 1st [facility] office visit/ Elizabeth [name]  Chief Complaint  Patient presents with  . Pulmonary Consult    Referred per Dr. Gara Kroner for eval of abn CT Chest. The pt c/o CP and dyspnea x 5 months. She states that she gets out of breath just walking from one room to the next and wakes up gasping for air. She also c/o prod cough with minimal yellow sputum x 5 months.    cp anterior x onset early Oct 2014 , worse with cough. Sputum never bloody  zpak helped some  saba helps doe some  Poor dentition and having active Seizures since July 2013 (denies ever seeing neuorlogist as outpt) rec The key is to stop smoking completely before smoking completely stops you!  Augmentin 875 mg take one pill twice daily  X 10 days       04/30/2013 f/u ov/Elizabeth [name] still smoking  re: RUL density  Chief Complaint  Patient presents with  . Follow-up    Pt c/o SOB constantly, waking her up at night.  Pt went to ED Saturday for seizures. Pt feels worse now than at her lv 2wks ago.   Hurt worse since ran out of pain meds but was needing to up to 2 every 4 hours until 3 days prior to OV  And no better p augmentin. Mucus remains dark but not bloody. Since ov had multiple sz's but not seeing in neurologist Doe and at rest not better p saba Pain / sob constant now 24/7 and generalized anterior on R, localized on R post below scapula tip rec Fob > done 05/05/13 with hemoptysis immediately p procedure   05/08/2013 acute  ov/Elizabeth [name] re: hemoptysis/ gagging from coughing  Chief Complaint  Patient presents with  . Acute Visit    pt c/o nausea, cannot eat anything. Pt also c/o coughing up blood since  procedure. I advised the pt that this is normal, but triage advised her to come in for OV.    no sob at rest Amt of brb is tapering off to less than a tsp on day of ov - total amt about a quarter cup mixed with saliva. rec Keep taking the hydrocodone up to 2 every 6 hours for pain or cough For nausea phenergan  12.5 mg 1-2 every 6 hours as needed  Soups, broth based first, then add noodles and crackers  Last = veggies/ salads/ dairy products We will arrange for PET scan next > c/w ? Limited  ca so referred for T surg    06/29/2013 f/u ov/Elizabeth [name] re: pain control  Chief Complaint  Patient presents with  . Follow-up    Pt here to discuss pain management- Dr Roxan Hockey advised he would not prescribe anything until after her surgery 07/05/13.     Percocet 2 every 6 = 8 per day = 60 needed until surgery (delayed by surg and eval at [facility]) Still smoking  rec Ok to continue to take the percocet up to 2 every 6 hours if needed to control the pain Keep all follow up appts with Dr Roxan Hockey who will order your post op pain meds and refer you either  back here or to the appropriate specialist for out patient follow up once we know the diagnsosis The key is to stop smoking completely before smoking completely stops you- this must be done now to reduce your risk of pulmonary complications from surgery     T surgery   wedge 07/05/13  POS [facility] Lung Ca T2 N0 Mx   10/08/2013 acute ov/Elizabeth [name] re: post op CP/ hemoptysis/ still smoking Chief Complaint  Patient presents with  . Acute Visit    Pt c/o hemoptysis x 2 wks, and still having pain in her rt side. Her breathing has been worse since her lung surgery. She states using albuterol inhaler at least 3 times per day.   the pain on R is completely different that preop post cp and assoc with area of burning numbness along rib distribution and 6th ICS on right all the way anteriorly to midline  Hemoptysis is only streaky with no more than a tsp or two per day total,  no purulent sputum and no epistaxis rec Trazadone 200 mg take one half every night perfectly regularly and if you still can't tolerate the side effect we need to try you on the 50 mg strength and build up  Percocet every 4 hours as needed for cough or pain  The key is to stop smoking completely before smoking completely stops you!  Zpak zostrix cream 4 x daily along the distribution of the pain (over the counter)    Admit date: 10/20/2013  Discharge date: 10/23/2013   Discharge Diagnoses:  Acute resp failure  COPD exacerbation  HIV INFECTION  BREAST MASS, LEFT  Pulmonary mass s/p resection  Post-thoracotomy pain syndrome  Depression  Tobacco abuse  GERD   . 11/10/2013 post hosp f/u ov/Elizabeth [name] re: copdIII/ quit smoking 11/01/13 - cw pain better/ completing abx and pred  Chief Complaint  Patient presents with  . HFU    Pt states quit smoking x 10 days ago. Breathing is some better but c/o prod cough with minimal yellow sputum x 3 days.  She is using combivent at least 3 x per day.   comfortable at rest, doe 18 ft/ congested cough symbicort 160  empty Neb albuterol/not using combivent first   11/23/13 Follow up  Returns for  2 week COPD follow up Reports breathing is somewhat improved but still having DOE, wheezing, prod cough with yellow mucus-but much less.  No fever, hemoptysis, chest pain, orthopnea or edema.  Has quit smoking .  Last ov encouraged to use Symbicort . Previous PFT showed moderate COPD w/ FEV 48%.  Says she is taking w/out missed doses.  rec Continue on current regimen.  Great job on not smoking -keep up good work .  Follow up Dr. Melvyn Novas  In 6-8 weeks and As needed     11/30/2013 f/u ov/Elizabeth [name] re: AB / last albuterol > 1 week, off symbicort  Chief Complaint  Patient presents with  . Follow-up    Pt c/o "pain in lungs"- unchanged since last visit. She has been referred to pain management clinic but has no appt pending yet.   no increase saba since ran out of  symbicort Not keeping up with refills on pysch meds. Pain is most severe beneath R Breast and not pleuritic but continuous, distribution along Same rib where had vats and not the same as the prev post cp.  Only took zostix tid. Only med that helps is percocet. Says not smoking rec For pain use percocet 5-325 every  8 if needed and let us know the date of your visit to pain clinic and we will provide you with refills until this date if you do the following: Start back on zosrix 4 x daily on the area of pain only Only use your albuterol  Only use the albuterol neb every 4 hours if ventolin fails to relieve your breathing problems Need to have enough of your psych meds to last between visits    01/18/2014 f/u ov/Elizabeth [name] re: chronic pain/ AB/ denies smoking but smells of cigs  Chief Complaint  Patient presents with  . Follow-up    Pt c/o increased cough with pain in back (right lung) with SOB. Pt states that she is no longer smoking as of July 1.   cp has migrated to its 3rd location since 1st ov now more inferior/post lateral, no longer under R breast "the zostrix didn't workComptroller of symbicort/ saba hfa. Has neb saba but hasn't used it yet   No obvious day to day or daytime variabilty or assoc purulent/ bloody sputum  chest tightness, subjective wheeze overt sinus or hb symptoms. No unusual exp hx or h/o childhood pna/ asthma or knowledge of premature birth.  Sleeping ok without nocturnal  or early am exacerbation  of respiratory  c/o's or need for noct saba. Also denies any obvious fluctuation of symptoms with weather or environmental changes or other aggravating or alleviating factors except as outlined above   Current Medications, Allergies, Complete Past Medical History, Past Surgical History, Family History, and Social History were reviewed in [employer] record.  ROS  The following are not active complaints unless bolded sore throat, dysphagia, dental problems,  itching, sneezing,  nasal congestion or excess/ purulent secretions, ear ache,   fever, chills, sweats, unintended wt loss, pleuritic or exertional cp, hemoptysis,  orthopnea pnd or leg swelling, presyncope, palpitations, heartburn, abdominal pain, anorexia, nausea, vomiting, diarrhea  or change in bowel or urinary habits, change in stools or urine, dysuria,hematuria,  rash, arthralgias, visual complaints, headache, numbness weakness or ataxia or problems with walking or coordination,  change in mood/affect or memory.                              Objective:   Physical Exam   amb wf ? Belle affect  06/29/2013      149 > 10/09/2013 137> 11/10/2013  136 >138 11/23/13 > 11/30/2013  139 > 01/18/2014 139        HEENT mild turbinate edema.  Oropharynx no thrush or excess pnd or cobblestoning.  No JVD or cervical adenopathy. Mild accessory muscle hypertrophy. Trachea midline, nl thryroid. Chest was lslt hyperinflated by percussion with diminished breath sounds mid exp wheeze bilaterally . Hoover sign positive at mid inspiration. Regular rate and rhythm without murmur gallop or rub or increase P2 or edema.  Abd: no hsm, nl excursion. Ext warm without cyanosis or clubbing.  No rash or cw tenderness   CTa  10/05/13 Post op changes only     Assessment & Plan:

## 2014-01-18 NOTE — Assessment & Plan Note (Signed)
S/p R VATS 07/05/2013  - Try trazadone 75 mg daily plus zostrix 10/08/13 > not taking either as of 11/30/13> rec rechallenge  - 12/02/2013 notified pain clinic not accepting medicaid pts   Strongly doubt true post thoracotomy pain or tumor related pain as shifts around at each ov.  Best option is zostrix, denied further narcotics through this office.

## 2014-02-15 ENCOUNTER — Other Ambulatory Visit: Payer: Self-pay | Admitting: *Deleted

## 2014-02-15 DIAGNOSIS — C3411 Malignant neoplasm of upper lobe, right bronchus or lung: Secondary | ICD-10-CM

## 2014-03-02 ENCOUNTER — Encounter: Payer: Self-pay | Admitting: *Deleted

## 2014-03-02 ENCOUNTER — Telehealth: Payer: Self-pay | Admitting: *Deleted

## 2014-03-02 NOTE — Telephone Encounter (Signed)
Pt needing MD follow labs and visit.  Will send the pt a letter.

## 2014-03-15 ENCOUNTER — Inpatient Hospital Stay: Admission: RE | Admit: 2014-03-15 | Payer: Self-pay | Source: Ambulatory Visit

## 2014-03-15 ENCOUNTER — Ambulatory Visit: Payer: Self-pay | Admitting: Thoracic Surgery (Cardiothoracic Vascular Surgery)

## 2014-03-16 ENCOUNTER — Telehealth: Payer: Self-pay | Admitting: *Deleted

## 2014-03-23 ENCOUNTER — Ambulatory Visit: Payer: Self-pay | Admitting: Internal Medicine

## 2014-06-27 ENCOUNTER — Telehealth: Payer: Self-pay | Admitting: Internal Medicine

## 2014-06-27 NOTE — Telephone Encounter (Signed)
atc daughter back at number provided, prompted "please enter an extension and press pound to connect with your lead" then line went dead.   atc patient at both numbers listed, both numbers not in service.    wcb

## 2014-06-28 ENCOUNTER — Telehealth: Payer: Self-pay | Admitting: Internal Medicine

## 2014-06-28 DIAGNOSIS — J449 Chronic obstructive pulmonary disease, unspecified: Secondary | ICD-10-CM

## 2014-06-28 NOTE — Telephone Encounter (Signed)
Attempted all numbers we have available for pt and number provided for pt's daughter and none are in working order. Will try back later.

## 2014-06-28 NOTE — Telephone Encounter (Signed)
Spoke with pt and she states the neb machine she was using belonged to her sister and she is no longer staying with her sister.  She needs a machine of her own.  Order placed.

## 2014-06-29 NOTE — Telephone Encounter (Signed)
Unable to get through to number listed in message, asking me to "please enter an extension and press pound to connect with your lead" ATC numbers listed on chart, none are working. Will try back later.

## 2014-06-30 ENCOUNTER — Telehealth: Payer: Self-pay | Admitting: Internal Medicine

## 2014-06-30 DIAGNOSIS — R918 Other nonspecific abnormal finding of lung field: Secondary | ICD-10-CM

## 2014-06-30 NOTE — Telephone Encounter (Signed)
Called made daughter aware referral placed.

## 2014-06-30 NOTE — Telephone Encounter (Signed)
Fine with me

## 2014-06-30 NOTE — Telephone Encounter (Signed)
Unable to reach pt or her daughter will #'s provided. We have tried several times. Will close message at this time.

## 2014-06-30 NOTE — Telephone Encounter (Signed)
Called spoke with pt daughter Sherlynn Carbon. She is wanting [place] to refer pt to [facility] [place] and needs a referral from [place] since pt is on medicaid. Please advise MW thanks

## 2014-07-04 NOTE — Telephone Encounter (Signed)
In the process of making referral to [facility] [place], no copy of patient  medicaid card, they will need pt ins card to complete this referral. Spoke to pt daughter Elizabeth [name], who says pt just got medicaid card, she will have pt bring card to office, we will need a copy. Elizabeth [name]

## 2014-07-27 ENCOUNTER — Other Ambulatory Visit (HOSPITAL_COMMUNITY)
Admission: RE | Admit: 2014-07-27 | Discharge: 2014-07-27 | Disposition: A | Discharge status: Home / Self Care | Payer: Medicaid Other | Source: Ambulatory Visit | Attending: Internal Medicine | Admitting: Internal Medicine

## 2014-07-27 ENCOUNTER — Other Ambulatory Visit: Payer: Medicaid Other

## 2014-07-27 ENCOUNTER — Telehealth: Payer: Self-pay | Admitting: *Deleted

## 2014-07-27 DIAGNOSIS — Z113 Encounter for screening for infections with a predominantly sexual mode of transmission: Secondary | ICD-10-CM | POA: Diagnosis not present

## 2014-07-27 DIAGNOSIS — Z79899 Other long term (current) drug therapy: Secondary | ICD-10-CM

## 2014-07-27 DIAGNOSIS — B20 Human immunodeficiency virus [HIV] disease: Secondary | ICD-10-CM

## 2014-07-27 LAB — COMPLETE METABOLIC PANEL WITH GFR
ALT: 10 U/L (ref 0–35)
AST: 14 U/L (ref 0–37)
Albumin: 4.1 g/dL (ref 3.5–5.2)
Alkaline Phosphatase: 68 U/L (ref 39–117)
BILIRUBIN TOTAL: 0.3 mg/dL (ref 0.2–1.2)
BUN: 14 mg/dL (ref 6–23)
CO2: 27 mEq/L (ref 19–32)
Calcium: 9.2 mg/dL (ref 8.4–10.5)
Chloride: 102 mEq/L (ref 96–112)
Creat: 0.67 mg/dL (ref 0.50–1.10)
GFR, Est African American: >89 mL/min
GLUCOSE: 80 mg/dL (ref 70–99)
Potassium: 4.6 mEq/L (ref 3.5–5.3)
Sodium: 135 mEq/L (ref 135–145)
Total Protein: 7.3 g/dL (ref 6.0–8.3)

## 2014-07-27 LAB — CBC WITH DIFFERENTIAL/PLATELET
BASOS ABS: 0 10*3/uL (ref 0.0–0.1)
BASOS PCT: 0 % (ref 0–1)
Eosinophils Absolute: 0.1 10*3/uL (ref 0.0–0.7)
Eosinophils Relative: 2 % (ref 0–5)
HCT: 39.9 % (ref 36.0–46.0)
Hemoglobin: 13.3 g/dL (ref 12.0–15.0)
LYMPHS PCT: 32 % (ref 12–46)
Lymphs Abs: 2.3 10*3/uL (ref 0.7–4.0)
MCH: 28.4 pg (ref 26.0–34.0)
MCHC: 33.3 g/dL (ref 30.0–36.0)
MCV: 85.1 fL (ref 78.0–100.0)
MONOS PCT: 8 % (ref 3–12)
MPV: 10.1 fL (ref 8.6–12.4)
Monocytes Absolute: 0.6 10*3/uL (ref 0.1–1.0)
NEUTROS ABS: 4.2 10*3/uL (ref 1.7–7.7)
NEUTROS PCT: 58 % (ref 43–77)
PLATELETS: 330 10*3/uL (ref 150–400)
RBC: 4.69 MIL/uL (ref 3.87–5.11)
RDW: 15.2 % (ref 11.5–15.5)
WBC: 7.2 10*3/uL (ref 4.0–10.5)

## 2014-07-27 LAB — LIPID PANEL
Cholesterol: 175 mg/dL (ref 0–200)
HDL: 41 mg/dL — ABNORMAL LOW (ref >=46)
LDL CALC: 107 mg/dL — AB (ref 0–99)
Total CHOL/HDL Ratio: 4.3 Ratio
Triglycerides: 135 mg/dL (ref <150)
VLDL: 27 mg/dL (ref 0–40)

## 2014-07-27 LAB — RPR

## 2014-07-27 MED ORDER — ELVITEG-COBIC-EMTRICIT-TENOFDF 150-150-200-300 MG PO TABS: 1.0000 | ORAL_TABLET | Freq: Every day | ORAL | Status: DC

## 2014-07-27 NOTE — Telephone Encounter (Signed)
Patient walked in to clinic, returning to care. 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  She came in today, states she restarted her medication 3 weeks ago.  She now has Medicaid.  RN placed lab orders, will send Stribild refills to her pharmacy.  Patient scheduled for follow up with Dr. Linus Salmons 4/7.   Landis Gandy, RN

## 2014-07-28 LAB — HIV-1 RNA ULTRAQUANT REFLEX TO GENTYP+
HIV 1 RNA Quant: 29063 copies/mL — ABNORMAL HIGH (ref <20)
HIV-1 RNA Quant, Log: 4.46 {Log} — ABNORMAL HIGH (ref <1.30)

## 2014-07-28 LAB — T-HELPER CELL (CD4) - (RCID CLINIC ONLY)
CD4 T CELL HELPER: 37 % (ref 33–55)
CD4 T Cell Abs: 830 /uL (ref 400–2700)

## 2014-07-28 LAB — URINE CYTOLOGY ANCILLARY ONLY
Chlamydia: NEGATIVE
NEISSERIA GONORRHEA: NEGATIVE

## 2014-08-04 LAB — HIV-1 GENOTYPR PLUS

## 2014-08-08 ENCOUNTER — Ambulatory Visit: Payer: Medicaid Other | Admitting: Internal Medicine

## 2014-09-08 ENCOUNTER — Other Ambulatory Visit: Payer: Self-pay | Admitting: Internal Medicine

## 2014-09-08 ENCOUNTER — Ambulatory Visit (INDEPENDENT_AMBULATORY_CARE_PROVIDER_SITE_OTHER): Payer: Medicaid Other | Admitting: Internal Medicine

## 2014-09-08 ENCOUNTER — Encounter: Payer: Self-pay | Admitting: Internal Medicine

## 2014-09-08 VITALS — BP 148/112 | HR 103 | Temp 98.1°F | Wt 133.0 lb

## 2014-09-08 DIAGNOSIS — B20 Human immunodeficiency virus [HIV] disease: Secondary | ICD-10-CM | POA: Diagnosis not present

## 2014-09-08 NOTE — Assessment & Plan Note (Signed)
He continues on her medication and I will check her labs today. If things are stabilizing and no concerns for resistance she then can come back in 3 months. She will get labs prior to her next visit.

## 2014-09-08 NOTE — Progress Notes (Signed)
   Subjective:    Patient ID: Elizabeth [name], female    DOB: January 28, 1971, 44 y.o.   MRN: [id number]  HPI she comes in for follow-up of her HIV.  She continues on Stribild. She was on this last year and then was diagnosed with lung cancer and underwent resection. She then relapsed with drugs and has not been back since. She came in about 6 weeks ago wanting to get back into care and had her labs checked again and her CD4 count was 830 with 29,000 viral load. She then started her Stribild back. She's been taking this daily with no missed doses. She also remains cancer free by her report.  Genotype remained wild type.  Review of Systems  Constitutional: Negative for fatigue.  HENT: Negative for trouble swallowing.   Gastrointestinal: Negative for nausea and diarrhea.  Skin: Negative for rash.  Neurological: Negative for dizziness, light-headedness and headaches.       Objective:   Physical Exam  Constitutional: She appears well-developed and well-nourished. No distress.  HENT:  Mouth/Throat: No oropharyngeal exudate.  Eyes: No scleral icterus.  Cardiovascular: Normal rate, regular rhythm and normal heart sounds.   No murmur heard. Pulmonary/Chest: Effort normal and breath sounds normal. No respiratory distress.  Lymphadenopathy:    She has no cervical adenopathy.  Skin: No rash noted.          Assessment & Plan:

## 2014-09-09 LAB — T-HELPER CELL (CD4) - (RCID CLINIC ONLY)
CD4 % Helper T Cell: 39 % (ref 33–55)
CD4 T Cell Abs: 850 /uL (ref 400–2700)

## 2014-09-10 LAB — HIV-1 RNA QUANT-NO REFLEX-BLD
HIV 1 RNA QUANT: 40791 {copies}/mL — AB (ref <20)
HIV-1 RNA QUANT, LOG: 4.61 {Log} — AB (ref <1.30)

## 2014-09-12 ENCOUNTER — Telehealth: Payer: Self-pay | Admitting: Licensed Clinical Social Worker

## 2014-09-12 DIAGNOSIS — B20 Human immunodeficiency virus [HIV] disease: Secondary | ICD-10-CM

## 2014-09-12 NOTE — Telephone Encounter (Signed)
RN spoke with pt, shared Dr. Henreitta Leber message.  Made return appt w/ Dr. Tommy Medal for 10/05/14 when pt was able to come to [facility].  Has court appearance in [facility] that morning.

## 2014-09-12 NOTE — Telephone Encounter (Signed)
I called patient but her voicemail is not set up, so I left a message with her sister to tell her to call our office.

## 2014-09-12 NOTE — Telephone Encounter (Signed)
-----   Message from [place] Headings, MD sent at 09/12/2014 11:46 AM EDT ----- Can tell her to stop the Stribild which she has been on since  It is not working, add on a genotype and integrase inhibitor genotype and have her rescheduled for about 2 weeks or so.  Can be with a clinic doc if needed, sooner rather than later.  thanks

## 2014-09-15 ENCOUNTER — Encounter: Payer: Self-pay | Admitting: *Deleted

## 2014-09-17 LAB — HIV-1 INTEGRASE GENOTYPE

## 2014-09-22 LAB — HIV-1 GENOTYPR PLUS

## 2014-10-05 ENCOUNTER — Ambulatory Visit: Payer: Medicaid Other | Admitting: Infectious Disease

## 2014-10-13 ENCOUNTER — Telehealth: Payer: Self-pay | Admitting: Internal Medicine

## 2014-10-13 ENCOUNTER — Telehealth: Payer: Self-pay | Admitting: *Deleted

## 2014-10-13 DIAGNOSIS — J449 Chronic obstructive pulmonary disease, unspecified: Secondary | ICD-10-CM

## 2014-10-13 NOTE — Telephone Encounter (Signed)
That's great but I don't know any pulmonary docs there. She should start with a primary or UC and let them refer to whomever they usually use

## 2014-10-13 NOTE — Telephone Encounter (Signed)
Pt has moved to [place], [place] w/ her family.  Wanting to know local, [place] HIV resources.  [facility] Case Manager provided information from the [facility] in [place].  Shared this information with the pt.  Will also mail it to the patient's address, [address], [place], [place] [address].

## 2014-10-13 NOTE — Telephone Encounter (Signed)
Pt is wanting to know if we will refer her to a pulmonary office in [place], [place]. She is now drug free and would like Dr. Melvyn Novas to know this.  MW - please advise on referral. Thanks.

## 2014-10-13 NOTE — Telephone Encounter (Signed)
Pt is aware of MW's recommendation. Nothing further was needed.

## 2014-10-14 NOTE — Telephone Encounter (Signed)
Order has been placed. lmtcb x1 for pt.

## 2014-10-14 NOTE — Telephone Encounter (Signed)
Return [name] °

## 2014-10-14 NOTE — Telephone Encounter (Signed)
lmtcb x2 for pt. 

## 2014-10-14 NOTE — Telephone Encounter (Signed)
Fine with me, dx GOLD III copd sp wedge resection RUL T2 adenoca

## 2014-10-17 NOTE — Telephone Encounter (Signed)
Pt is aware by message that we have placed this referral for her. Nothing further is needed.

## 2014-10-25 NOTE — Telephone Encounter (Signed)
[facility] calling to confirm fax number to send signed records release.  Confirmed. Will send last office note, labs once receive the signed release. Will give the release into [facility] for the full chart. Landis Gandy, RN

## 2014-12-13 ENCOUNTER — Ambulatory Visit: Payer: Medicaid Other | Admitting: Internal Medicine

## 2015-01-30 ENCOUNTER — Telehealth: Payer: Self-pay | Admitting: Internal Medicine

## 2015-01-30 NOTE — Telephone Encounter (Signed)
Pt called back with fax # [phone]

## 2015-01-30 NOTE — Telephone Encounter (Signed)
ATC pt - went directly to VM stating VM has not yet been set up.  Unable to leave msg.  WCB

## 2015-01-30 NOTE — Telephone Encounter (Signed)
Then her present doctor should clear her - no can do if haven't seen her here in over a year

## 2015-01-30 NOTE — Telephone Encounter (Signed)
Patient requesting a letter to be faxed to Dr. Vevelyn Royals for surgical clearance so she can have surgery tomorrow to remove fatty tumor from breast and will need to put her to sleep for that.  Patient says that she has not gotten established with another Pulmonary specialist in [place].    Last OV: 01/18/2014  Dr. Melvyn Novas, please advise.

## 2015-01-31 NOTE — Telephone Encounter (Signed)
ATC PT-went to VM and VM has not been set up yet--WCB

## 2015-02-01 ENCOUNTER — Telehealth: Payer: Self-pay | Admitting: *Deleted

## 2015-02-01 NOTE — Telephone Encounter (Signed)
Called and spoke to pt. Informed her of the recs per Dr. Melvyn Novas. Pt stated she has already had the surgery and nothing further is needed. Will sign off.

## 2015-02-01 NOTE — Telephone Encounter (Signed)
Pt had surgery in [place] 03/03/15.  Although the pt is now living in [place] she does want to continue coming to [facility] for HIV care per her sister.  Pt is recovering from surgery today.  Will call pt tomorrow to discuss scheduling follow-up visits.

## 2015-06-01 IMAGING — CR DG CHEST 1V
1 series · 1 of 1 positions shown · non-contrast
Comparison: CT thorax 03/30/2013

CLINICAL DATA: History of asthma, seizures

EXAM:
CHEST - 1 VIEW

[x chest ap]
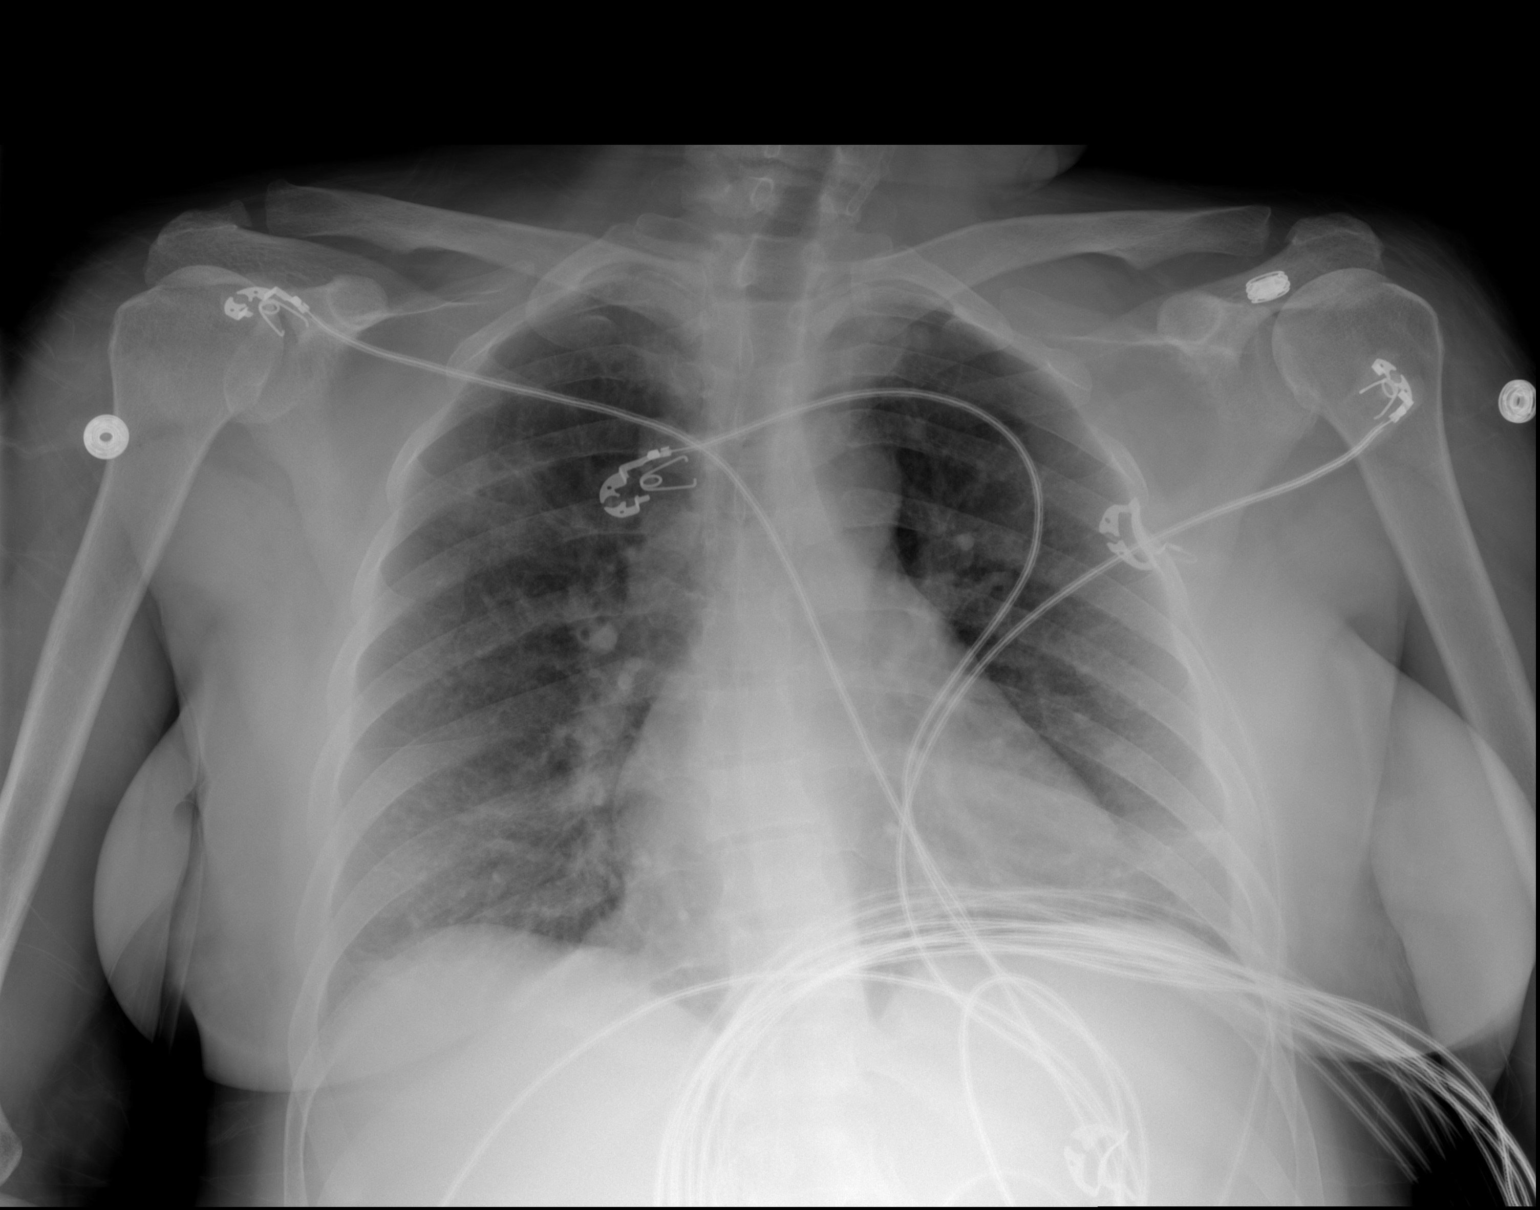

[1 of 1 positions shown; findings below may reference images not displayed]

FINDINGS: Mild cardiomegaly. Vascular pattern normal. Mild scarring left base.
Rounded area of opacity measuring about 2.3 cm right upper lobe.
This corresponds to known mass seen on the prior study.
IMPRESSION: No active disease. Known lung mass as seen on recent chest CT.
Malignancy not excluded.

## 2015-06-01 IMAGING — CT CT HEAD W/O CM
3 of 4 series · 18 of 30 positions shown, 20 images · non-contrast
Comparison: None.

CLINICAL DATA: Multiples seizures today, patient hit head on
concrete, history of HIV

EXAM:
CT HEAD WITHOUT CONTRAST
TECHNIQUE: Contiguous axial images were obtained from the base of the skull
through the vertex without intravenous contrast.

[Series 2: head w/o · axial · non-contrast · 0.39mm/px · z∈[+1224,+1339]mm · 7 of 31 slices shown, 9 images]
[im 4/31  brain]
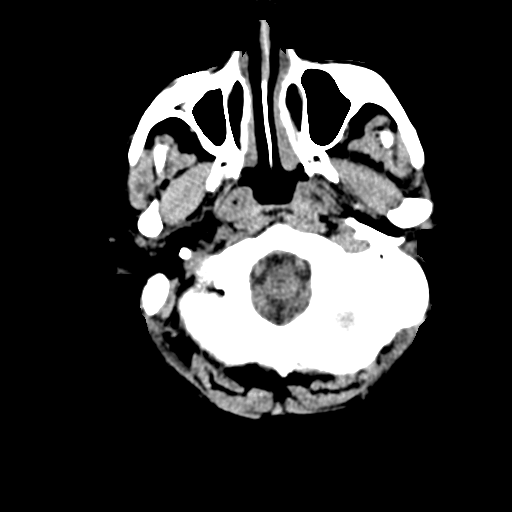
[im 4/31  bone]
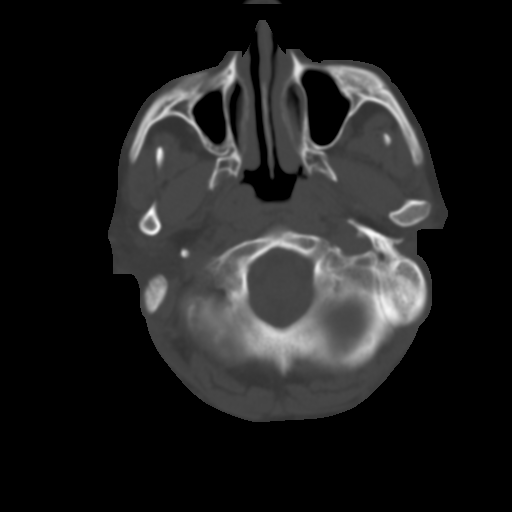
[im 8/31  brain]
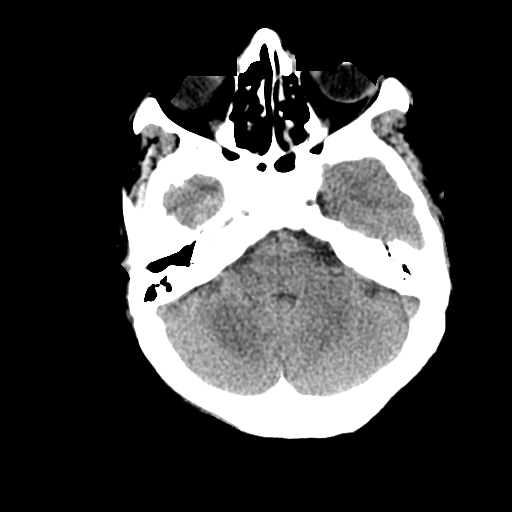
[im 12/31  brain]
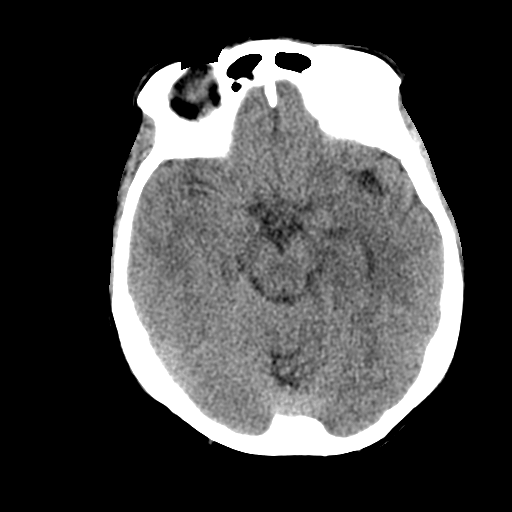
[im 16/31  brain]
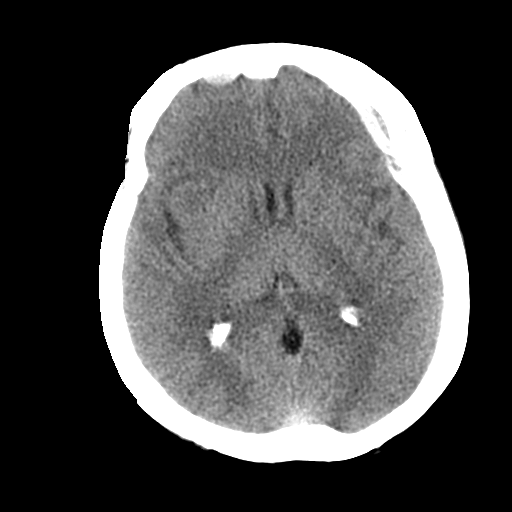
[im 19/31  brain]
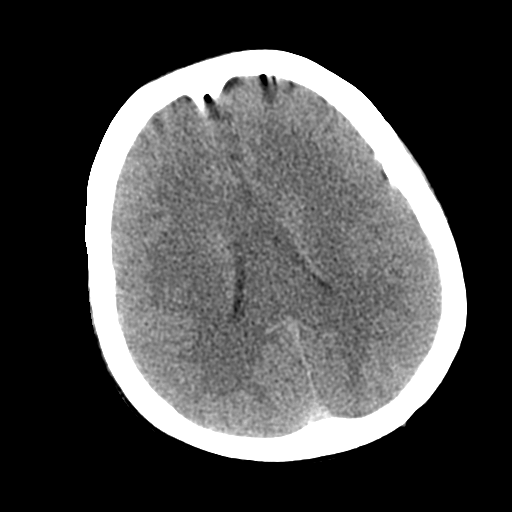
[im 19/31  bone]
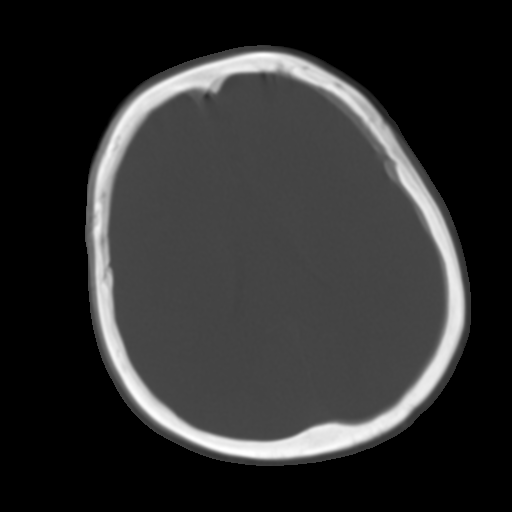
[im 23/31  brain]
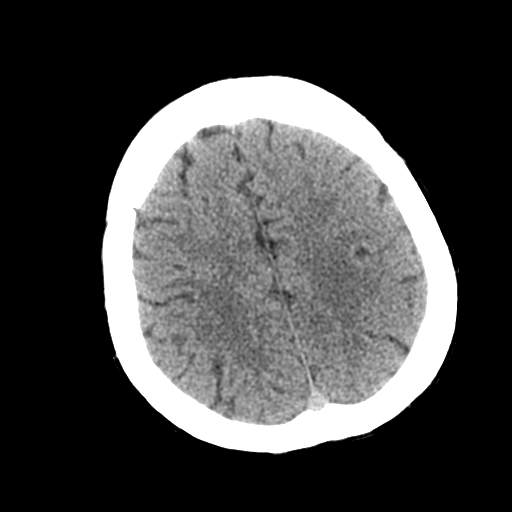
[im 27/31  brain]
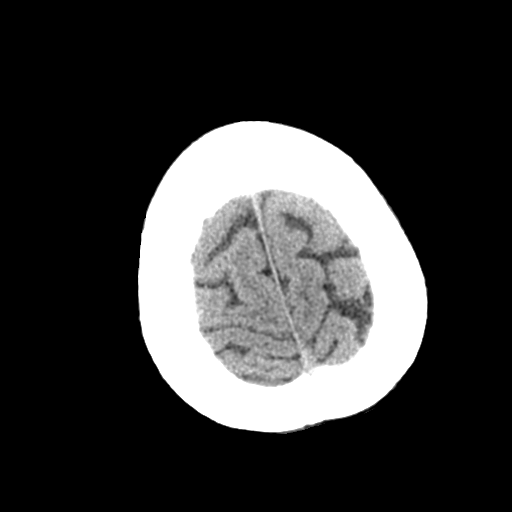

[Series 3: bone windows · axial · 0.39mm/px · z∈[+1221,+1347]mm · 8 of 52 slices shown (1 of 2)]
[im 5/52  bone]
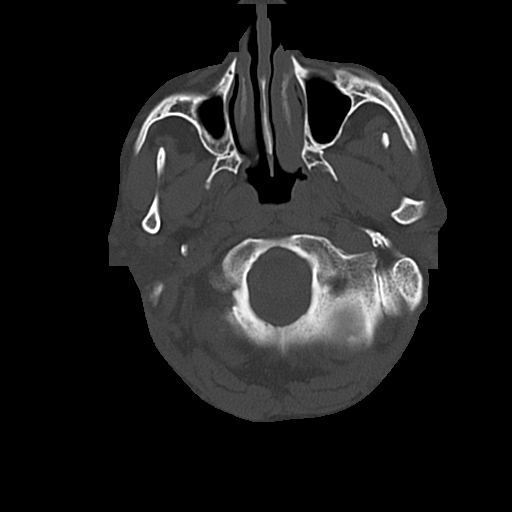
[im 13/52  bone]
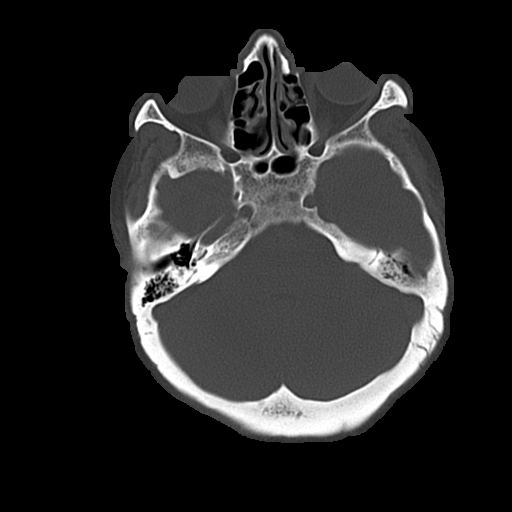
[im 18/52  bone]
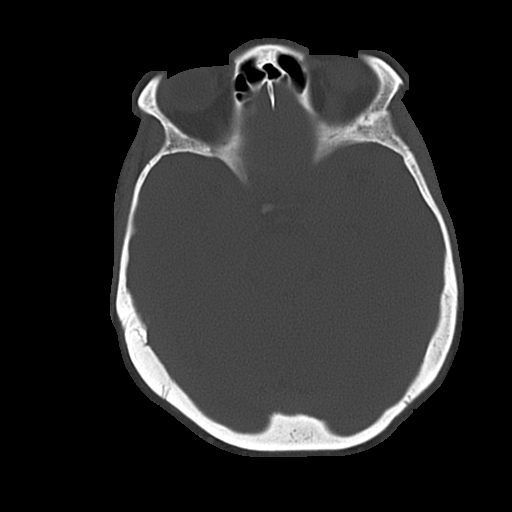
[im 22/52  bone]
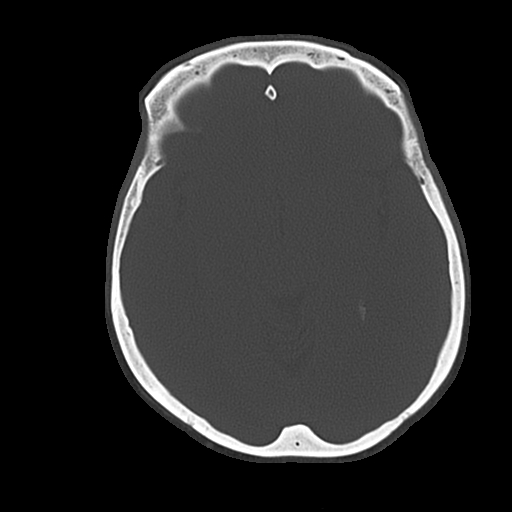
[im 30/52  bone]
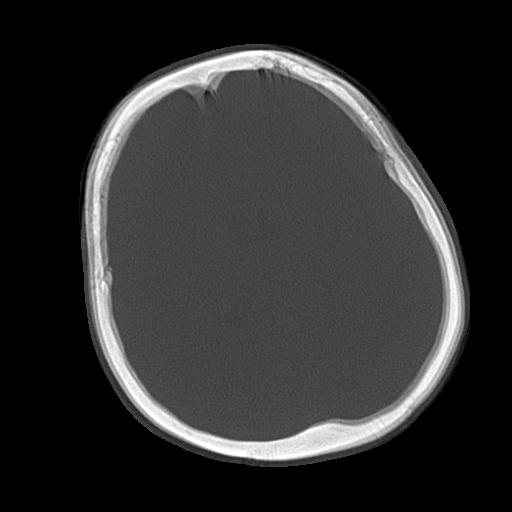
[im 35/52  bone]
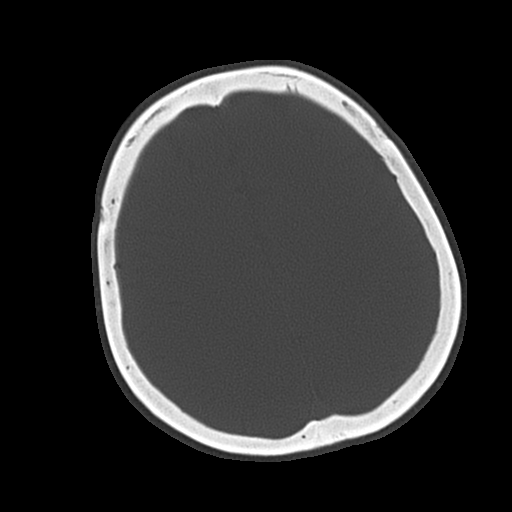
[im 39/52  bone]
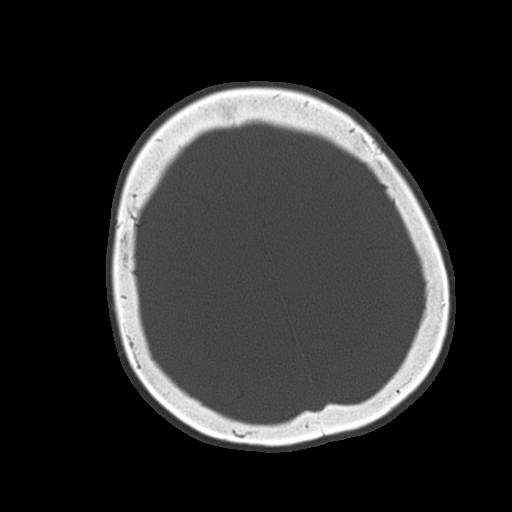
[im 47/52  bone]
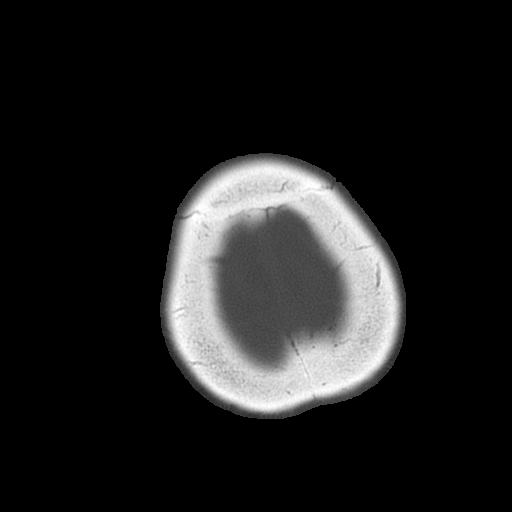

[Series 5: bone windows · axial · 0.39mm/px · z∈[+1273,+1303]mm · 3 of 21 slices shown (2 of 2)]
[im 6/21  bone]
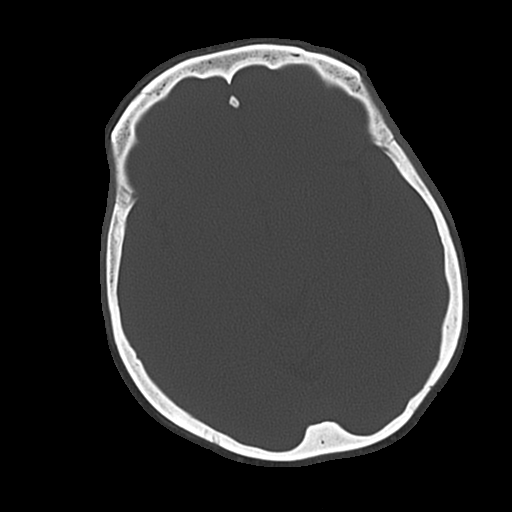
[im 11/21  bone]
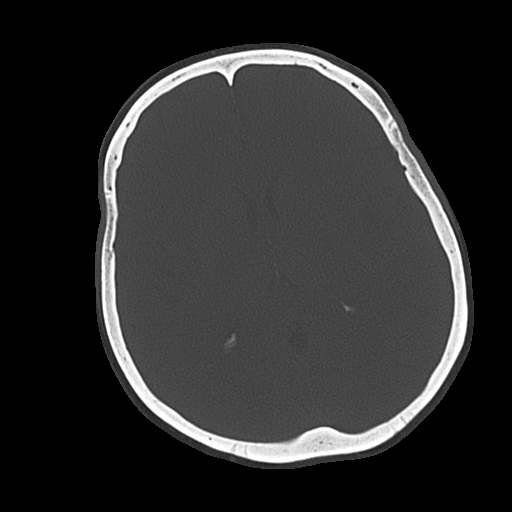
[im 16/21  bone]
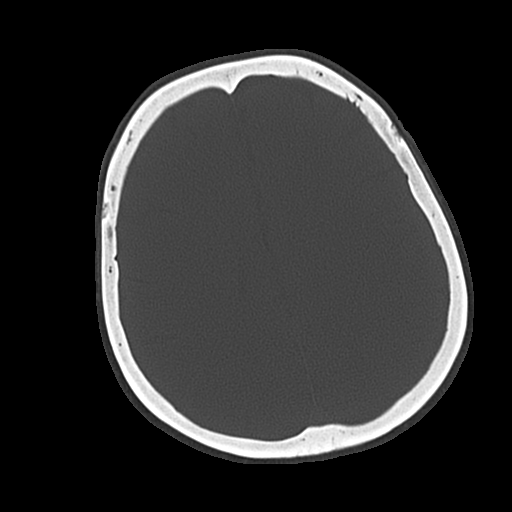

[18 of 30 positions shown; findings below may reference images not displayed]

FINDINGS: No mass lesion. No midline shift. No acute hemorrhage or hematoma.
No extra-axial fluid collections. No evidence of acute infarction.
Calvarium intact.
IMPRESSION: Negative

## 2016-05-20 DIAGNOSIS — F1410 Cocaine abuse, uncomplicated: Secondary | ICD-10-CM | POA: Insufficient documentation

## 2016-05-20 DIAGNOSIS — F411 Generalized anxiety disorder: Secondary | ICD-10-CM | POA: Insufficient documentation

## 2017-02-22 DIAGNOSIS — F1910 Other psychoactive substance abuse, uncomplicated: Secondary | ICD-10-CM | POA: Insufficient documentation

## 2017-03-07 DIAGNOSIS — C3490 Malignant neoplasm of unspecified part of unspecified bronchus or lung: Secondary | ICD-10-CM | POA: Insufficient documentation

## 2017-05-10 DIAGNOSIS — D72829 Elevated white blood cell count, unspecified: Secondary | ICD-10-CM | POA: Insufficient documentation

## 2018-01-29 DIAGNOSIS — R569 Unspecified convulsions: Secondary | ICD-10-CM | POA: Insufficient documentation

## 2018-01-29 DIAGNOSIS — N83201 Unspecified ovarian cyst, right side: Secondary | ICD-10-CM | POA: Insufficient documentation

## 2018-03-10 DIAGNOSIS — Z902 Acquired absence of lung [part of]: Secondary | ICD-10-CM | POA: Insufficient documentation

## 2018-03-28 ENCOUNTER — Other Ambulatory Visit: Payer: Self-pay

## 2018-03-28 ENCOUNTER — Emergency Department (HOSPITAL_COMMUNITY): Payer: Medicaid Other

## 2018-03-28 ENCOUNTER — Encounter (HOSPITAL_COMMUNITY): Payer: Self-pay | Admitting: Emergency Medicine

## 2018-03-28 ENCOUNTER — Emergency Department (HOSPITAL_COMMUNITY)
Admission: EM | Admit: 2018-03-28 | Discharge: 2018-03-29 | Disposition: A | Discharge status: Home / Self Care | Payer: Medicaid Other | Attending: Emergency Medicine | Admitting: Emergency Medicine

## 2018-03-28 DIAGNOSIS — J45909 Unspecified asthma, uncomplicated: Secondary | ICD-10-CM | POA: Insufficient documentation

## 2018-03-28 DIAGNOSIS — Z21 Asymptomatic human immunodeficiency virus [HIV] infection status: Secondary | ICD-10-CM | POA: Diagnosis not present

## 2018-03-28 DIAGNOSIS — S0083XA Contusion of other part of head, initial encounter: Secondary | ICD-10-CM | POA: Insufficient documentation

## 2018-03-28 DIAGNOSIS — Z87891 Personal history of nicotine dependence: Secondary | ICD-10-CM | POA: Diagnosis not present

## 2018-03-28 DIAGNOSIS — Y999 Unspecified external cause status: Secondary | ICD-10-CM | POA: Insufficient documentation

## 2018-03-28 DIAGNOSIS — Y9389 Activity, other specified: Secondary | ICD-10-CM | POA: Diagnosis not present

## 2018-03-28 DIAGNOSIS — W228XXA Striking against or struck by other objects, initial encounter: Secondary | ICD-10-CM | POA: Diagnosis not present

## 2018-03-28 DIAGNOSIS — R569 Unspecified convulsions: Secondary | ICD-10-CM | POA: Diagnosis not present

## 2018-03-28 DIAGNOSIS — R9431 Abnormal electrocardiogram [ECG] [EKG]: Secondary | ICD-10-CM | POA: Diagnosis not present

## 2018-03-28 DIAGNOSIS — M791 Myalgia, unspecified site: Secondary | ICD-10-CM | POA: Diagnosis not present

## 2018-03-28 DIAGNOSIS — Y929 Unspecified place or not applicable: Secondary | ICD-10-CM | POA: Diagnosis not present

## 2018-03-28 DIAGNOSIS — G40909 Epilepsy, unspecified, not intractable, without status epilepticus: Secondary | ICD-10-CM

## 2018-03-28 DIAGNOSIS — Z79899 Other long term (current) drug therapy: Secondary | ICD-10-CM | POA: Insufficient documentation

## 2018-03-28 DIAGNOSIS — S0093XA Contusion of unspecified part of head, initial encounter: Secondary | ICD-10-CM

## 2018-03-28 DIAGNOSIS — J449 Chronic obstructive pulmonary disease, unspecified: Secondary | ICD-10-CM | POA: Insufficient documentation

## 2018-03-28 LAB — URINALYSIS, ROUTINE W REFLEX MICROSCOPIC
BILIRUBIN URINE: NEGATIVE
Glucose, UA: NEGATIVE mg/dL
HGB URINE DIPSTICK: NEGATIVE
Ketones, ur: NEGATIVE mg/dL
Leukocytes, UA: NEGATIVE
Nitrite: NEGATIVE
Protein, ur: NEGATIVE mg/dL
SPECIFIC GRAVITY, URINE: 1.014 (ref 1.005–1.030)
pH: 6 (ref 5.0–8.0)

## 2018-03-28 NOTE — ED Triage Notes (Addendum)
EMS called out for shaking and chills. EMS reports pt is hot to touch and was shaking with chills upon arrival. Pt reports generalized body aches, weak, fever, chills x 2 days. Pt is alert and oriented x 4. Pt reports fall earlier today and hitting head, ?syncope. Pt husband reports pt has had "15 seizures today."

## 2018-03-29 LAB — COMPREHENSIVE METABOLIC PANEL
ALBUMIN: 3.9 g/dL (ref 3.5–5.0)
ALK PHOS: 94 U/L (ref 38–126)
ALT: 10 U/L (ref 0–44)
ANION GAP: 7 (ref 5–15)
AST: 12 U/L — ABNORMAL LOW (ref 15–41)
BUN: 14 mg/dL (ref 6–20)
CO2: 29 mmol/L (ref 22–32)
Calcium: 9.4 mg/dL (ref 8.9–10.3)
Chloride: 103 mmol/L (ref 98–111)
Creatinine, Ser: 0.77 mg/dL (ref 0.44–1.00)
GFR calc Af Amer: >60 mL/min (ref >60)
GFR calc non Af Amer: >60 mL/min (ref >60)
GLUCOSE: 123 mg/dL — AB (ref 70–99)
Potassium: 3.7 mmol/L (ref 3.5–5.1)
Sodium: 139 mmol/L (ref 135–145)
Total Bilirubin: 0.6 mg/dL (ref 0.3–1.2)
Total Protein: 6.5 g/dL (ref 6.5–8.1)

## 2018-03-29 LAB — CBC WITH DIFFERENTIAL/PLATELET
Abs Immature Granulocytes: 0.01 10*3/uL (ref 0.00–0.07)
BASOS ABS: 0.1 10*3/uL (ref 0.0–0.1)
Basophils Relative: 1 %
Eosinophils Absolute: 0.3 10*3/uL (ref 0.0–0.5)
Eosinophils Relative: 4 %
HEMATOCRIT: 39.3 % (ref 36.0–46.0)
HEMOGLOBIN: 12.6 g/dL (ref 12.0–15.0)
Immature Granulocytes: 0 %
LYMPHS ABS: 3.5 10*3/uL (ref 0.7–4.0)
LYMPHS PCT: 48 %
MCH: 28.2 pg (ref 26.0–34.0)
MCHC: 32.1 g/dL (ref 30.0–36.0)
MCV: 87.9 fL (ref 80.0–100.0)
MONO ABS: 0.7 10*3/uL (ref 0.1–1.0)
Monocytes Relative: 9 %
NEUTROS ABS: 2.8 10*3/uL (ref 1.7–7.7)
Neutrophils Relative %: 38 %
Platelets: 311 10*3/uL (ref 150–400)
RBC: 4.47 MIL/uL (ref 3.87–5.11)
RDW: 13.5 % (ref 11.5–15.5)
WBC: 7.4 10*3/uL (ref 4.0–10.5)
nRBC: 0 % (ref 0.0–0.2)

## 2018-03-29 MED ORDER — HYDROCODONE-ACETAMINOPHEN 5-325 MG PO TABS: 2.0000 | ORAL_TABLET | ORAL | 0 refills | Status: DC | PRN

## 2018-03-29 MED ORDER — OXYCODONE-ACETAMINOPHEN 5-325 MG PO TABS
2.0000 | ORAL_TABLET | Freq: Once | ORAL | Status: AC
  Administered 2018-03-29: 2 via ORAL
  Filled 2018-03-29: qty 2

## 2018-03-29 NOTE — Discharge Instructions (Signed)
See your Physician for recheck.  Avoid medications that can cause increased QT.

## 2018-03-29 NOTE — ED Provider Notes (Addendum)
[facility] EMERGENCY DEPARTMENT Provider Note   CSN: [id number] Arrival date & time: 03/28/18  2227     History   Chief Complaint Chief Complaint  Patient presents with  . flu like symptoms    HPI Elizabeth [name] is a 47 y.o. female.  The history is provided by the patient and the spouse. No language interpreter was used.  Seizures   This is a new problem. The current episode started 12 to 24 hours ago. The problem has been gradually worsening. There were more than 10 seizures. Characteristics include rhythmic jerking. The episode was witnessed. The seizures did not continue in the ED. Possible causes include medication or dosage change and sleep deprivation. There has been no fever. There were no medications administered prior to arrival.   Pt's husband reports pt has had at least 15 seizures today.  He reports pt has been having frequent seizures since her medication was changed from depakote to keppra 2 months ago.  Pt is in [place] visiting family.  Pt reports increased stress, decreased sleep.  Pt has been taking her medication as directed. Husband reports pt hit her head hard with one seizure.  Pt reports she is having bodyaches and muscle aches.   Past Medical History:  Diagnosis Date  . Asthma    uses Albuterol inhaler daily as needed  . Depression    takes Trazodone and Seroquel nightly  . Dizziness   . Dysphagia   . Headache(784.0)   . History of migraine    last one about 2wks ago  . HIV (human immunodeficiency virus infection) ([place])    takes Stribild daily  . Lung cancer ([place])   . Nocturia   . Seizures ([place])    takes Depakote daily-last one over a week ago  . Shortness of breath    sitting/lying/exertion  . Substance abuse ([place])   . Urinary frequency     Patient Active Problem List   Diagnosis Date Noted  . COPD exacerbation ([place]) 10/20/2013  . COPD (chronic obstructive pulmonary disease) ([place]) 10/20/2013  . 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  Hemoptysis 05/08/2013  . Dyspnea 04/30/2013  . Abnormal uterine bleeding (AUB) 04/13/2013  . Pulmonary mass 04/13/2013  . CARBUNCLE AND FURUNCLE OF TRUNK 01/12/2010  . DEPRESSION 11/29/2009  . BREAST MASS, LEFT 11/29/2009  . BACK PAIN, LUMBAR 11/29/2009  . Human immunodeficiency virus (HIV) disease ([place]) 10/26/2009  . SMOKER 10/26/2009  . COPD GOLD III 10/26/2009  . COCAINE ABUSE, HX OF 10/26/2009    Past Surgical History:  Procedure Laterality Date  . CESAREAN SECTION  68yr ago  . VIDEO ASSISTED THORACOSCOPY (VATS)/WEDGE RESECTION Right 07/05/2013   Procedure: VIDEO ASSISTED THORACOSCOPY (VATS)/WEDGE RESECTION, Right Upper Lobectomy with lymph node disecction and OnQ placement;  Surgeon: SMelrose Nakayama MD;  [facility]  Service: Thoracic;  Laterality: Right;  (R)VATS, WEDGE RESECTION, POSSIBLE LOBECTOMY, POSSIBLE CHEST WALL RESECTION  . VIDEO BRONCHOSCOPY Bilateral 05/05/2013   Procedure: VIDEO BRONCHOSCOPY WITH FLUORO;  Surgeon: MTanda Rockers MD;  Location: [facility];  Service: Cardiopulmonary;  Laterality: Bilateral;     OB History   None      Home Medications    Prior to Admission medications   Medication Sig Start Date End Date Taking? Authorizing Provider  albuterol (PROVENTIL HFA;VENTOLIN HFA) 108 (90 BASE) MCG/ACT inhaler Inhale 2 puffs into the lungs every 6 (six) hours as needed for wheezing or shortness of breath.   Yes [provider]  albuterol (PROVENTIL) (2.5  MG/3ML) 0.083% nebulizer solution Take 6 mLs (5 mg total) by nebulization every 6 (six) hours as needed for wheezing or shortness of breath. 10/28/13  Yes Kirichenko, Tatyana, PA-C  atorvastatin (LIPITOR) 20 MG tablet Take 20 mg by mouth daily.  03/19/18  Yes [provider]  divalproex (DEPAKOTE) 250 MG DR tablet Take 250-500 mg by mouth 2 (two) times daily. 250 mg in the am and 500 in the pm   Yes [provider]  famotidine (PEPCID) 20 MG tablet Take 20 mg by mouth 2  (two) times daily.    Yes [provider]  GENVOYA 150-150-200-10 MG TABS tablet Take 1 tablet by mouth every morning.  03/19/18  Yes [provider]  ipratropium-albuterol (DUONEB) 0.5-2.5 (3) MG/3ML SOLN Inhale 3 mLs into the lungs every 6 (six) hours as needed (for shortness of breath-wheezing).  03/19/18  Yes [provider]  levETIRAcetam (KEPPRA) 750 MG tablet Take 750 mg by mouth 2 (two) times daily.  03/19/18  Yes [provider]  rOPINIRole (REQUIP) 1 MG tablet Take 1 mg by mouth at bedtime.  03/19/18  Yes [provider]  SPIRIVA HANDIHALER 18 MCG inhalation capsule Place 18 mcg into inhaler and inhale daily.  03/19/18  Yes [provider]  sucralfate (CARAFATE) 1 g tablet Take 1 g by mouth 4 (four) times daily.    Yes [provider]  SYMBICORT 160-4.5 MCG/ACT inhaler Inhale 2 puffs into the lungs 2 (two) times daily.  03/19/18  Yes [provider]    Family History Family History  Problem Relation Age of Onset  . Diabetes Mother   . CAD Mother   . Hypertension Mother   . Hypertension Sister   . Lung cancer Maternal Aunt        smoked  . Allergies Sister   . Asthma Sister   . Lung cancer Maternal Uncle        never smoker  . Lung cancer Maternal Grandmother        never smoker    Social History Social History   Tobacco Use  . Smoking status: Former Smoker    Packs/day: 0.25    Years: 24.00    Pack years: 6.00    Types: Cigarettes    Last attempt to quit: 11/01/2013    Years since quitting: 4.4  . Smokeless tobacco: Never Used  Substance Use Topics  . Alcohol use: No    Alcohol/week: 0.0 standard drinks  . Drug use: No    Types: "Crack" cocaine    Comment: drug relapse 05/2014, clean x 3 months 09/08/14- 03/28/18 pt reports in recovery x 2 years at this time.      Allergies   Gabapentin; Penicillins; Ibuprofen; and Tramadol   Review of Systems Review of Systems  Neurological: Positive  for seizures.  All other systems reviewed and are negative.    Physical Exam Updated Vital Signs BP (!) 132/105 (BP Location: Right Arm)   Pulse 96   Temp 98 F (36.7 C) (Oral)   Resp 19   Ht 5' (1.524 m)   Wt 66.7 kg   SpO2 100%   BMI 28.71 kg/m   Physical Exam  Constitutional: She is oriented to person, place, and time. She appears well-developed and well-nourished.  HENT:  Head: Normocephalic.  Right Ear: External ear normal.  Left Ear: External ear normal.  Mouth/Throat: Oropharynx is clear and moist.  Eyes: Pupils are equal, round, and reactive to light. Conjunctivae and EOM  are normal.  Neck: Normal range of motion.  Cardiovascular: Normal rate.  Pulmonary/Chest: Effort normal.  Abdominal: Soft. She exhibits no distension.  Musculoskeletal: Normal range of motion.  Neurological: She is alert and oriented to person, place, and time.  Skin: Skin is warm.  Psychiatric: She has a normal mood and affect.  Nursing note and vitals reviewed.    ED Treatments / Results  Labs (all labs ordered are listed, but only abnormal results are displayed) Labs Reviewed  COMPREHENSIVE METABOLIC PANEL - Abnormal; Notable for the following components:      Result Value   Glucose, Bld 123 (*)    AST 12 (*)    All other components within normal limits  CBC WITH DIFFERENTIAL/PLATELET  URINALYSIS, ROUTINE W REFLEX MICROSCOPIC    EKG EKG Interpretation  Date/Time:  Sunday March 29 2018 00:49:18 EDT Ventricular Rate:  88 PR Interval:    QRS Duration: 97 QT Interval:  507 QTC Calculation: 614 R Axis:   67 Text Interpretation:  Sinus rhythm Borderline T abnormalities, anterior leads Prolonged QT interval Confirmed by Wickline, Donald ([id number]) on 03/29/2018 12:52:50 AM   Radiology Dg Chest 2 View  Result Date: 03/29/2018 CLINICAL DATA:  47 year old female with body aches. History of lung cancer. EXAM: CHEST - 2 VIEW COMPARISON:  Chest radiograph dated 01/16/2014 FINDINGS:  Stable postsurgical and radiation changes of the right lung. No new consolidative changes. There is no pleural effusion or pneumothorax. The cardiac silhouette is within normal limits. No acute osseous pathology. IMPRESSION: 1. No acute cardiopulmonary process. 2. Stable postsurgical and radiation changes of the right lung. Electronically Signed   By: Arash  Radparvar M.D.   On: 03/29/2018 00:39   Ct Head Wo Contrast  Result Date: 03/29/2018 CLINICAL DATA:  47 year old female with head trauma. EXAM: CT HEAD WITHOUT CONTRAST TECHNIQUE: Contiguous axial images were obtained from the base of the skull through the vertex without intravenous contrast. COMPARISON:  Head CT dated 05/26/2013 FINDINGS: Brain: No evidence of acute infarction, hemorrhage, hydrocephalus, extra-axial collection or mass lesion/mass effect. Vascular: No hyperdense vessel or unexpected calcification. Skull: Normal. Negative for fracture or focal lesion. Sinuses/Orbits: No acute finding. Other: None. IMPRESSION: Normal noncontrast CT of the brain. Electronically Signed   By: Arash  Radparvar M.D.   On: 03/29/2018 00:55    Procedures Procedures (including critical care time)  Medications Ordered in ED Medications  oxyCODONE-acetaminophen (PERCOCET/ROXICET) 5-325 MG per tablet 2 tablet (2 tablets Oral Given 03/29/18 0027)     Initial Impression / Assessment and Plan / ED Course  I have reviewed the triage vital signs and the nursing notes.  Pertinent labs & imaging results that were available during my care of the patient were reviewed by me and considered in my medical decision making (see chart for details).    Pt counseled on prolonged qt.  Pt advised to follow up with her Md for evaluation of seizure medication change.  [place] database reviewed.    Final Clinical Impressions(s) / ED Diagnoses   Final diagnoses:  Seizure disorder (HCC)  Contusion of head, unspecified part of head, initial encounter  Muscle ache    Prolonged Q-T interval on ECG    ED Discharge Orders         Ordered    HYDROcodone-acetaminophen (NORCO/VICODIN) 5-325 MG tablet  Every 4 hours PRN     10 /27/19 0116        An After Visit Summary was printed and given to the patient.    Strum,  Hollace Kinnier, PA-C 03/29/18 0109    Fransico Meadow, PA-C 03/29/18 6010    Ripley Fraise, MD 03/29/18 [phone]

## 2018-03-29 NOTE — ED Provider Notes (Signed)
Patient seen/examined in the Emergency Department in conjunction with Midlevel Provider [place] Patient reports multiple seizures today fall hitting her head.  She reports diffuse body aches. Exam : Awake alert, no acute distress, no focal weakness. Plan: Appropriate imaging/labs been ordered.  Of note, her EKG does reveal a prolonged QT when compared to prior.  Reviewing her labs, there is no obvious trigger from her meds.  This will need to be monitored as an outpatient    Ripley Fraise, MD 03/29/18 [phone]

## 2018-06-29 DIAGNOSIS — G5790 Unspecified mononeuropathy of unspecified lower limb: Secondary | ICD-10-CM | POA: Insufficient documentation

## 2018-06-29 DIAGNOSIS — Z79899 Other long term (current) drug therapy: Secondary | ICD-10-CM | POA: Insufficient documentation

## 2018-10-21 DIAGNOSIS — M25569 Pain in unspecified knee: Secondary | ICD-10-CM | POA: Insufficient documentation

## 2019-01-02 ENCOUNTER — Emergency Department (HOSPITAL_COMMUNITY)
Admission: EM | Admit: 2019-01-02 | Discharge: 2019-01-02 | Disposition: A | Discharge status: Home / Self Care | Payer: Medicaid Other | Attending: Emergency Medicine | Admitting: Emergency Medicine

## 2019-01-02 ENCOUNTER — Encounter (HOSPITAL_COMMUNITY): Payer: Self-pay | Admitting: *Deleted

## 2019-01-02 ENCOUNTER — Emergency Department (HOSPITAL_COMMUNITY): Payer: Medicaid Other

## 2019-01-02 ENCOUNTER — Other Ambulatory Visit: Payer: Self-pay

## 2019-01-02 DIAGNOSIS — Y998 Other external cause status: Secondary | ICD-10-CM | POA: Diagnosis not present

## 2019-01-02 DIAGNOSIS — Y929 Unspecified place or not applicable: Secondary | ICD-10-CM | POA: Diagnosis not present

## 2019-01-02 DIAGNOSIS — M25522 Pain in left elbow: Secondary | ICD-10-CM | POA: Diagnosis not present

## 2019-01-02 DIAGNOSIS — W01198A Fall on same level from slipping, tripping and stumbling with subsequent striking against other object, initial encounter: Secondary | ICD-10-CM | POA: Diagnosis not present

## 2019-01-02 DIAGNOSIS — J449 Chronic obstructive pulmonary disease, unspecified: Secondary | ICD-10-CM

## 2019-01-02 DIAGNOSIS — Z79899 Other long term (current) drug therapy: Secondary | ICD-10-CM | POA: Diagnosis not present

## 2019-01-02 DIAGNOSIS — W19XXXA Unspecified fall, initial encounter: Secondary | ICD-10-CM

## 2019-01-02 DIAGNOSIS — F17210 Nicotine dependence, cigarettes, uncomplicated: Secondary | ICD-10-CM | POA: Insufficient documentation

## 2019-01-02 DIAGNOSIS — Y9301 Activity, walking, marching and hiking: Secondary | ICD-10-CM | POA: Diagnosis not present

## 2019-01-02 DIAGNOSIS — B20 Human immunodeficiency virus [HIV] disease: Secondary | ICD-10-CM | POA: Insufficient documentation

## 2019-01-02 MED ORDER — KETOROLAC TROMETHAMINE 30 MG/ML IJ SOLN
30.0000 mg | Freq: Once | INTRAMUSCULAR | Status: AC
  Administered 2019-01-02: 30 mg via INTRAMUSCULAR
  Filled 2019-01-02: qty 1

## 2019-01-02 MED ORDER — PREDNISONE 50 MG PO TABS: 50.0000 mg | ORAL_TABLET | Freq: Every day | ORAL | 0 refills | Status: AC

## 2019-01-02 MED ORDER — ACETAMINOPHEN 325 MG PO TABS
650.0000 mg | ORAL_TABLET | Freq: Once | ORAL | Status: DC
  Filled 2019-01-02: qty 2

## 2019-01-02 MED ORDER — AEROCHAMBER PLUS FLO-VU MEDIUM MISC
1.0000 | Freq: Once | Status: AC
  Administered 2019-01-02: 14:00:00
  Filled 2019-01-02: qty 1

## 2019-01-02 MED ORDER — PREDNISONE 50 MG PO TABS
60.0000 mg | ORAL_TABLET | Freq: Once | ORAL | Status: AC
  Administered 2019-01-02: 60 mg via ORAL
  Filled 2019-01-02: qty 1

## 2019-01-02 MED ORDER — ALBUTEROL SULFATE HFA 108 (90 BASE) MCG/ACT IN AERS
8.0000 | INHALATION_SPRAY | Freq: Once | RESPIRATORY_TRACT | Status: AC
  Administered 2019-01-02: 8 via RESPIRATORY_TRACT
  Filled 2019-01-02: qty 6.7

## 2019-01-02 NOTE — ED Triage Notes (Addendum)
Pt c/o pain to left elbow after fall last evening after she tripped. Pt's elbow is swollen and bruised. Denies loss of consciousness.   Pt constantly nodding off during triage. Pt's head falls back or down and then she jerks when she wakes up.   Pt's O2 sat 88% in triage, pt reports she wears oxygen at home but isn't currently wearing any. Pt taken to ED room and placed on oxygen by triage RN.

## 2019-01-02 NOTE — ED Provider Notes (Signed)
[facility] EMERGENCY DEPARTMENT Provider Note   CSN: [id number] Arrival date & time: 01/02/19  1329    History   Chief Complaint Chief Complaint  Patient presents with   Elbow Injury    HPI Elizabeth [name] is a 48 y.o. female.     HPI   Patient is a 48 year old female with a history of asthma, COPD, HIV, substance use, seizures, who presents to the emergency department today for evaluation of left elbow pain.  States that she tripped and fell yesterday landing on her left elbow.  She now has constant pain that is worse with movement.  It is also somewhat swollen.  She denies any head trauma or LOC.  Denies any other injuries from the fall.  On arrival to the ED, patient noted to be hypoxic with sats of 88%.  She states that she normally is on 2 L of oxygen at home however she has not been using it for the past several days.  She is here from out of town. States she traveled 2 hours to get here.  States she did not bring her nebulizer with her.  She has been taking her inhaler.  She has had more coughing and chest tightness since she has been here.  No fevers.  States she is a little sleepy during this visit because she just took her seizure medicine prior to coming in.   Past Medical History:  Diagnosis Date   Asthma    uses Albuterol inhaler daily as needed   Depression    takes Trazodone and Seroquel nightly   Dizziness    Dysphagia    Headache(784.0)    History of migraine    last one about 2wks ago   HIV (human immunodeficiency virus infection) ([place])    takes Stribild daily   Lung cancer ([place])    Nocturia    Seizures ([place])    takes Depakote daily-last one about 6 months ago as of 01/02/19   Shortness of breath    sitting/lying/exertion   Substance abuse ([place])    Urinary frequency     Patient Active Problem List   Diagnosis Date Noted   COPD exacerbation ([place]) 10/20/2013   COPD (chronic obstructive pulmonary disease) ([place]) 10/20/2013    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   Hemoptysis 05/08/2013   Dyspnea 04/30/2013   Abnormal uterine bleeding (AUB) 04/13/2013   Pulmonary mass 04/13/2013   CARBUNCLE AND FURUNCLE OF TRUNK 01/12/2010   DEPRESSION 11/29/2009   BREAST MASS, LEFT 11/29/2009   BACK PAIN, LUMBAR 11/29/2009   Human immunodeficiency virus (HIV) disease ([place]) 10/26/2009   SMOKER 10/26/2009   COPD GOLD III 10/26/2009   COCAINE ABUSE, HX OF 10/26/2009    Past Surgical History:  Procedure Laterality Date   BREAST SURGERY Left    mass removed from left breast-fatty tissue   CESAREAN SECTION  40yr ago   [place]WEDGE RESECTION Right 07/05/2013   Procedure: VIDEO ASSISTED THORACOSCOPY (VATS)/WEDGE RESECTION, Right Upper Lobectomy with lymph node disecction and OnQ placement;  Surgeon: SMelrose Nakayama MD;  [facility]  Service: Thoracic;  Laterality: Right;  (R)VATS, WEDGE RESECTION, POSSIBLE LOBECTOMY, POSSIBLE CHEST WALL RESECTION   VIDEO BRONCHOSCOPY Bilateral 05/05/2013   Procedure: VIDEO BRONCHOSCOPY WITH FLUORO;  Surgeon: MTanda Rockers MD;  [facility];  Service: Cardiopulmonary;  Laterality: Bilateral;     OB History   No obstetric history on file.      Home Medications    Prior to Admission  medications   Medication Sig Start Date End Date Taking? Authorizing Provider  hydrOXYzine (ATARAX/VISTARIL) 25 MG tablet Take 1 tablet by mouth 3 (three) times daily as needed. 05/20/18  Yes [provider]  Vitamin D, Ergocalciferol, (DRISDOL) 1.25 MG (50000 UT) CAPS capsule Take 1 capsule by mouth once a week. 06/18/18  Yes [provider]  albuterol (PROVENTIL HFA;VENTOLIN HFA) 108 (90 BASE) MCG/ACT inhaler Inhale 2 puffs into the lungs every 6 (six) hours as needed for wheezing or shortness of breath.    [provider]  albuterol (PROVENTIL) (2.5 MG/3ML) 0.083% nebulizer solution Take 6 mLs (5 mg total) by  nebulization every 6 (six) hours as needed for wheezing or shortness of breath. 10/28/13   Kirichenko, [place] Rocker, PA-C  atorvastatin (LIPITOR) 20 MG tablet Take 20 mg by mouth daily.  03/19/18   [provider]  divalproex (DEPAKOTE) 250 MG DR tablet Take 250-500 mg by mouth 2 (two) times daily. 250 mg in the am and 500 in the pm    [provider]  gabapentin (NEURONTIN) 400 MG capsule Take 800 mg by mouth 3 (three) times daily. 09/14/18   [provider]  GENVOYA 150-150-200-10 MG TABS tablet Take 1 tablet by mouth every morning.  03/19/18   [provider]  HYDROcodone-acetaminophen (NORCO/VICODIN) 5-325 MG tablet Take 2 tablets by mouth every 4 (four) hours as needed. 03/29/18   Fransico Meadow, PA-C  ipratropium-albuterol (DUONEB) 0.5-2.5 (3) MG/3ML SOLN Inhale 3 mLs into the lungs every 6 (six) hours as needed (for shortness of breath-wheezing).  03/19/18   [provider]  levETIRAcetam (KEPPRA) 750 MG tablet Take 750 mg by mouth 2 (two) times daily.  03/19/18   [provider]  omeprazole (PRILOSEC) 20 MG capsule Take 1 capsule by mouth daily. 12/17/18   [provider]  pantoprazole (PROTONIX) 40 MG tablet Take 40 mg by mouth 2 (two) times daily. 11/08/18   [provider]  predniSONE (DELTASONE) 50 MG tablet Take 1 tablet (50 mg total) by mouth daily for 5 days. 01/02/19 01/07/19  Assad Harbeson S, PA-C  pregabalin (LYRICA) 75 MG capsule Take 1 capsule by mouth 3 (three) times daily. 10/01/18   [provider]  rOPINIRole (REQUIP) 1 MG tablet Take 1 mg by mouth at bedtime.  03/19/18   [provider]  SPIRIVA HANDIHALER 18 MCG inhalation capsule Place 18 mcg into inhaler and inhale daily.  03/19/18   [provider]  sucralfate (CARAFATE) 1 g tablet Take 1 g by mouth 4 (four) times daily.     [provider]  SYMBICORT 160-4.5 MCG/ACT inhaler Inhale 2 puffs into the lungs 2 (two) times daily.   03/19/18   [provider]  venlafaxine (EFFEXOR) 37.5 MG tablet Take 1 tablet by mouth 2 (two) times a day. 10/21/18   [provider]    Family History Family History  Problem Relation Age of Onset   Diabetes Mother    CAD Mother    Hypertension Mother    Hypertension Sister    Lung cancer Maternal Aunt        smoked   Allergies Sister    Asthma Sister    Lung cancer Maternal Uncle        never smoker   Lung cancer Maternal Grandmother        never smoker    Social History Social History   Tobacco Use   Smoking status: Current Every Day Smoker  Packs/day: 2.00    Years: 24.00    Pack years: 48.00    Types: Cigarettes    Last attempt to quit: 11/01/2013    Years since quitting: 5.1   Smokeless tobacco: Never Used  Substance Use Topics   Alcohol use: No    Alcohol/week: 0.0 standard drinks   Drug use: No    Types: "Crack" cocaine    Comment: drug relapse 05/2014, clean x 3 months 09/08/14- 03/28/18 pt reports in recovery x 2 years at this time.      Allergies   Gabapentin, Penicillins, Ibuprofen, and Tramadol   Review of Systems Review of Systems  Constitutional: Negative for fever.  HENT: Negative for congestion.   Respiratory: Positive for cough.   Cardiovascular:       Chest tightness  Gastrointestinal: Negative for abdominal pain, nausea and vomiting.  Genitourinary: Negative for dysuria.  Musculoskeletal:       Elbow pain  Skin: Negative for wound.  Neurological:       No head injury or loc     Physical Exam Updated Vital Signs BP 123/90    Pulse 99    Temp 98.6 F (37 C) (Oral)    Resp 20    Ht 5' (1.524 m)    Wt 66.7 kg    SpO2 (!) 88%    BMI 28.71 kg/m   Physical Exam Vitals signs and nursing note reviewed.  Constitutional:      General: She is not in acute distress.    Appearance: She is well-developed.  HENT:     Head: Normocephalic and atraumatic.  Eyes:     Conjunctiva/sclera: Conjunctivae normal.    Neck:     Musculoskeletal: Neck supple.  Cardiovascular:     Rate and Rhythm: Regular rhythm.     Heart sounds: No murmur.     Comments: Marginally tachycardic Pulmonary:     Effort: Pulmonary effort is normal. Prolonged expiration present. No respiratory distress.     Breath sounds: Examination of the right-upper field reveals wheezing. Examination of the left-upper field reveals wheezing. Examination of the right-middle field reveals wheezing. Examination of the left-middle field reveals wheezing. Examination of the right-lower field reveals wheezing. Examination of the left-lower field reveals wheezing. Wheezing present. No decreased breath sounds, rhonchi or rales.  Abdominal:     General: Bowel sounds are normal.     Palpations: Abdomen is soft.     Tenderness: There is no abdominal tenderness.  Musculoskeletal:     Comments: TTP to the elbow and distal humerus. Ecchymosis and swelling to the elbow noted.  Skin:    General: Skin is warm and dry.  Neurological:     Mental Status: She is alert.      ED Treatments / Results  Labs (all labs ordered are listed, but only abnormal results are displayed) Labs Reviewed - No data to display  EKG None  Radiology Dg Elbow Complete Left  Result Date: 01/02/2019 CLINICAL DATA:  FALL, LEFT ELBOW PAIN, PER ER NOTE, Pt c/o pain to left elbow after fall last evening after she tripped. Pt's elbow is swollen and bruised. Denies loss of consciousness HISTORY OF CANCER, HIV, SEIZURES, ASTHMA fall EXAM: LEFT ELBOW - COMPLETE 3+ VIEW COMPARISON:  None. FINDINGS: No evidence of fracture of the ulna or humerus. The radial head is normal. No joint effusion. IMPRESSION: No fracture or dislocation. Electronically Signed   By: Suzy Bouchard M.D.   On: 01/02/2019 14:48    Procedures Procedures (  including critical care time)  Medications Ordered in ED Medications  acetaminophen (TYLENOL) tablet 650 mg (650 mg Oral Refused 01/02/19 1407)  predniSONE  (DELTASONE) tablet 60 mg (60 mg Oral Given 01/02/19 1408)  albuterol (VENTOLIN HFA) 108 (90 Base) MCG/ACT inhaler 8 puff (8 puffs Inhalation Given 01/02/19 1409)  AeroChamber Plus Flo-Vu Medium MISC 1 each ( Other Given 01/02/19 1409)  ketorolac (TORADOL) 30 MG/ML injection 30 mg (30 mg Intramuscular Given 01/02/19 1443)     Initial Impression / Assessment and Plan / ED Course  I have reviewed the triage vital signs and the nursing notes.  Pertinent labs & imaging results that were available during my care of the patient were reviewed by me and considered in my medical decision making (see chart for details).   Final Clinical Impressions(s) / ED Diagnoses   Final diagnoses:  Fall, initial encounter  Left elbow pain  Chronic obstructive pulmonary disease, unspecified COPD type ([place])   48 y/o female presenting to the ED for eval of mechanical fall yesterday. C/o left elbow pain. Denies head trauma or LOC. Has also had some cough as she has not been using her nebs or oxygen at home.  Lungs with wheezing throughout all lung fields. Left elbow with TTP and ecchymosis.   Xray left elbow w/o evidence of fracture.   Pt given albuterol and prednisone. On reassessment, she is resting comfortably. Lung sounds have improved. Oxygen remains stable on her home 2L. Will have her rotate otc meds for her elbow pain. Her wheezing is likely related to medication noncompliance however will give prednisone given her wheezing today as she will likely benefit from this. She does not have fevers and this is less likely infectious. Will have her f/u and return if worse. She voices understanding and is in agreement with plan. All questions answered. Pt stable for d.c   ED Discharge Orders         Ordered    predniSONE (DELTASONE) 50 MG tablet  Daily     01/02/19 [address], Daven Pinckney S, PA-C 01/02/19 1522    Maudie Flakes, MD 01/07/19 [phone]

## 2019-01-02 NOTE — Discharge Instructions (Addendum)
Take the prednisone for your COPD as directed.   You may take over the counter tylenol or motrin for your elbow pain.   Please follow up with your primary care provider within 5-7 days for re-evaluation of your symptoms. If you do not have a primary care provider, information for a healthcare clinic has been provided for you to make arrangements for follow up care. Please return to the emergency department for any new or worsening symptoms.

## 2019-08-13 ENCOUNTER — Emergency Department (HOSPITAL_COMMUNITY)
Admission: EM | Admit: 2019-08-13 | Discharge: 2019-08-13 | Discharge status: Against Medical Advice | Payer: Medicaid Other | Attending: Emergency Medicine | Admitting: Emergency Medicine

## 2019-08-13 ENCOUNTER — Emergency Department (HOSPITAL_COMMUNITY): Payer: Medicaid Other

## 2019-08-13 ENCOUNTER — Other Ambulatory Visit: Payer: Self-pay

## 2019-08-13 ENCOUNTER — Encounter (HOSPITAL_COMMUNITY): Payer: Self-pay | Admitting: *Deleted

## 2019-08-13 DIAGNOSIS — Z5320 Procedure and treatment not carried out because of patient's decision for unspecified reasons: Secondary | ICD-10-CM | POA: Diagnosis not present

## 2019-08-13 DIAGNOSIS — R0602 Shortness of breath: Secondary | ICD-10-CM | POA: Diagnosis present

## 2019-08-13 DIAGNOSIS — Z85118 Personal history of other malignant neoplasm of bronchus and lung: Secondary | ICD-10-CM | POA: Insufficient documentation

## 2019-08-13 DIAGNOSIS — J449 Chronic obstructive pulmonary disease, unspecified: Secondary | ICD-10-CM | POA: Diagnosis not present

## 2019-08-13 DIAGNOSIS — B20 Human immunodeficiency virus [HIV] disease: Secondary | ICD-10-CM | POA: Insufficient documentation

## 2019-08-13 DIAGNOSIS — F17210 Nicotine dependence, cigarettes, uncomplicated: Secondary | ICD-10-CM | POA: Insufficient documentation

## 2019-08-13 DIAGNOSIS — F1110 Opioid abuse, uncomplicated: Secondary | ICD-10-CM | POA: Insufficient documentation

## 2019-08-13 DIAGNOSIS — R531 Weakness: Secondary | ICD-10-CM | POA: Insufficient documentation

## 2019-08-13 DIAGNOSIS — Z79899 Other long term (current) drug therapy: Secondary | ICD-10-CM | POA: Diagnosis not present

## 2019-08-13 DIAGNOSIS — R079 Chest pain, unspecified: Secondary | ICD-10-CM | POA: Insufficient documentation

## 2019-08-13 DIAGNOSIS — R071 Chest pain on breathing: Secondary | ICD-10-CM | POA: Insufficient documentation

## 2019-08-13 HISTORY — DX: Chronic obstructive pulmonary disease, unspecified: J44.9

## 2019-08-13 LAB — CBC
HCT: 34.5 % — ABNORMAL LOW (ref 36.0–46.0)
Hemoglobin: 10.6 g/dL — ABNORMAL LOW (ref 12.0–15.0)
MCH: 27.7 pg (ref 26.0–34.0)
MCHC: 30.7 g/dL (ref 30.0–36.0)
MCV: 90.1 fL (ref 80.0–100.0)
Platelets: 225 10*3/uL (ref 150–400)
RBC: 3.83 MIL/uL — ABNORMAL LOW (ref 3.87–5.11)
RDW: 16.9 % — ABNORMAL HIGH (ref 11.5–15.5)
WBC: 10.7 10*3/uL — ABNORMAL HIGH (ref 4.0–10.5)
nRBC: 0 % (ref 0.0–0.2)

## 2019-08-13 LAB — RAPID URINE DRUG SCREEN, HOSP PERFORMED
Amphetamines: NOT DETECTED
Barbiturates: NOT DETECTED
Benzodiazepines: NOT DETECTED
Cocaine: NOT DETECTED
Opiates: POSITIVE — AB
Tetrahydrocannabinol: NOT DETECTED

## 2019-08-13 LAB — BASIC METABOLIC PANEL
Anion gap: 8 (ref 5–15)
BUN: 14 mg/dL (ref 6–20)
CO2: 25 mmol/L (ref 22–32)
Calcium: 8.3 mg/dL — ABNORMAL LOW (ref 8.9–10.3)
Chloride: 101 mmol/L (ref 98–111)
Creatinine, Ser: 0.8 mg/dL (ref 0.44–1.00)
GFR calc Af Amer: >60 mL/min (ref >60)
GFR calc non Af Amer: >60 mL/min (ref >60)
Glucose, Bld: 157 mg/dL — ABNORMAL HIGH (ref 70–99)
Potassium: 3.8 mmol/L (ref 3.5–5.1)
Sodium: 134 mmol/L — ABNORMAL LOW (ref 135–145)

## 2019-08-13 LAB — D-DIMER, QUANTITATIVE: D-Dimer, Quant: 0.45 ug/mL-FEU (ref 0.00–0.50)

## 2019-08-13 LAB — TROPONIN I (HIGH SENSITIVITY)
Troponin I (High Sensitivity): 19 ng/L — ABNORMAL HIGH (ref <18)
Troponin I (High Sensitivity): 20 ng/L — ABNORMAL HIGH (ref <18)

## 2019-08-13 LAB — TSH: TSH: 1.194 u[IU]/mL (ref 0.350–4.500)

## 2019-08-13 LAB — BRAIN NATRIURETIC PEPTIDE: B Natriuretic Peptide: 233 pg/mL — ABNORMAL HIGH (ref 0.0–100.0)

## 2019-08-13 MED ORDER — ALBUTEROL SULFATE HFA 108 (90 BASE) MCG/ACT IN AERS
2.0000 | INHALATION_SPRAY | Freq: Once | RESPIRATORY_TRACT | Status: AC
  Administered 2019-08-13: 2 via RESPIRATORY_TRACT
  Filled 2019-08-13: qty 6.7

## 2019-08-13 MED ORDER — LORAZEPAM 1 MG PO TABS
1.0000 mg | ORAL_TABLET | Freq: Once | ORAL | Status: AC
  Administered 2019-08-13: 1 mg via ORAL
  Filled 2019-08-13: qty 1

## 2019-08-13 MED ORDER — SODIUM CHLORIDE 0.9% FLUSH
3.0000 mL | Freq: Once | INTRAVENOUS | Status: AC
  Administered 2019-08-13: 3 mL via INTRAVENOUS

## 2019-08-13 NOTE — ED Triage Notes (Signed)
Pt c/o mid chest pain, SOB, weakness that started yesterday. Pt reports she wears O2 at home but arrives at ED without it. O2 sat 77% on RA. Pt reports she only has 1 lung. HR 134.

## 2019-08-13 NOTE — ED Notes (Signed)
Pt assisted to restroom.  

## 2019-08-13 NOTE — ED Notes (Signed)
Family updated as to patient's status.

## 2019-08-13 NOTE — ED Notes (Signed)
To radiology

## 2019-08-13 NOTE — ED Notes (Signed)
After dr Roslynn Amble in with pt to discuss admisson, pt assisted to dress .pt adamant that she is not going to stay. Pt remains very sleepy and nods off but will then wake up and answer questions . Up to wheelchair with minimal assist. Pt's daughter here to pick pt up. Discussed with daughter that the doctor wanted her to stay due to the sleepiness/lethargy but pt refused. Pt again at this time states she is not staying at the hospital. Instructed daughter to return with pt or call 911 if symptoms worsen or do not get any better. Daughter verbalizes understanding .
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
## 2019-08-13 NOTE — Discharge Instructions (Signed)
We recommended remaining in the emergency department for further observation given your fast heart rate and breathing.  If you change your mind, you have any worsening of your condition, please return to ER for reassessment.  Otherwise recommend follow-up with your primary doctor closely ideally to be seen on Monday or Tuesday of next week.

## 2019-08-15 ENCOUNTER — Encounter (HOSPITAL_COMMUNITY): Payer: Self-pay | Admitting: Emergency Medicine

## 2019-08-15 ENCOUNTER — Inpatient Hospital Stay (HOSPITAL_COMMUNITY): Payer: Medicaid Other

## 2019-08-15 ENCOUNTER — Inpatient Hospital Stay (HOSPITAL_COMMUNITY)
Admission: EM | Admit: 2019-08-15 | Discharge: 2019-08-26 | DRG: 917 | Disposition: A | Discharge status: Home Health | Payer: Medicaid Other | Attending: Internal Medicine | Admitting: Internal Medicine

## 2019-08-15 ENCOUNTER — Other Ambulatory Visit: Payer: Self-pay

## 2019-08-15 DIAGNOSIS — D72829 Elevated white blood cell count, unspecified: Secondary | ICD-10-CM | POA: Diagnosis present

## 2019-08-15 DIAGNOSIS — G40909 Epilepsy, unspecified, not intractable, without status epilepticus: Secondary | ICD-10-CM | POA: Diagnosis present

## 2019-08-15 DIAGNOSIS — J690 Pneumonitis due to inhalation of food and vomit: Secondary | ICD-10-CM | POA: Diagnosis present

## 2019-08-15 DIAGNOSIS — T50901A Poisoning by unspecified drugs, medicaments and biological substances, accidental (unintentional), initial encounter: Secondary | ICD-10-CM | POA: Diagnosis present

## 2019-08-15 DIAGNOSIS — I248 Other forms of acute ischemic heart disease: Secondary | ICD-10-CM | POA: Diagnosis present

## 2019-08-15 DIAGNOSIS — J45909 Unspecified asthma, uncomplicated: Secondary | ICD-10-CM | POA: Diagnosis present

## 2019-08-15 DIAGNOSIS — R0902 Hypoxemia: Secondary | ICD-10-CM

## 2019-08-15 DIAGNOSIS — E870 Hyperosmolality and hypernatremia: Secondary | ICD-10-CM | POA: Diagnosis not present

## 2019-08-15 DIAGNOSIS — I081 Rheumatic disorders of both mitral and tricuspid valves: Secondary | ICD-10-CM | POA: Diagnosis present

## 2019-08-15 DIAGNOSIS — Z8249 Family history of ischemic heart disease and other diseases of the circulatory system: Secondary | ICD-10-CM

## 2019-08-15 DIAGNOSIS — T402X1A Poisoning by other opioids, accidental (unintentional), initial encounter: Principal | ICD-10-CM | POA: Diagnosis present

## 2019-08-15 DIAGNOSIS — G43909 Migraine, unspecified, not intractable, without status migrainosus: Secondary | ICD-10-CM | POA: Diagnosis present

## 2019-08-15 DIAGNOSIS — G92 Toxic encephalopathy: Secondary | ICD-10-CM | POA: Diagnosis present

## 2019-08-15 DIAGNOSIS — K7201 Acute and subacute hepatic failure with coma: Secondary | ICD-10-CM | POA: Diagnosis not present

## 2019-08-15 DIAGNOSIS — R680 Hypothermia, not associated with low environmental temperature: Secondary | ICD-10-CM | POA: Diagnosis present

## 2019-08-15 DIAGNOSIS — I361 Nonrheumatic tricuspid (valve) insufficiency: Secondary | ICD-10-CM | POA: Diagnosis not present

## 2019-08-15 DIAGNOSIS — J9601 Acute respiratory failure with hypoxia: Secondary | ICD-10-CM | POA: Diagnosis present

## 2019-08-15 DIAGNOSIS — M6282 Rhabdomyolysis: Secondary | ICD-10-CM | POA: Diagnosis present

## 2019-08-15 DIAGNOSIS — R7989 Other specified abnormal findings of blood chemistry: Secondary | ICD-10-CM | POA: Diagnosis present

## 2019-08-15 DIAGNOSIS — G9340 Encephalopathy, unspecified: Secondary | ICD-10-CM | POA: Diagnosis not present

## 2019-08-15 DIAGNOSIS — Z21 Asymptomatic human immunodeficiency virus [HIV] infection status: Secondary | ICD-10-CM | POA: Diagnosis present

## 2019-08-15 DIAGNOSIS — Z20822 Contact with and (suspected) exposure to covid-19: Secondary | ICD-10-CM | POA: Diagnosis present

## 2019-08-15 DIAGNOSIS — T424X1A Poisoning by benzodiazepines, accidental (unintentional), initial encounter: Secondary | ICD-10-CM | POA: Diagnosis present

## 2019-08-15 DIAGNOSIS — Z85118 Personal history of other malignant neoplasm of bronchus and lung: Secondary | ICD-10-CM

## 2019-08-15 DIAGNOSIS — Y848 Other medical procedures as the cause of abnormal reaction of the patient, or of later complication, without mention of misadventure at the time of the procedure: Secondary | ICD-10-CM | POA: Diagnosis not present

## 2019-08-15 DIAGNOSIS — J95851 Ventilator associated pneumonia: Secondary | ICD-10-CM | POA: Diagnosis present

## 2019-08-15 DIAGNOSIS — R778 Other specified abnormalities of plasma proteins: Secondary | ICD-10-CM | POA: Diagnosis not present

## 2019-08-15 DIAGNOSIS — R7401 Elevation of levels of liver transaminase levels: Secondary | ICD-10-CM

## 2019-08-15 DIAGNOSIS — F1910 Other psychoactive substance abuse, uncomplicated: Secondary | ICD-10-CM | POA: Diagnosis not present

## 2019-08-15 DIAGNOSIS — T50902A Poisoning by unspecified drugs, medicaments and biological substances, intentional self-harm, initial encounter: Secondary | ICD-10-CM

## 2019-08-15 DIAGNOSIS — T50904A Poisoning by unspecified drugs, medicaments and biological substances, undetermined, initial encounter: Secondary | ICD-10-CM | POA: Diagnosis not present

## 2019-08-15 DIAGNOSIS — Z79899 Other long term (current) drug therapy: Secondary | ICD-10-CM

## 2019-08-15 DIAGNOSIS — Z825 Family history of asthma and other chronic lower respiratory diseases: Secondary | ICD-10-CM

## 2019-08-15 DIAGNOSIS — F17210 Nicotine dependence, cigarettes, uncomplicated: Secondary | ICD-10-CM | POA: Diagnosis present

## 2019-08-15 DIAGNOSIS — I214 Non-ST elevation (NSTEMI) myocardial infarction: Secondary | ICD-10-CM | POA: Diagnosis present

## 2019-08-15 DIAGNOSIS — T40604S Poisoning by unspecified narcotics, undetermined, sequela: Secondary | ICD-10-CM

## 2019-08-15 DIAGNOSIS — I10 Essential (primary) hypertension: Secondary | ICD-10-CM | POA: Diagnosis present

## 2019-08-15 DIAGNOSIS — Z7951 Long term (current) use of inhaled steroids: Secondary | ICD-10-CM

## 2019-08-15 DIAGNOSIS — Z801 Family history of malignant neoplasm of trachea, bronchus and lung: Secondary | ICD-10-CM

## 2019-08-15 DIAGNOSIS — F329 Major depressive disorder, single episode, unspecified: Secondary | ICD-10-CM | POA: Diagnosis present

## 2019-08-15 DIAGNOSIS — R4182 Altered mental status, unspecified: Secondary | ICD-10-CM | POA: Diagnosis present

## 2019-08-15 DIAGNOSIS — K7200 Acute and subacute hepatic failure without coma: Secondary | ICD-10-CM | POA: Diagnosis present

## 2019-08-15 DIAGNOSIS — N179 Acute kidney failure, unspecified: Secondary | ICD-10-CM | POA: Diagnosis present

## 2019-08-15 DIAGNOSIS — J9600 Acute respiratory failure, unspecified whether with hypoxia or hypercapnia: Secondary | ICD-10-CM

## 2019-08-15 DIAGNOSIS — J441 Chronic obstructive pulmonary disease with (acute) exacerbation: Secondary | ICD-10-CM | POA: Diagnosis present

## 2019-08-15 DIAGNOSIS — T40601A Poisoning by unspecified narcotics, accidental (unintentional), initial encounter: Secondary | ICD-10-CM | POA: Diagnosis present

## 2019-08-15 LAB — COMPREHENSIVE METABOLIC PANEL
ALT: 2490 U/L — ABNORMAL HIGH (ref 0–44)
AST: 3821 U/L — ABNORMAL HIGH (ref 15–41)
Albumin: 3.5 g/dL (ref 3.5–5.0)
Alkaline Phosphatase: 162 U/L — ABNORMAL HIGH (ref 38–126)
Anion gap: 12 (ref 5–15)
BUN: 58 mg/dL — ABNORMAL HIGH (ref 6–20)
CO2: 25 mmol/L (ref 22–32)
Calcium: 7.8 mg/dL — ABNORMAL LOW (ref 8.9–10.3)
Chloride: 100 mmol/L (ref 98–111)
Creatinine, Ser: 3.43 mg/dL — ABNORMAL HIGH (ref 0.44–1.00)
GFR calc Af Amer: 18 mL/min — ABNORMAL LOW (ref >60)
GFR calc non Af Amer: 15 mL/min — ABNORMAL LOW (ref >60)
Glucose, Bld: 122 mg/dL — ABNORMAL HIGH (ref 70–99)
Potassium: 4.5 mmol/L (ref 3.5–5.1)
Sodium: 137 mmol/L (ref 135–145)
Total Bilirubin: 1.6 mg/dL — ABNORMAL HIGH (ref 0.3–1.2)
Total Protein: 6.2 g/dL — ABNORMAL LOW (ref 6.5–8.1)

## 2019-08-15 LAB — URINALYSIS, ROUTINE W REFLEX MICROSCOPIC
Bilirubin Urine: NEGATIVE
Glucose, UA: NEGATIVE mg/dL
Ketones, ur: NEGATIVE mg/dL
Leukocytes,Ua: NEGATIVE
Nitrite: NEGATIVE
Protein, ur: 30 mg/dL — AB
Specific Gravity, Urine: 1.015 (ref 1.005–1.030)
pH: 5 (ref 5.0–8.0)

## 2019-08-15 LAB — TROPONIN I (HIGH SENSITIVITY)
Troponin I (High Sensitivity): 2703 ng/L (ref <18)
Troponin I (High Sensitivity): 2241 ng/L (ref <18)

## 2019-08-15 LAB — RAPID URINE DRUG SCREEN, HOSP PERFORMED
Amphetamines: NOT DETECTED
Barbiturates: NOT DETECTED
Benzodiazepines: POSITIVE — AB
Cocaine: NOT DETECTED
Opiates: POSITIVE — AB
Tetrahydrocannabinol: NOT DETECTED

## 2019-08-15 LAB — RESPIRATORY PANEL BY RT PCR (FLU A&B, COVID)
Influenza A by PCR: NEGATIVE
Influenza B by PCR: NEGATIVE
SARS Coronavirus 2 by RT PCR: NEGATIVE

## 2019-08-15 LAB — CBC WITH DIFFERENTIAL/PLATELET
Abs Immature Granulocytes: 0.11 10*3/uL — ABNORMAL HIGH (ref 0.00–0.07)
Basophils Absolute: 0 10*3/uL (ref 0.0–0.1)
Basophils Relative: 0 %
Eosinophils Absolute: 0 10*3/uL (ref 0.0–0.5)
Eosinophils Relative: 0 %
HCT: 36 % (ref 36.0–46.0)
Hemoglobin: 11.2 g/dL — ABNORMAL LOW (ref 12.0–15.0)
Immature Granulocytes: 1 %
Lymphocytes Relative: 14 %
Lymphs Abs: 1.7 10*3/uL (ref 0.7–4.0)
MCH: 27.1 pg (ref 26.0–34.0)
MCHC: 31.1 g/dL (ref 30.0–36.0)
MCV: 87 fL (ref 80.0–100.0)
Monocytes Absolute: 0.4 10*3/uL (ref 0.1–1.0)
Monocytes Relative: 3 %
Neutro Abs: 9.8 10*3/uL — ABNORMAL HIGH (ref 1.7–7.7)
Neutrophils Relative %: 82 %
Platelets: 175 10*3/uL (ref 150–400)
RBC: 4.14 MIL/uL (ref 3.87–5.11)
RDW: 18.3 % — ABNORMAL HIGH (ref 11.5–15.5)
WBC: 12 10*3/uL — ABNORMAL HIGH (ref 4.0–10.5)
nRBC: 0.3 % — ABNORMAL HIGH (ref 0.0–0.2)

## 2019-08-15 LAB — MAGNESIUM: Magnesium: 2.6 mg/dL — ABNORMAL HIGH (ref 1.7–2.4)

## 2019-08-15 LAB — BLOOD GAS, ARTERIAL
Acid-base deficit: 0.1 mmol/L (ref 0.0–2.0)
Bicarbonate: 24 mmol/L (ref 20.0–28.0)
FIO2: 50
O2 Saturation: 90.1 %
Patient temperature: 37.6
pCO2 arterial: 45.9 mmHg (ref 32.0–48.0)
pH, Arterial: 7.351 (ref 7.350–7.450)
pO2, Arterial: 69.8 mmHg — ABNORMAL LOW (ref 83.0–108.0)

## 2019-08-15 LAB — I-STAT BETA HCG BLOOD, ED (MC, WL, AP ONLY): I-stat hCG, quantitative: <5 m[IU]/mL (ref <5)

## 2019-08-15 LAB — SALICYLATE LEVEL: Salicylate Lvl: <7 mg/dL — ABNORMAL LOW (ref 7.0–30.0)

## 2019-08-15 LAB — ACETAMINOPHEN LEVEL: Acetaminophen (Tylenol), Serum: <10 ug/mL — ABNORMAL LOW (ref 10–30)

## 2019-08-15 LAB — VALPROIC ACID LEVEL: Valproic Acid Lvl: <10 ug/mL — ABNORMAL LOW (ref 50.0–100.0)

## 2019-08-15 LAB — LACTIC ACID, PLASMA: Lactic Acid, Venous: 2 mmol/L (ref 0.5–1.9)

## 2019-08-15 LAB — PROTIME-INR
INR: 1.6 — ABNORMAL HIGH (ref 0.8–1.2)
Prothrombin Time: 18.6 seconds — ABNORMAL HIGH (ref 11.4–15.2)

## 2019-08-15 LAB — AMMONIA: Ammonia: 45 umol/L — ABNORMAL HIGH (ref 9–35)

## 2019-08-15 LAB — CK: Total CK: 6072 U/L — ABNORMAL HIGH (ref 38–234)

## 2019-08-15 LAB — ETHANOL: Alcohol, Ethyl (B): <10 mg/dL (ref <10)

## 2019-08-15 LAB — GLUCOSE, CAPILLARY: Glucose-Capillary: 108 mg/dL — ABNORMAL HIGH (ref 70–99)

## 2019-08-15 MED ORDER — ZIPRASIDONE MESYLATE 20 MG IM SOLR
INTRAMUSCULAR | Status: AC
  Administered 2019-08-15: 20 mg via INTRAMUSCULAR
  Filled 2019-08-15: qty 20

## 2019-08-15 MED ORDER — FENTANYL CITRATE (PF) 100 MCG/2ML IJ SOLN
100.0000 ug | INTRAMUSCULAR | Status: DC | PRN
  Administered 2019-08-15 – 2019-08-25 (×27): 100 ug via INTRAVENOUS
  Filled 2019-08-15 (×28): qty 2

## 2019-08-15 MED ORDER — LORAZEPAM 2 MG/ML IJ SOLN
INTRAMUSCULAR | Status: AC
  Administered 2019-08-15: 2 mg via INTRAVENOUS
  Filled 2019-08-15: qty 1

## 2019-08-15 MED ORDER — SODIUM CHLORIDE 0.9 % IV BOLUS
1000.0000 mL | Freq: Once | INTRAVENOUS | Status: AC
  Administered 2019-08-15: 1000 mL via INTRAVENOUS

## 2019-08-15 MED ORDER — PANTOPRAZOLE SODIUM 40 MG IV SOLR
40.0000 mg | Freq: Every day | INTRAVENOUS | Status: DC
  Administered 2019-08-15 – 2019-08-18 (×4): 40 mg via INTRAVENOUS
  Filled 2019-08-15 (×4): qty 40

## 2019-08-15 MED ORDER — STERILE WATER FOR INJECTION IJ SOLN
INTRAMUSCULAR | Status: AC
  Administered 2019-08-15: 1.2 mL
  Filled 2019-08-15: qty 10

## 2019-08-15 MED ORDER — ONDANSETRON HCL 4 MG/2ML IJ SOLN
4.0000 mg | Freq: Four times a day (QID) | INTRAMUSCULAR | Status: DC | PRN
  Administered 2019-08-21: 21:00:00 4 mg via INTRAVENOUS
  Filled 2019-08-15: qty 2

## 2019-08-15 MED ORDER — PROPOFOL 1000 MG/100ML IV EMUL
INTRAVENOUS | Status: AC
  Administered 2019-08-15: 5 ug/kg/min via INTRAVENOUS
  Filled 2019-08-15: qty 100

## 2019-08-15 MED ORDER — MIDAZOLAM HCL 5 MG/5ML IJ SOLN
INTRAMUSCULAR | Status: AC
  Administered 2019-08-15: 4 mg via INTRAVENOUS
  Filled 2019-08-15: qty 5

## 2019-08-15 MED ORDER — IPRATROPIUM-ALBUTEROL 0.5-2.5 (3) MG/3ML IN SOLN
3.0000 mL | RESPIRATORY_TRACT | Status: DC | PRN
  Administered 2019-08-18 – 2019-08-23 (×3): 3 mL via RESPIRATORY_TRACT
  Filled 2019-08-15 (×3): qty 3

## 2019-08-15 MED ORDER — ACETYLCYSTEINE LOAD VIA INFUSION
150.0000 mg/kg | Freq: Once | INTRAVENOUS | Status: AC
  Administered 2019-08-15: 9525 mg via INTRAVENOUS
  Filled 2019-08-15: qty 239

## 2019-08-15 MED ORDER — ORAL CARE MOUTH RINSE
15.0000 mL | OROMUCOSAL | Status: DC
  Administered 2019-08-15 – 2019-08-18 (×26): 15 mL via OROMUCOSAL

## 2019-08-15 MED ORDER — PROPOFOL 1000 MG/100ML IV EMUL
5.0000 ug/kg/min | INTRAVENOUS | Status: DC
  Administered 2019-08-15: 35 ug/kg/min via INTRAVENOUS
  Administered 2019-08-16: 60 ug/kg/min via INTRAVENOUS
  Administered 2019-08-16: 30 ug/kg/min via INTRAVENOUS
  Administered 2019-08-16: 40 ug/kg/min via INTRAVENOUS
  Administered 2019-08-16 – 2019-08-17 (×2): 60 ug/kg/min via INTRAVENOUS
  Administered 2019-08-17: 40 ug/kg/min via INTRAVENOUS
  Administered 2019-08-17 (×2): 50 ug/kg/min via INTRAVENOUS
  Administered 2019-08-17: 60 ug/kg/min via INTRAVENOUS
  Administered 2019-08-18: 30 ug/kg/min via INTRAVENOUS
  Filled 2019-08-15 (×12): qty 100

## 2019-08-15 MED ORDER — HEPARIN SODIUM (PORCINE) 5000 UNIT/ML IJ SOLN
5000.0000 [IU] | Freq: Three times a day (TID) | INTRAMUSCULAR | Status: DC
  Administered 2019-08-15 – 2019-08-26 (×32): 5000 [IU] via SUBCUTANEOUS
  Filled 2019-08-15 (×33): qty 1

## 2019-08-15 MED ORDER — ETOMIDATE 2 MG/ML IV SOLN
20.0000 mg | Freq: Once | INTRAVENOUS | Status: AC
  Administered 2019-08-15: 20 mg via INTRAVENOUS

## 2019-08-15 MED ORDER — CHLORHEXIDINE GLUCONATE CLOTH 2 % EX PADS
6.0000 | MEDICATED_PAD | Freq: Every day | CUTANEOUS | Status: DC
  Administered 2019-08-15 – 2019-08-20 (×4): 6 via TOPICAL

## 2019-08-15 MED ORDER — SUCCINYLCHOLINE CHLORIDE 20 MG/ML IJ SOLN
150.0000 mg | Freq: Once | INTRAMUSCULAR | Status: AC
  Administered 2019-08-15: 150 mg via INTRAVENOUS

## 2019-08-15 MED ORDER — SODIUM CHLORIDE 0.9 % IV SOLN
750.0000 mg | Freq: Two times a day (BID) | INTRAVENOUS | Status: DC
  Administered 2019-08-16 (×2): 750 mg via INTRAVENOUS
  Filled 2019-08-15 (×3): qty 7.5

## 2019-08-15 MED ORDER — SODIUM CHLORIDE 0.9 % IV SOLN: INTRAVENOUS | Status: DC

## 2019-08-15 MED ORDER — MIDAZOLAM HCL 5 MG/5ML IJ SOLN: 4.0000 mg | Freq: Once | INTRAMUSCULAR | Status: AC

## 2019-08-15 MED ORDER — LORAZEPAM 2 MG/ML IJ SOLN: 2.0000 mg | Freq: Once | INTRAMUSCULAR | Status: AC

## 2019-08-15 MED ORDER — ZIPRASIDONE MESYLATE 20 MG IM SOLR: 20.0000 mg | Freq: Once | INTRAMUSCULAR | Status: AC

## 2019-08-15 MED ORDER — SODIUM CHLORIDE 0.9 % IV SOLN
250.0000 mL | INTRAVENOUS | Status: DC
  Administered 2019-08-20: 09:00:00 250 mL via INTRAVENOUS

## 2019-08-15 MED ORDER — DEXTROSE 5 % IV SOLN
15.0000 mg/kg/h | INTRAVENOUS | Status: DC
  Administered 2019-08-15 – 2019-08-16 (×2): 15 mg/kg/h via INTRAVENOUS
  Filled 2019-08-15 (×3): qty 120

## 2019-08-15 MED ORDER — CHLORHEXIDINE GLUCONATE 0.12% ORAL RINSE (MEDLINE KIT)
15.0000 mL | Freq: Two times a day (BID) | OROMUCOSAL | Status: DC
  Administered 2019-08-15 – 2019-08-18 (×6): 15 mL via OROMUCOSAL

## 2019-08-15 NOTE — ED Notes (Addendum)
Poison Control notified per Dr. Stark Jock of possible overdose on Oxycodone and Gabapentin. Gina from [place] recommended to watch for CNS and respiratory depression, draw lab levels for Magnesium, INR and ammonia, start Acetylcysteine 171m/kg/hr x 1 hour then transition to maintenance Acetylcysteine at 171mkg/hr x 23 hours. LFTs and INR to be rechecked at hour 22 of Acetylcysteine infusion. If LFTs are decreasing and INR is less than 2 at the 22 hour Acetylcysteine infusion lab draw then the Acetylcysteine can be stopped, otherwise it will need to be continued for another 24 hours.  Recommendations for Acetylcysteine will be faxed over to [place]. GiBarnett Applebaumeports she will call back later to check on pt. Dr. DeStark Jockotified.

## 2019-08-15 NOTE — ED Notes (Signed)
IVC paperwork faxed to the magistrate.

## 2019-08-15 NOTE — Progress Notes (Signed)
MEDICATION RELATED CONSULT NOTE - FOLLOW UP   Pharmacy Consult for Acetylcysteine IV Indication: Tylenol overdose  Allergies  Allergen Reactions  . Penicillins Itching and Other (See Comments)  . Ibuprofen Other (See Comments)  . Tramadol Rash and Other (See Comments)    headaches     Patient Measurements:     Vital Signs: Temp: 99.6 F (37.6 C) (03/14 0955) Temp Source: Oral (03/14 0955) BP: 137/95 (03/14 1300) Pulse Rate: 107 (03/14 1300) Intake/Output from previous day: No intake/output data recorded. Intake/Output from this shift: Total I/O In: 1000 [IV Piggyback:1000] Out: -   Labs: Recent Labs    08/13/19 1710 08/15/19 1053  WBC 10.7* 12.0*  HGB 10.6* 11.2*  HCT 34.5* 36.0  PLT 225 175  CREATININE 0.80 3.43*  ALBUMIN  --  3.5  PROT  --  6.2*  AST  --  3,821*  ALT  --  2,490*  ALKPHOS  --  162*  BILITOT  --  1.6*   Estimated Creatinine Clearance: 16.9 mL/min (A) (by C-G formula based on SCr of 3.43 mg/dL (H)).   Microbiology: No results found for this or any previous visit (from the past 720 hour(s)).  Medications:  (Not in a hospital admission)   Assessment: Patient presents with possible overdose of oxycodone and gabapentin.  LFT's elevated with ALT of 2490 and AST 3821.   Plan:  Acetylcysteine 150 mg/kg x 1 over 60 minutes followed by acetylcsteine 15 mg/kg/hr Monitor LFT, serum APAP, INR  Margot Ables, PharmD Clinical Pharmacist 08/15/2019 1:14 PM

## 2019-08-15 NOTE — ED Notes (Signed)
Per [facility] Department, they cannot serve the IVC paperwork across [employer]. IVC paperwork was faxed to the magistrate at 1630, but pt was never served the papers. The magistrate that came on duty at 2000 took the paperwork to the [facility] office, but [employer] refused to serve. According to [employer], if the pt needs to be IVC'd a physician in [facility] will have to take papers out on the pt and [employer] will have to serve the pt. Due to this situation, pt is not IVC'd at this time.

## 2019-08-15 NOTE — ED Triage Notes (Signed)
pts daughter called EMS for overdose. Pt had oxycodone and gabapentin filled 08/09/19. Half of bottles taken. HX of drug use.  Pt unable to answer questions. Pt screaming and thrashing in bed.

## 2019-08-15 NOTE — ED Provider Notes (Signed)
Please see Dr. Estanislado Pandy note for full H&P.   Procedures Procedure Name: Intubation Date/Time: 08/15/2019 2:33 PM Performed by: Amaryllis Dyke, PA-C Pre-anesthesia Checklist: Patient identified, Patient being monitored, Emergency Drugs available, Timeout performed and Suction available Oxygen Delivery Method: Non-rebreather mask Preoxygenation: Pre-oxygenation with 100% oxygen Induction Type: Rapid sequence Ventilation: Mask ventilation without difficulty Laryngoscope Size: Glidescope and 3 Grade View: Grade I Tube size: 7.0 mm Number of attempts: 1 Airway Equipment and Method: Stylet Placement Confirmation: ETT inserted through vocal cords under direct vision,  CO2 detector and Breath sounds checked- equal and bilateral Secured at: 23 cm Tube secured with: ETT holder Dental Injury: Teeth and Oropharynx as per pre-operative assessment         Amaryllis Dyke, PA-C 08/15/19 1435    Veryl Speak, MD 08/16/19 2319
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
## 2019-08-15 NOTE — ED Notes (Signed)
[facility] notified of transport

## 2019-08-15 NOTE — H&P (Addendum)
NAME:  Elizabeth [name], MRN:  [id number], DOB:  01-31-1971, LOS: 0 ADMISSION DATE:  08/15/2019, CONSULTATION DATE:  08/15/19 REFERRING MD: [facility], CHIEF COMPLAINT:  Altered mental status  Brief History   49 year old woman with a history of HIV, COPD, seizure d/o, polysubstance abuse, with new AMS.  Daughter called EMS for overdose, found pill bottles half empty after new prescription filled 08/09/19. To [facility] ED.  Benzos and opiates pos on utox at 1230  History of present illness   Initially was agitated and combative, screaming on presentation this AM at [facility] ED, disoriented.   Given Geodon in ED.   Became more somnolent as the day progressed and was intubated for airway protection prior to transfer/  Poison control recommended acetylcysteine  Past Medical History  HIV (stribild) COPD Depression (trazodone, seroquel) Migraines Seizures (depakote, last seizure 01/02/19) Hx lumpectomy?  Hx of VATS for wedge resection 2015  Meds listed: albuterol, duonebs, lipitor, depakote 250 am, 500pm, gabapentin, genvoya, norco, hydroxazine 25 TID prn, keppra 724m po BID, Requip 154mqhs, lyrica 75 TID, protonix, omeprazole, spiriva 1876mdaily, sucralfate TID, Symbicort 2p BID, effexor 37.5 mg BID,   Significant Hospital Events   Intubated at 230 pm   Consults:  Poison control   Procedures:  Intubation  Significant Diagnostic Tests:  CT head: 1. Normal CT appearance of the brain. 2. Extensive soft tissue prominence within the nasal cavity may represent polyps or diffuse nasal mucosal swelling. 3. Chronic right maxillary sinus disease.  CXR IMPRESSION: 1. Endotracheal tube and nasogastric tube in good position. 2. No acute cardiopulmonary disease.   Micro Data:    Antimicrobials:    Interim history/subjective:    Objective   Blood pressure 104/77, pulse (!) 120, temperature (!) 97.2 F (36.2 C), resp. rate (!) 22, height 5' (1.524 m), SpO2 100 %.    Vent Mode: PRVC FiO2 (%):   [50 %-80 %] 80 % Set Rate:  [18 bmp] 18 bmp Vt Set:  [400 mL] 400 mL PEEP:  [5 cmH20] 5 cmH20 Plateau Pressure:  [14 cmH20-18 cmH20] 18 cmH20   Intake/Output Summary (Last 24 hours) at 08/15/2019 2223 Last data filed at 08/15/2019 2200 Gross per 24 hour  Intake 2610.14 ml  Output 125 ml  Net 2485.14 ml   There were no vitals filed for this visit.  Examination: General: NAD sedated on vent HENT: Pupils 1-2 mm, minimally reactive Lungs: CTAB Cardiovascular: RRR no mgr Abdomen: NT, ND, NBS Extremities: No edema, no cyanosis or clubbing  Neuro: not responsve to me, no withdrawal to pain GU: foley, draining (yellow, about 200 cc in bag)  Resolved Hospital Problem list     Assessment & Plan:  Acute encephalopathy: likely 2/2 drug overdose.   Oxycodone and gabapentin bottles empty after recent refill per report. Patient also had newly elevated LFTs, has a prescription for norco, so was started on N-acetylcysteine (?).  [facility]  Intubated at 230 PM.  Started propofol  Ativan at 1230pm, geodon 66m33m at 10am   "[facility] to watch for CNS and respiratory depression, draw lab levels for Magnesium, INR and ammonia, start Acetylcysteine 150mg29mhr x 1 hour then transition to maintenance Acetylcysteine at 15mg/73mr x 23 hours. LFTs and INR to be rechecked at hour 22 of Acetylcysteine infusion. If LFTs are decreasing and INR is less than 2 at the 22 hour Acetylcysteine infusion lab draw then the Acetylcysteine can be stopped,  otherwise it will need to be continued for another 24 hours. " Maintenance fluids Started acetylcysteine at 1254 (bolus followed by gtt).   NS 200/hr 2.6 L NS given by time of tx.   Mg 2.6  Ammonia 45 [] repeat CK  VPA, salicylate, acetaminophen all below threshold levels.   Transaminitis: possibly medication related? Poison control rec Acetylcysteine infusion, which was started.  INR 1.6, ammonia  46  Acute Liver failure.   Rhabdomyolysis:  Elevated CK.  AKI Cont fluids.  Follow up repeat CK.   Seizures history:  keppra BID (will give IV for now) Hold depakote?  Does she actually take this? Level non detectable.   Hypoxia/large A-a gradient.  PaO2 69.8 at 430pm (fio2 50%) AM CXR  Does nto seem c/w PE based on hx and risk.  Improved FIO2 and sat this evening.  (100% on 40%).  Aspiration pneumonitis? Monitor for signs of infection  Echo    Mild Leukocytosis (12) Mild hypothermia (97.5).  TSH recently normal.  Blood cx, procal p.    AKI;  Cr 3.43 uop 225  In 3 hrs.  CK >6000  NSTEMI  Trop 2703 --> 2241 Monitor EKG not remarkable for STEMI. Will monitor, repeat EKG.     Best practice:  Diet: npo Pain/Anxiety/Delirium protocol (if indicated): fent prn for signs of withdrawal or pain, propofol (coughing gagging on vent when decreased) VAP protocol (if indicated): yes DVT prophylaxis: held given elevated inr GI prophylaxis: protonix Glucose control: ISS Mobility: bed Code Status: Full  Family Communication: Called daughter, left message askig for call back. Patient stable from prior to tx.  Disposition:   Labs   CBC: Recent Labs  Lab 08/13/19 1710 08/15/19 1053  WBC 10.7* 12.0*  NEUTROABS  --  9.8*  HGB 10.6* 11.2*  HCT 34.5* 36.0  MCV 90.1 87.0  PLT 225 993    Basic Metabolic Panel: Recent Labs  Lab 08/13/19 1710 08/15/19 1053 08/15/19 1227  NA 134* 137  --   K 3.8 4.5  --   CL 101 100  --   CO2 25 25  --   GLUCOSE 157* 122*  --   BUN 14 58*  --   CREATININE 0.80 3.43*  --   CALCIUM 8.3* 7.8*  --   MG  --   --  2.6*   GFR: Estimated Creatinine Clearance: 16.9 mL/min (A) (by C-G formula based on SCr of 3.43 mg/dL (H)). Recent Labs  Lab 08/13/19 1710 08/15/19 1053  WBC 10.7* 12.0*  LATICACIDVEN  --  2.0*    Liver Function Tests: Recent Labs  Lab 08/15/19 1053  AST 3,821*  ALT 2,490*  ALKPHOS 162*  BILITOT 1.6*  PROT 6.2*   ALBUMIN 3.5   No results for input(s): LIPASE, AMYLASE in the last 168 hours. Recent Labs  Lab 08/15/19 1227  AMMONIA 45*    ABG    Component Value Date/Time   PHART 7.351 08/15/2019 1630   PCO2ART 45.9 08/15/2019 1630   PO2ART 69.8 (L) 08/15/2019 1630   HCO3 24.0 08/15/2019 1630   TCO2 29.3 07/06/2013 0425   ACIDBASEDEF 0.1 08/15/2019 1630   O2SAT 90.1 08/15/2019 1630     Coagulation Profile: Recent Labs  Lab 08/15/19 1227  INR 1.6*    Cardiac Enzymes: Recent Labs  Lab 08/15/19 1413  CKTOTAL 6,072*    HbA1C: No results found for: HGBA1C  CBG: Recent Labs  Lab 08/15/19 2111  GLUCAP 108*    Review of Systems:  unablet o assess  Past Medical History  She,  has a past medical history of Asthma, COPD (chronic obstructive pulmonary disease) ([place]), Depression, Dizziness, Dysphagia, Headache(784.0), History of migraine, HIV (human immunodeficiency virus infection) ([place]), Lung cancer ([place]), Nocturia, Seizures ([place]), Shortness of breath, Substance abuse ([place]), and Urinary frequency.   Surgical History    Past Surgical History:  Procedure Laterality Date  . BREAST SURGERY Left    mass removed from left breast-fatty tissue  . CESAREAN SECTION  36yr ago  . VIDEO ASSISTED THORACOSCOPY (VATS)/WEDGE RESECTION Right 07/05/2013   Procedure: VIDEO ASSISTED THORACOSCOPY (VATS)/WEDGE RESECTION, Right Upper Lobectomy with lymph node disecction and OnQ placement;  Surgeon: SMelrose Nakayama MD;  [facility]  Service: Thoracic;  Laterality: Right;  (R)VATS, WEDGE RESECTION, POSSIBLE LOBECTOMY, POSSIBLE CHEST WALL RESECTION  . VIDEO BRONCHOSCOPY Bilateral 05/05/2013   Procedure: VIDEO BRONCHOSCOPY WITH FLUORO;  Surgeon: MTanda Rockers MD;  Location: [facility];  Service: Cardiopulmonary;  Laterality: Bilateral;     Social History   reports that she has been smoking cigarettes. She has a 48.00 pack-year smoking history. She has never used smokeless tobacco. She  reports that she does not drink alcohol or use drugs.   Family History   Her family history includes Allergies in her sister; Asthma in her sister; CAD in her mother; Diabetes in her mother; Hypertension in her mother and sister; Lung cancer in her maternal aunt, maternal grandmother, and maternal uncle.   Allergies Allergies  Allergen Reactions  . Penicillins Itching and Other (See Comments)  . Ibuprofen Other (See Comments)  . Morphine   . Cariprazine Anxiety    Pt states she has hallucinations and suicidal thoughts when taking this medicine  Pt states she has hallucinations and suicidal thoughts when taking this medicine    . Tramadol Rash and Other (See Comments)    headaches      Home Medications  Prior to Admission medications   Medication Sig Start Date End Date Taking? Authorizing Provider  albuterol (PROVENTIL HFA;VENTOLIN HFA) 108 (90 BASE) MCG/ACT inhaler Inhale 2 puffs into the lungs every 6 (six) hours as needed for wheezing or shortness of breath.    [provider]  albuterol (PROVENTIL) (2.5 MG/3ML) 0.083% nebulizer solution Take 6 mLs (5 mg total) by nebulization every 6 (six) hours as needed for wheezing or shortness of breath. 10/28/13   Kirichenko, TLahoma Rocker PA-C  atorvastatin (LIPITOR) 20 MG tablet Take 20 mg by mouth daily.  03/19/18   [provider]  divalproex (DEPAKOTE) 250 MG DR tablet Take 250-500 mg by mouth 2 (two) times daily. 250 mg in the am and 500 in the pm    [provider]  gabapentin (NEURONTIN) 400 MG capsule Take 800 mg by mouth 3 (three) times daily. 09/14/18   [provider]  GENVOYA 150-150-200-10 MG TABS tablet Take 1 tablet by mouth every morning.  03/19/18   [provider]  HYDROcodone-acetaminophen (NORCO/VICODIN) 5-325 MG tablet Take 2 tablets by mouth every 4 (four) hours as needed. 03/29/18   SFransico Meadow PA-C  hydrOXYzine (ATARAX/VISTARIL) 25 MG tablet Take 1 tablet by mouth 3 (three)  times daily as needed. 05/20/18   [provider]  ipratropium-albuterol (DUONEB) 0.5-2.5 (3) MG/3ML SOLN Inhale 3 mLs into the lungs every 6 (six) hours as needed (for shortness of breath-wheezing).  03/19/18   [provider]  levETIRAcetam (KEPPRA) 750 MG tablet Take 750 mg by mouth 2 (two) times daily.  03/19/18  [provider]  omeprazole (PRILOSEC) 20 MG capsule Take 1 capsule by mouth daily. 12/17/18   [provider]  pantoprazole (PROTONIX) 40 MG tablet Take 40 mg by mouth 2 (two) times daily. 11/08/18   [provider]  pregabalin (LYRICA) 75 MG capsule Take 1 capsule by mouth 3 (three) times daily. 10/01/18   [provider]  rOPINIRole (REQUIP) 1 MG tablet Take 1 mg by mouth at bedtime.  03/19/18   [provider]  SPIRIVA HANDIHALER 18 MCG inhalation capsule Place 18 mcg into inhaler and inhale daily.  03/19/18   [provider]  sucralfate (CARAFATE) 1 g tablet Take 1 g by mouth 4 (four) times daily.     [provider]  SYMBICORT 160-4.5 MCG/ACT inhaler Inhale 2 puffs into the lungs 2 (two) times daily.  03/19/18   [provider]  venlafaxine (EFFEXOR) 37.5 MG tablet Take 1 tablet by mouth 2 (two) times a day. 10/21/18   [provider]  Vitamin D, Ergocalciferol, (DRISDOL) 1.25 MG (50000 UT) CAPS capsule Take 1 capsule by mouth once a week. 06/18/18   [provider]     Critical care time: 60 min     Addend: consulted with [facility] ICU for possible tx for liver failure.  No beds at this time, but she is on the pending list.  No further management recs aside from current supportive plan. Low threshold for abtx.  Given recent large A-a gradient, possible aspiration, hypothermia, mildly elevated WBC, will start zosyn for now. Blood cultures pending, ordered sputum.

## 2019-08-15 NOTE — ED Notes (Signed)
Pt's family at bedside. Questions answered. Informed pt's meds were in pharmacy and they instructed to leave there as they did not want them. Silver colored cross given to daughter, Caryl Pina. Caryl Pina chose to leave pt's 2 silver rings on right hand.

## 2019-08-15 NOTE — Progress Notes (Addendum)
Patient arrived at [facility] from [facility] without complications. Patient is on the ventilator, propofol gtt, and acetadote gtt.   Vital signs:  HR: Sinus Tach 100s BP: 120s/80s  Patient has two daughters, Elizabeth [name] (primary support person) and other. Elizabeth [name] states that the other daughter impersonates Elizabeth [name] and requests that information only be given to Elizabeth [name]. Pass code is "marshmallow"

## 2019-08-15 NOTE — ED Notes (Signed)
Pt intubated with 7 ET tube with assistance of glidescope. Positive color change. Breath sounds auscultated in all lung fields by Dr. Stark Jock. ET tube secured at 23cm at lip. Pt intubated by Aldona Bar, PA.

## 2019-08-15 NOTE — Progress Notes (Signed)
eLink Physician-Brief Progress Note Patient Name: Elizabeth [name] DOB: August 14, 1970 MRN: [id number]   Date of Service  08/15/2019  HPI/Events of Note  New Patient Evaluation.  18F with HIV (on Stribild), COPD, seizure d/o, and hx of polysubstance abuse who was BIBA with AMS i/s/o suspected polysubstance overdose after daughter called EMS.  Agitated and screaming in [facility] ED, disoriented, given Geodon. More somnolent throughout day and intubated for airway protection prior to transfer.  Benzos/opiates positive on UTox. Per home medication list, she had access to gabapentin / opioids / valproate / keppra / venlafaxine / ropinirole. Poison control contacted by bedside team and recs provided.  Trop elevated, with normal EKG aside from ST. Myocarditis vs NSTEMI.  LFTs and ammonia + TBili and INR elevated, with corresponding encephalopathy, concerning for fulminant liver failure. CK elevated.  So far, tox screen unrevealing in so far as agents that could cause liver failure. Valproate level and acetaminophen level all undetectable. EtOH and salicylate level also undetectable.  Renal failure present. Appears to be fairly acute onset.   eICU Interventions  # NEURO # ? Toxic ingestion: Opioid and benzodiazepine levels elevated but no other toxins identified so far. Nevertheless has elevated trop, e/o fulminant liver failure. - Follow poison control recs: includes NAC infusion and LFT/INR monitoring as outlined in H&P note.  # GI # Concern for fulminant liver failure: Elevated ammonia, high LFTs, encephalopathy, albeit unclear precipitant. - Dr. Patsey Berthold will place referral to liver center.  # RESPIRATORY - Mechanical ventilation. Current settings appropriate based on ABG. Settings: Vent Mode: PRVC FiO2 (%):  [40 %-80 %] 40 % Set Rate:  [18 bmp] 18 bmp Vt Set:  [400 mL] 400 mL PEEP:  [5 cmH20] Glide Pressure:  [14 TKW40-97 cmH20] 18 cmH20 - CXR not significantly changed from 03/28/2018  film, though given her hypoxemia I will send PCP DFA from tracheal aspirate. - Overall, suspect hypoxemia more likely from sterile aspiration i/s/o encephalopathy. Rare to have HPS in acute liver failure so this is less likely. - F/u trach aspirate culture.  # CARDIAC: EKG unchanged from prior and without e/o ischemic changes, however troponin elevated. ? Myocarditis or resolving NSTEMI. - Trend troponins. - Q4H EKG for monitoring s/p presumed polysubstance ingestion with access to velafaxine.  # ID: - F/u PCP DFA and resp culture (trach aspirate) - ID consult in AM given HIV and complex presentation.  # Renal: - IVF @ 200cc/hr in setting of ?  rhabdomyolysis vs CK elevation secondary to ALT elevation, though if poor UOP response will need to revisit this. - Trend CK levels. - Consult nephrology in AM. - Foley for strict I/O monitoring.  DVT PPX: Heparin [place] Q8H. GI PPX: Protonix 75m IV daily.      Intervention Category Evaluation Type: New Patient Evaluation  RCharlott Rakes3/14/2021, 11:15 PM

## 2019-08-15 NOTE — ED Notes (Signed)
Pt's belongings placed in lockers -  1. Shirt 2. Medication bag with medications (controlled substances counted and given to pharmacy) 3. 3 Katerine Morua colored rings with clear stones 4. 1 Burnell Hurta colored ring with blue color wrapped around it 5. 1 Henny Strauch colored necklace with heart shaped charm 6. 1 Ellianah Cordy colored necklace with charm with pink stone

## 2019-08-15 NOTE — ED Notes (Signed)
Date and time results received: 08/15/19  (use smartphrase ".now" to insert current time)  Test: Lactic Critical Value: 2  Name of Provider Notified: Delo  Orders Received? Or Actions Taken?: Orders Received - See Orders for details

## 2019-08-16 ENCOUNTER — Inpatient Hospital Stay (HOSPITAL_COMMUNITY): Payer: Medicaid Other

## 2019-08-16 ENCOUNTER — Encounter (HOSPITAL_COMMUNITY): Payer: Self-pay | Admitting: Pulmonary Disease

## 2019-08-16 DIAGNOSIS — I361 Nonrheumatic tricuspid (valve) insufficiency: Secondary | ICD-10-CM

## 2019-08-16 DIAGNOSIS — J9601 Acute respiratory failure with hypoxia: Secondary | ICD-10-CM

## 2019-08-16 LAB — PROTIME-INR
INR: 1.6 — ABNORMAL HIGH (ref 0.8–1.2)
Prothrombin Time: 19.4 seconds — ABNORMAL HIGH (ref 11.4–15.2)

## 2019-08-16 LAB — POCT I-STAT 7, (LYTES, BLD GAS, ICA,H+H)
Acid-base deficit: 4 mmol/L — ABNORMAL HIGH (ref 0.0–2.0)
Bicarbonate: 22.1 mmol/L (ref 20.0–28.0)
Calcium, Ion: 1.19 mmol/L (ref 1.15–1.40)
HCT: 27 % — ABNORMAL LOW (ref 36.0–46.0)
Hemoglobin: 9.2 g/dL — ABNORMAL LOW (ref 12.0–15.0)
O2 Saturation: 95 %
Patient temperature: 37
Potassium: 3.8 mmol/L (ref 3.5–5.1)
Sodium: 142 mmol/L (ref 135–145)
TCO2: 23 mmol/L (ref 22–32)
pCO2 arterial: 42.1 mmHg (ref 32.0–48.0)
pH, Arterial: 7.328 — ABNORMAL LOW (ref 7.350–7.450)
pO2, Arterial: 83 mmHg (ref 83.0–108.0)

## 2019-08-16 LAB — COMPREHENSIVE METABOLIC PANEL
ALT: 1807 U/L — ABNORMAL HIGH (ref 0–44)
AST: 1660 U/L — ABNORMAL HIGH (ref 15–41)
Albumin: 2.3 g/dL — ABNORMAL LOW (ref 3.5–5.0)
Alkaline Phosphatase: 124 U/L (ref 38–126)
Anion gap: 17 — ABNORMAL HIGH (ref 5–15)
BUN: 54 mg/dL — ABNORMAL HIGH (ref 6–20)
CO2: 18 mmol/L — ABNORMAL LOW (ref 22–32)
Calcium: 8 mg/dL — ABNORMAL LOW (ref 8.9–10.3)
Chloride: 110 mmol/L (ref 98–111)
Creatinine, Ser: 3.36 mg/dL — ABNORMAL HIGH (ref 0.44–1.00)
GFR calc Af Amer: 18 mL/min — ABNORMAL LOW (ref >60)
GFR calc non Af Amer: 15 mL/min — ABNORMAL LOW (ref >60)
Glucose, Bld: 96 mg/dL (ref 70–99)
Potassium: 4.3 mmol/L (ref 3.5–5.1)
Sodium: 145 mmol/L (ref 135–145)
Total Bilirubin: 1.4 mg/dL — ABNORMAL HIGH (ref 0.3–1.2)
Total Protein: 4.6 g/dL — ABNORMAL LOW (ref 6.5–8.1)

## 2019-08-16 LAB — GLUCOSE, CAPILLARY
Glucose-Capillary: 105 mg/dL — ABNORMAL HIGH (ref 70–99)
Glucose-Capillary: 110 mg/dL — ABNORMAL HIGH (ref 70–99)
Glucose-Capillary: 133 mg/dL — ABNORMAL HIGH (ref 70–99)
Glucose-Capillary: 85 mg/dL (ref 70–99)
Glucose-Capillary: 95 mg/dL (ref 70–99)
Glucose-Capillary: 96 mg/dL (ref 70–99)
Glucose-Capillary: 96 mg/dL (ref 70–99)

## 2019-08-16 LAB — CK TOTAL AND CKMB (NOT AT ARMC)
CK, MB: 31.7 ng/mL — ABNORMAL HIGH (ref 0.5–5.0)
Relative Index: 1 (ref 0.0–2.5)
Total CK: 3036 U/L — ABNORMAL HIGH (ref 38–234)

## 2019-08-16 LAB — ACETAMINOPHEN LEVEL: Acetaminophen (Tylenol), Serum: <10 ug/mL — ABNORMAL LOW (ref 10–30)

## 2019-08-16 LAB — ECHOCARDIOGRAM COMPLETE
Height: 60 in
Weight: 2328.06 oz

## 2019-08-16 LAB — SODIUM, URINE, RANDOM: Sodium, Ur: 102 mmol/L

## 2019-08-16 LAB — CBC
HCT: 35.6 % — ABNORMAL LOW (ref 36.0–46.0)
Hemoglobin: 11.1 g/dL — ABNORMAL LOW (ref 12.0–15.0)
MCH: 26.9 pg (ref 26.0–34.0)
MCHC: 31.2 g/dL (ref 30.0–36.0)
MCV: 86.4 fL (ref 80.0–100.0)
Platelets: 149 10*3/uL — ABNORMAL LOW (ref 150–400)
RBC: 4.12 MIL/uL (ref 3.87–5.11)
RDW: 17.9 % — ABNORMAL HIGH (ref 11.5–15.5)
WBC: 8.5 10*3/uL (ref 4.0–10.5)
nRBC: 0 % (ref 0.0–0.2)

## 2019-08-16 LAB — HEMOGLOBIN A1C
Hgb A1c MFr Bld: 5.8 % — ABNORMAL HIGH (ref 4.8–5.6)
Mean Plasma Glucose: 119.76 mg/dL

## 2019-08-16 LAB — PHOSPHORUS
Phosphorus: 4.9 mg/dL — ABNORMAL HIGH (ref 2.5–4.6)
Phosphorus: 4.2 mg/dL (ref 2.5–4.6)
Phosphorus: 4.1 mg/dL (ref 2.5–4.6)

## 2019-08-16 LAB — MAGNESIUM
Magnesium: 2.6 mg/dL — ABNORMAL HIGH (ref 1.7–2.4)
Magnesium: 2.3 mg/dL (ref 1.7–2.4)
Magnesium: 2.3 mg/dL (ref 1.7–2.4)

## 2019-08-16 LAB — LACTIC ACID, PLASMA: Lactic Acid, Venous: 1.6 mmol/L (ref 0.5–1.9)

## 2019-08-16 LAB — PROCALCITONIN: Procalcitonin: 7.01 ng/mL

## 2019-08-16 LAB — CREATININE, URINE, RANDOM: Creatinine, Urine: 60.19 mg/dL

## 2019-08-16 LAB — MRSA PCR SCREENING: MRSA by PCR: NEGATIVE

## 2019-08-16 MED ORDER — SODIUM CHLORIDE 0.9 % IV SOLN
2.0000 g | INTRAVENOUS | Status: DC
  Administered 2019-08-16: 2 g via INTRAVENOUS
  Filled 2019-08-16 (×2): qty 2

## 2019-08-16 MED ORDER — LEVETIRACETAM IN NACL 500 MG/100ML IV SOLN
500.0000 mg | Freq: Two times a day (BID) | INTRAVENOUS | Status: DC
  Administered 2019-08-16 – 2019-08-17 (×3): 500 mg via INTRAVENOUS
  Filled 2019-08-16 (×3): qty 100

## 2019-08-16 MED ORDER — METHYLPREDNISOLONE SODIUM SUCC 40 MG IJ SOLR
40.0000 mg | Freq: Every day | INTRAMUSCULAR | Status: DC
  Administered 2019-08-16 – 2019-08-19 (×4): 40 mg via INTRAVENOUS
  Filled 2019-08-16 (×6): qty 1

## 2019-08-16 MED ORDER — SODIUM CHLORIDE 0.9 % IV SOLN
2.0000 g | INTRAVENOUS | Status: AC
  Administered 2019-08-16 – 2019-08-22 (×7): 2 g via INTRAVENOUS
  Filled 2019-08-16 (×6): qty 20
  Filled 2019-08-16: qty 2

## 2019-08-16 MED ORDER — INSULIN ASPART 100 UNIT/ML ~~LOC~~ SOLN
0.0000 [IU] | SUBCUTANEOUS | Status: DC
  Administered 2019-08-17 – 2019-08-20 (×11): 1 [IU] via SUBCUTANEOUS
  Administered 2019-08-21: 2 [IU] via SUBCUTANEOUS
  Administered 2019-08-21 – 2019-08-22 (×6): 1 [IU] via SUBCUTANEOUS
  Administered 2019-08-22: 3 [IU] via SUBCUTANEOUS
  Administered 2019-08-23: 2 [IU] via SUBCUTANEOUS
  Administered 2019-08-23 – 2019-08-26 (×6): 1 [IU] via SUBCUTANEOUS

## 2019-08-16 MED ORDER — BUDESONIDE 0.5 MG/2ML IN SUSP
0.5000 mg | Freq: Two times a day (BID) | RESPIRATORY_TRACT | Status: DC
  Administered 2019-08-16 – 2019-08-23 (×14): 0.5 mg via RESPIRATORY_TRACT
  Filled 2019-08-16 (×14): qty 2

## 2019-08-16 MED ORDER — IPRATROPIUM-ALBUTEROL 0.5-2.5 (3) MG/3ML IN SOLN
3.0000 mL | Freq: Four times a day (QID) | RESPIRATORY_TRACT | Status: DC
  Administered 2019-08-16 – 2019-08-21 (×22): 3 mL via RESPIRATORY_TRACT
  Filled 2019-08-16 (×22): qty 3

## 2019-08-16 MED ORDER — VITAL AF 1.2 CAL PO LIQD
1000.0000 mL | ORAL | Status: DC
  Administered 2019-08-16 – 2019-08-18 (×3): 1000 mL

## 2019-08-16 MED ORDER — PRO-STAT SUGAR FREE PO LIQD
30.0000 mL | Freq: Two times a day (BID) | ORAL | Status: DC
  Administered 2019-08-16: 30 mL
  Filled 2019-08-16: qty 30

## 2019-08-16 MED ORDER — METRONIDAZOLE IN NACL 5-0.79 MG/ML-% IV SOLN
500.0000 mg | Freq: Three times a day (TID) | INTRAVENOUS | Status: DC
  Administered 2019-08-16 (×2): 500 mg via INTRAVENOUS
  Filled 2019-08-16 (×2): qty 100

## 2019-08-16 MED ORDER — VITAL HIGH PROTEIN PO LIQD
1000.0000 mL | ORAL | Status: DC
  Administered 2019-08-16: 1000 mL

## 2019-08-16 NOTE — Progress Notes (Signed)
 NAME:  Elizabeth [name], MRN:  [id number], DOB:  01/19/1971, LOS: 1 ADMISSION DATE:  08/15/2019, CONSULTATION DATE:  08/15/19 REFERRING MD: [facility] ED, CHIEF COMPLAINT:  Altered mental status  Brief History   49 year old woman with a history of HIV, COPD, seizure d/o, polysubstance abuse, with new AMS.  Daughter called EMS for suspected overdose, found pill bottles half empty after new prescription filled 08/09/19. To [facility]. Benzos and opiates pos on urine tox at 1230.  Given Geodon in ER, developed progressive somnolence > intubated.  Tx to [facility].   Past Medical History  HIV (stribild) COPD Depression (trazodone, seroquel) Migraines Seizures (depakote, last seizure 01/02/19) Hx lumpectomy?  Hx of VATS for wedge resection 2015  Significant Hospital Events   3/14 Admit with suspected overose   Consults:  Poison Control   Procedures:  ETT 3/14 >>   Significant Diagnostic Tests:   CT Head 3/14 >> Normal CT appearance of the brain. Extensive soft tissue prominence within the nasal cavity may represent polyps or diffuse nasal mucosal swelling. Chronic right maxillary sinus disease.  ECHO 3/15 >>   Micro Data:  COVID 3/14 >> negative  Influenza A/B 3/14 >> negative  MRSA PCR 3/14 >> negative  Tracheal Aspirate 3/15 >>  PCP Smear 3/15 >>  BCx2 3/15 >>   Antimicrobials:  Cefepime 3/15 >> 3/15 Flagyl 3/15 >> 3/15  Ceftriaxone 3/15 >>  Interim history/subjective:  Remains on vent - FiO2 40%, PEEP 5 Glucose range 85-95 Tmax 100.2 / WBC 8.5 I/O 565 ml UOP since admit, +695ml for 24h RN reports pt wakes from sedation and is agitated, difficult to re-sedate  Objective   Blood pressure (!) 154/89, pulse (!) 117, temperature 100.2 F (37.9 C), temperature source Bladder, resp. rate (!) 24, height 5' (1.524 m), weight 66 kg, SpO2 97 %.    Vent Mode: CPAP;PSV FiO2 (%):  [40 %-80 %] 40 % Set Rate:  [18 bmp] 18 bmp Vt Set:  [400 mL] 400 mL PEEP:  [5 cmH20] 5 cmH20 Pressure Support:   [10 cmH20] 10 cmH20 Plateau Pressure:  [10 cmH20-18 cmH20] 17 cmH20   Intake/Output Summary (Last 24 hours) at 08/16/2019 0949 Last data filed at 08/16/2019 0934 Gross per 24 hour  Intake 5199.09 ml  Output 630 ml  Net 4569.09 ml   Filed Weights   08/15/19 2200 08/16/19 0500  Weight: 66 kg 66 kg    Examination: General: aduly female lying in bed on vent in NAD  HEENT: MM pink/moist, ETT, anicteric Neuro: sedate, sedation paused during exam > pt coughing, moves all extremities CV: s1s2 RRR, no m/r/g PULM:  Non-labored on vent, lungs bilaterally with diffuse wheezing  GI: soft, bsx4 active  Extremities: warm/dry, no edema  Skin: no rashes or lesions  Resolved Hospital Problem list     Assessment & Plan:   Acute Metabolic Encephalopathy  Suspect in setting of overdose.  No hx of suicidality.  Oxycodone, gabapentin bottles empty after recent refill.  Elevated LFT's (on norco).  Ammonia 45, VPA, salicylate, acetaminophen negative -continue N-acetylcysteine per protocol > 15mg/kg/hr x 23 hours. -Poison Control involved in patients care > rec's for following Mg+, INR, ammonia -INR to be rechecked at hour 22 of infusion.  If LFT's are decreasing and INR <2 at 22h, infusion can be stopped -minimize sedating medications -early PT / OT efforts  -will need PSY evaluation once extubated  -PAD protocol for RASS of 0 to -1   Transaminitis  Acute Liver   Failure  Possibly medication related?  -continue Poison Control recommendations as above  -await AM labs -[facility] ICU aware of possible transfer for liver failure     Rhabdomyolysis AKI  -follow CK Q12, improving  -aggressive IVF resuscitation   Hx Seizures  -continue keppra BID  -? If patient actually takes depakote, levels undetectable on admit  -seizure precautions   Acute Hypoxemic Respiratory Failure  Suspected Aspiration Pneumonitis Bronchospasm   Hypoxia / Large A-a Gradient. PaO2 69.8 at 430pm (fio2 50%).  Improved O2  needs.  -PRVC 8cc/kg as rest mode  -PSV wean as tolerated -wean PEEP / FiO2 for sats >90% -change abx to empiric rocephin for aspiration coverage  -add solumedrol 40 mg IV x5 days  -add Duoneb Q6 + BID pulmicort  -await ECHO   Mild Leukocytosis   Mild hypothermia (97.5).   TSH recently normal.  -follow cultures, PTC  NSTEMI  Trop 2703 --> 2241.  EKG without ST changes  -follow troponin  -follow telemetry  At Risk Malnutrition  -TF per protocol   Best practice:  Diet: TF  Pain/Anxiety/Delirium protocol (if indicated): PAD protocol  VAP protocol (if indicated): yes DVT prophylaxis: SCD's  GI prophylaxis: PPI  Glucose control: SSI Mobility: BR Code Status: Full Code Family Communication: Will update family on arrival Disposition: ICU  Labs   CBC: Recent Labs  Lab 08/13/19 1710 08/15/19 1053 08/16/19 0126 08/16/19 0320  WBC 10.7* 12.0* 8.5  --   NEUTROABS  --  9.8*  --   --   HGB 10.6* 11.2* 11.1* 9.2*  HCT 34.5* 36.0 35.6* 27.0*  MCV 90.1 87.0 86.4  --   PLT 225 175 149*  --     Basic Metabolic Panel: Recent Labs  Lab 08/13/19 1710 08/15/19 1053 08/15/19 1227 08/16/19 0126 08/16/19 0320  NA 134* 137  --   --  142  K 3.8 4.5  --   --  3.8  CL 101 100  --   --   --   CO2 25 25  --   --   --   GLUCOSE 157* 122*  --   --   --   BUN 14 58*  --   --   --   CREATININE 0.80 3.43*  --   --   --   CALCIUM 8.3* 7.8*  --   --   --   MG  --   --  2.6* 2.6*  --   PHOS  --   --   --  4.9*  --    GFR: Estimated Creatinine Clearance: 17.2 mL/min (A) (by C-G formula based on SCr of 3.43 mg/dL (H)). Recent Labs  Lab 08/13/19 1710 08/15/19 1053 08/15/19 2336 08/16/19 0126 08/16/19 0208  PROCALCITON  --   --  7.01  --   --   WBC 10.7* 12.0*  --  8.5  --   LATICACIDVEN  --  2.0*  --   --  1.6    Liver Function Tests: Recent Labs  Lab 08/15/19 1053  AST 3,821*  ALT 2,490*  ALKPHOS 162*  BILITOT 1.6*  PROT 6.2*  ALBUMIN 3.5   No results for input(s):  LIPASE, AMYLASE in the last 168 hours. Recent Labs  Lab 08/15/19 1227  AMMONIA 45*    ABG    Component Value Date/Time   PHART 7.328 (L) 08/16/2019 0320   PCO2ART 42.1 08/16/2019 0320   PO2ART 83.0 08/16/2019 0320   HCO3 22.1 08/16/2019 0320  TCO2 23 08/16/2019 0320   ACIDBASEDEF 4.0 (H) 08/16/2019 0320   O2SAT 95.0 08/16/2019 0320     Coagulation Profile: Recent Labs  Lab 08/15/19 1227  INR 1.6*    Cardiac Enzymes: Recent Labs  Lab 08/15/19 1413 08/16/19 0126  CKTOTAL 6,072* 3,036*  CKMB  --  31.7*    HbA1C: Hgb A1c MFr Bld  Date/Time Value Ref Range Status  08/16/2019 01:26 AM 5.8 (H) 4.8 - 5.6 % Final    Comment:    (NOTE) Pre diabetes:          5.7%-6.4% Diabetes:              >6.4% Glycemic control for   <7.0% adults with diabetes     CBG: Recent Labs  Lab 08/15/19 2111 08/16/19 0113 08/16/19 0328 08/16/19 0757  GLUCAP 108* 95 85 96    Critical care time: 33 minutes       , MSN, NP-C [facility] 08/16/2019, 10:28 AM   Please see [email or web address] for pager details.      

## 2019-08-16 NOTE — Progress Notes (Signed)
Initial Nutrition Assessment  RD working remotely.  DOCUMENTATION CODES:   Not applicable  INTERVENTION:   Initiate TF via OGT: Vital AF 1.2 at 50 ml/h (1200 ml per day)   Provides 1440 kcals, 90 gm protein, 973 ml free water daily.  TF + propofol provides 1742 kcals  NUTRITION DIAGNOSIS:   Inadequate oral intake related to inability to eat as evidenced by NPO status.  GOAL:   Patient will meet greater than or equal to 90% of their needs  MONITOR:   PO intake, Vent status, Labs, Weight trends, TF tolerance, Skin, I & O's  REASON FOR ASSESSMENT:   Consult, Ventilator Enteral/tube feeding initiation and management  ASSESSMENT:   49 year old woman with a history of HIV, COPD, seizure d/o, polysubstance abuse, with new AMS.  Daughter called EMS for suspected overdose, found pill bottles half empty after new prescription filled 08/09/19. To [facility] ED. Benzos and opiates pos on urine tox at 1230.  Given Geodon in ER, developed progressive somnolence > intubated.  Tx to [facility].  Pt admitted with acute metabolic encephalopathy, suspicious for OD.   3/14- intubated  Patient is currently intubated on ventilator support. OGT placement verified by x-ray.  MV: 6.1 L/min Temp (24hrs), Avg:98.3 F (36.8 C), Min:95.9 F (35.5 C), Max:100.2 F (37.9 C)  Propofol: 11.43 ml/hr (provides 302 kcals)  MAP: 97  Reviewed I/O's: +3.9 L x 24 hours  UOP: 565 ml x 24 hours   Per PCCM notes, possible transfer to [facility] ICU for possible liver failure, currently on pending transfer list as no beds available at this time.   Medications reviewed and include IV solu-medrol.   Lab Results  Component Value Date   HGBA1C 5.8 (H) 08/16/2019   PTA DM medications are none.   Labs reviewed: CBGS: 96 (inpatient orders for glycemic control are 0-6 units insulin aspart every 4 hours).   Diet Order:   Diet Order            Diet NPO time specified  Diet effective now               EDUCATION NEEDS:   No education needs have been identified at this time  Skin:  Skin Assessment: Reviewed RN Assessment  Last BM:  08/16/19  Height:   Ht Readings from Last 1 Encounters:  08/15/19 5' (1.524 m)    Weight:   Wt Readings from Last 1 Encounters:  08/16/19 66 kg    Ideal Body Weight:  45.5 kg  BMI:  Body mass index is 28.42 kg/m.  Estimated Nutritional Needs:   Kcal:  1477  Protein:  90-105 grams  Fluid:  > 1.5 L    Loistine Chance, RD, LDN, [place] Registered Dietitian II Certified Diabetes Care and Education Specialist Please refer to [facility] for RD and/or RD on-call/weekend/after hours pager

## 2019-08-16 NOTE — Progress Notes (Signed)
  Echocardiogram 2D Echocardiogram has been performed.  Elizabeth [name] 08/16/2019, 11:28 AM

## 2019-08-16 NOTE — Progress Notes (Signed)
MEDICATION RELATED CONSULT NOTE - FOLLOW UP   Pharmacy Consult for Acetylcysteine IV Indication: Tylenol overdose  Allergies  Allergen Reactions  . Penicillins Itching and Other (See Comments)  . Ibuprofen Other (See Comments)  . Morphine   . Cariprazine Anxiety    Pt states she has hallucinations and suicidal thoughts when taking this medicine  Pt states she has hallucinations and suicidal thoughts when taking this medicine    . Tramadol Rash and Other (See Comments)    headaches     Patient Measurements: Height: 5' (152.4 cm) Weight: 145 lb 8.1 oz (66 kg) IBW/kg (Calculated) : 45.5   Vital Signs: Temp: 99.5 F (37.5 C) (03/15 1200) Temp Source: Bladder (03/15 1100) BP: 144/98 (03/15 1200) Pulse Rate: 110 (03/15 1200) Intake/Output from previous day: 03/14 0701 - 03/15 0700 In: 4438.3 [I.V.:3130.3; IV Piggyback:1308] Out: 565 [Urine:565] Intake/Output from this shift: Total I/O In: 1285.2 [I.V.:552.3; Other:425; IV Piggyback:307.9] Out: 190 [Urine:190]  Labs: Recent Labs    08/13/19 1710 08/13/19 1710 08/15/19 1053 08/15/19 1227 08/16/19 0104 08/16/19 0126 08/16/19 0320 08/16/19 1107  WBC 10.7*  --  12.0*  --   --  8.5  --   --   HGB 10.6*   < > 11.2*  --   --  11.1* 9.2*  --   HCT 34.5*   < > 36.0  --   --  35.6* 27.0*  --   PLT 225  --  175  --   --  149*  --   --   CREATININE 0.80  --  3.43*  --   --   --   --  3.36*  LABCREA  --   --   --   --  60.19  --   --   --   MG  --   --   --  2.6*  --  2.6*  --  2.3  PHOS  --   --   --   --   --  4.9*  --  4.2  ALBUMIN  --   --  3.5  --   --   --   --  2.3*  PROT  --   --  6.2*  --   --   --   --  4.6*  AST  --   --  3,821*  --   --   --   --  1,660*  ALT  --   --  2,490*  --   --   --   --  1,807*  ALKPHOS  --   --  162*  --   --   --   --  124  BILITOT  --   --  1.6*  --   --   --   --  1.4*   < > = values in this interval not displayed.   Estimated Creatinine Clearance: 17.5 mL/min (A) (by C-G  formula based on SCr of 3.36 mg/dL (H)).   Microbiology: Recent Results (from the past 720 hour(s))  Respiratory Panel by RT PCR (Flu A&B, Covid) - Nasopharyngeal Swab     Status: None   Collection Time: 08/15/19 12:27 PM   Specimen: Nasopharyngeal Swab  Result Value Ref Range Status   SARS Coronavirus 2 by RT PCR NEGATIVE NEGATIVE Final    Comment: (NOTE) SARS-CoV-2 target nucleic acids are NOT DETECTED. The SARS-CoV-2 RNA is generally detectable in upper respiratoy specimens during the acute phase of infection. The lowest  concentration of SARS-CoV-2 viral copies this assay can detect is 131 copies/mL. A negative result does not preclude SARS-Cov-2 infection and should not be used as the sole basis for treatment or other patient management decisions. A negative result may occur with  improper specimen collection/handling, submission of specimen other than nasopharyngeal swab, presence of viral mutation(s) within the areas targeted by this assay, and inadequate number of viral copies (<131 copies/mL). A negative result must be combined with clinical observations, patient history, and epidemiological information. The expected result is Negative. Fact Sheet for Patients:  PinkCheek.be Fact Sheet for Healthcare Providers:  GravelBags.it This test is not yet ap proved or cleared by the [place] FDA and  has been authorized for detection and/or diagnosis of SARS-CoV-2 by FDA under an Emergency Use Authorization (EUA). This EUA will remain  in effect (meaning this test can be used) for the duration of the COVID-19 declaration under Section 564(b)(1) of the Act, 21 U.S.C. section 360bbb-3(b)(1), unless the authorization is terminated or revoked sooner.    Influenza A by PCR NEGATIVE NEGATIVE Final   Influenza B by PCR NEGATIVE NEGATIVE Final    Comment: (NOTE) The Xpert Xpress SARS-CoV-2/FLU/RSV assay is intended as an aid  in  the diagnosis of influenza from Nasopharyngeal swab specimens and  should not be used as a sole basis for treatment. Nasal washings and  aspirates are unacceptable for Xpert Xpress SARS-CoV-2/FLU/RSV  testing. Fact Sheet for Patients: PinkCheek.be Fact Sheet for Healthcare Providers: GravelBags.it This test is not yet approved or cleared by the [place] FDA and  has been authorized for detection and/or diagnosis of SARS-CoV-2 by  FDA under an Emergency Use Authorization (EUA). This EUA will remain  in effect (meaning this test can be used) for the duration of the  Covid-19 declaration under Section 564(b)(1) of the Act, 21  U.S.C. section 360bbb-3(b)(1), unless the authorization is  terminated or revoked. Performed at [facility], [address], [place], [place] [address]   MRSA PCR Screening     Status: None   Collection Time: 08/15/19  9:15 PM   Specimen: Nasopharyngeal  Result Value Ref Range Status   MRSA by PCR NEGATIVE NEGATIVE Final    Comment:        The GeneXpert MRSA Assay (FDA approved for NASAL specimens only), is one component of a comprehensive MRSA colonization surveillance program. It is not intended to diagnose MRSA infection nor to guide or monitor treatment for MRSA infections. Performed at [facility], [place] [address], [place], [place] [address]   Culture, blood (routine x 2)     Status: None (Preliminary result)   Collection Time: 08/16/19  1:26 AM   Specimen: BLOOD LEFT HAND  Result Value Ref Range Status   Specimen Description BLOOD LEFT HAND  Final   Special Requests   Final    BOTTLES DRAWN AEROBIC ONLY Blood Culture results may not be optimal due to an inadequate volume of blood received in culture bottles   Culture NO GROWTH < 12 HOURS  Final   Report Status PENDING  Incomplete  Culture, blood (routine x 2)     Status: None (Preliminary result)   Collection Time: 08/16/19   1:26 AM   Specimen: BLOOD  Result Value Ref Range Status   Specimen Description BLOOD LEFT THUMB  Final   Special Requests   Final    BOTTLES DRAWN AEROBIC ONLY Blood Culture results may not be optimal due to an inadequate volume of  blood received in culture bottles   Culture NO GROWTH < 12 HOURS  Final   Report Status PENDING  Incomplete    Medications:  Medications Prior to Admission  Medication Sig Dispense Refill Last Dose  . albuterol (PROVENTIL HFA;VENTOLIN HFA) 108 (90 BASE) MCG/ACT inhaler Inhale 2 puffs into the lungs every 6 (six) hours as needed for wheezing or shortness of breath.   unknown  . albuterol (PROVENTIL) (2.5 MG/3ML) 0.083% nebulizer solution Take 6 mLs (5 mg total) by nebulization every 6 (six) hours as needed for wheezing or shortness of breath. 75 mL 2   . atorvastatin (LIPITOR) 20 MG tablet Take 20 mg by mouth daily.   3   . divalproex (DEPAKOTE) 250 MG DR tablet Take 250-500 mg by mouth 2 (two) times daily. 250 mg in the am and 500 in the pm     . gabapentin (NEURONTIN) 300 MG capsule Take 300 mg by mouth 3 (three) times daily.      . GENVOYA 150-150-200-10 MG TABS tablet Take 1 tablet by mouth every morning.   3 unknown  . HYDROcodone-acetaminophen (NORCO/VICODIN) 5-325 MG tablet Take 2 tablets by mouth every 4 (four) hours as needed. 10 tablet 0   . hydrOXYzine (ATARAX/VISTARIL) 25 MG tablet Take 1 tablet by mouth 3 (three) times daily as needed.     Marland Kitchen ipratropium-albuterol (DUONEB) 0.5-2.5 (3) MG/3ML SOLN Inhale 3 mLs into the lungs every 6 (six) hours as needed (for shortness of breath-wheezing).   3   . levETIRAcetam (KEPPRA) 750 MG tablet Take 750 mg by mouth 2 (two) times daily.   3   . omeprazole (PRILOSEC) 20 MG capsule Take 1 capsule by mouth daily.     Marland Kitchen oxyCODONE ER (XTAMPZA ER) 13.5 MG C12A Take 13.5 mg by mouth daily as needed (pain).     Marland Kitchen oxyCODONE-acetaminophen (PERCOCET/ROXICET) 5-325 MG tablet Take 1 tablet by mouth 4 (four) times daily as  needed for moderate pain.     . pantoprazole (PROTONIX) 40 MG tablet Take 40 mg by mouth 2 (two) times daily.     . pregabalin (LYRICA) 75 MG capsule Take 1 capsule by mouth 3 (three) times daily.     Marland Kitchen rOPINIRole (REQUIP) 1 MG tablet Take 1 mg by mouth at bedtime.   3   . SPIRIVA HANDIHALER 18 MCG inhalation capsule Place 18 mcg into inhaler and inhale daily.   3 unknown  . sucralfate (CARAFATE) 1 g tablet Take 1 g by mouth 4 (four) times daily.      . SYMBICORT 160-4.5 MCG/ACT inhaler Inhale 2 puffs into the lungs 2 (two) times daily.   3 unknown  . venlafaxine (EFFEXOR) 37.5 MG tablet Take 1 tablet by mouth 2 (two) times a day.     . Vitamin D, Ergocalciferol, (DRISDOL) 1.25 MG (50000 UT) CAPS capsule Take 1 capsule by mouth once a week.       Assessment: Patient presents with possible overdose of oxycodone and gabapentin. Of note, patient also had a prescription for [place] consulted to assist with acetylcysteine dosing. Patient was started on acetylcysteine 66m/kg/hr [facility]. INR remains at 1.6 and AST decreased from 3821 to 1660 and ALT decreased from 2490 to 1807. APAP level <10.   Plan:  Discontinue acetylcysteine per recommendation of [place] Poison Control based on INR and LFTs obtained  GCristela Felt PharmD PGY1 Pharmacy Resident [employer]: [phone] 08/16/2019 1:15 PM

## 2019-08-16 NOTE — Progress Notes (Signed)
Spoke with daughter Caryl Pina.  She had last seen her PM 3/13.  Had been incontinent of stool/diarrhea at the time (unusual for her).  Had another episode of this 3/14 am.   Altered, agitated and combative at the time.   Does not think this was intentional overdose.  She has chronic leg pain that had been worsening, may have been just trying to treat her pain.  No known hx of acetaminophen ingestion.  Wasn't sure about her norco prescription, whether she had been taking it or not.   No hx of suicidality.  No hx of liver problems.

## 2019-08-17 LAB — GLUCOSE, CAPILLARY
Glucose-Capillary: 157 mg/dL — ABNORMAL HIGH (ref 70–99)
Glucose-Capillary: 147 mg/dL — ABNORMAL HIGH (ref 70–99)
Glucose-Capillary: 138 mg/dL — ABNORMAL HIGH (ref 70–99)
Glucose-Capillary: 169 mg/dL — ABNORMAL HIGH (ref 70–99)
Glucose-Capillary: 176 mg/dL — ABNORMAL HIGH (ref 70–99)

## 2019-08-17 LAB — COMPREHENSIVE METABOLIC PANEL
ALT: 1327 U/L — ABNORMAL HIGH (ref 0–44)
AST: 650 U/L — ABNORMAL HIGH (ref 15–41)
Albumin: 2.3 g/dL — ABNORMAL LOW (ref 3.5–5.0)
Alkaline Phosphatase: 125 U/L (ref 38–126)
Anion gap: 12 (ref 5–15)
BUN: 47 mg/dL — ABNORMAL HIGH (ref 6–20)
CO2: 20 mmol/L — ABNORMAL LOW (ref 22–32)
Calcium: 8.2 mg/dL — ABNORMAL LOW (ref 8.9–10.3)
Chloride: 115 mmol/L — ABNORMAL HIGH (ref 98–111)
Creatinine, Ser: 2.78 mg/dL — ABNORMAL HIGH (ref 0.44–1.00)
GFR calc Af Amer: 23 mL/min — ABNORMAL LOW (ref >60)
GFR calc non Af Amer: 19 mL/min — ABNORMAL LOW (ref >60)
Glucose, Bld: 145 mg/dL — ABNORMAL HIGH (ref 70–99)
Potassium: 4.3 mmol/L (ref 3.5–5.1)
Sodium: 147 mmol/L — ABNORMAL HIGH (ref 135–145)
Total Bilirubin: 0.9 mg/dL (ref 0.3–1.2)
Total Protein: 4.5 g/dL — ABNORMAL LOW (ref 6.5–8.1)

## 2019-08-17 LAB — CBC
HCT: 31.9 % — ABNORMAL LOW (ref 36.0–46.0)
Hemoglobin: 9.9 g/dL — ABNORMAL LOW (ref 12.0–15.0)
MCH: 27.1 pg (ref 26.0–34.0)
MCHC: 31 g/dL (ref 30.0–36.0)
MCV: 87.4 fL (ref 80.0–100.0)
Platelets: 218 10*3/uL (ref 150–400)
RBC: 3.65 MIL/uL — ABNORMAL LOW (ref 3.87–5.11)
RDW: 18.3 % — ABNORMAL HIGH (ref 11.5–15.5)
WBC: 4.9 10*3/uL (ref 4.0–10.5)
nRBC: 0 % (ref 0.0–0.2)

## 2019-08-17 LAB — CK
Total CK: 355 U/L — ABNORMAL HIGH (ref 38–234)
Total CK: 362 U/L — ABNORMAL HIGH (ref 38–234)

## 2019-08-17 LAB — TRIGLYCERIDES: Triglycerides: 202 mg/dL — ABNORMAL HIGH (ref <150)

## 2019-08-17 LAB — PHOSPHORUS
Phosphorus: 4.2 mg/dL (ref 2.5–4.6)
Phosphorus: 4 mg/dL (ref 2.5–4.6)

## 2019-08-17 LAB — MAGNESIUM
Magnesium: 2 mg/dL (ref 1.7–2.4)
Magnesium: 2.1 mg/dL (ref 1.7–2.4)

## 2019-08-17 LAB — PROTIME-INR
INR: 1.5 — ABNORMAL HIGH (ref 0.8–1.2)
Prothrombin Time: 18.2 seconds — ABNORMAL HIGH (ref 11.4–15.2)

## 2019-08-17 NOTE — Progress Notes (Signed)
Two rings removed from right hand due to edema in hand. One - wilver w/ blue stone, two, silver with multiple bands. Rings given to Gerilyn Pilgrim, patient's daughter. Witnessed by Patrick Jupiter, NT.

## 2019-08-17 NOTE — Progress Notes (Signed)
NAME:  Elizabeth [name], MRN:  [id number], DOB:  December 20, 1970, LOS: 2 ADMISSION DATE:  08/15/2019, CONSULTATION DATE:  08/15/19 REFERRING MD: [facility], CHIEF COMPLAINT:  Altered mental status  Brief History   49 year old woman with a history of HIV, COPD, seizure d/o, polysubstance abuse, with new AMS.  Daughter called EMS for suspected overdose, found pill bottles half empty after new prescription filled 08/09/19. To [facility] ED. Benzos and opiates pos on urine tox at 1230.  Given Geodon in ER, developed progressive somnolence > intubated.  Tx to [facility].   Past Medical History  HIV (stribild) COPD Depression (trazodone, seroquel) Migraines Seizures (depakote, last seizure 01/02/19) Hx lumpectomy?  Hx of VATS for wedge resection 2015  [place] [facility] Events   3/14 Admit with suspected overose   Consults:  Poison Control   Procedures:  ETT 3/14 >>   Significant Diagnostic Tests:   CT Head 3/14 >> Normal CT appearance of the brain. Extensive soft tissue prominence within the nasal cavity may represent polyps or diffuse nasal mucosal swelling. Chronic right maxillary sinus disease.  ECHO 3/15 >> LVEF 55-60%, normal LV function, no RWMA, RV systolic function normal, moderately elevated PA systolic pressure, trivial MVR  Micro Data:  COVID 3/14 >> negative  Influenza A/B 3/14 >> negative  MRSA PCR 3/14 >> negative  Tracheal Aspirate 3/15 >>  PCP Smear 3/15 >>  BCx2 3/15 >>   Antimicrobials:  Cefepime 3/15 >> 3/15 Flagyl 3/15 >> 3/15  Ceftriaxone 3/15 >>  Interim history/subjective:  Remains on vent - weaning on PSV Glucose range 133-147 Afebrile I/O 1.7L UOP, +5.7L in 24 hours  RN reports no acute events, weaning propofol down  Objective   Blood pressure 113/60, pulse 98, temperature 98.6 F (37 C), resp. rate 14, height 5' (1.524 m), weight 70 kg, SpO2 96 %.    Vent Mode: AC FiO2 (%):  [40 %] 40 % Set Rate:  [18 bmp] 18 bmp Vt Set:  [400 mL] 400 mL PEEP:  [5 cmH20] 5  cmH20 Pressure Support:  [5 cmH20-8 cmH20] 5 cmH20 Plateau Pressure:  [13 cmH20-15 cmH20] 14 cmH20   Intake/Output Summary (Last 24 hours) at 08/17/2019 0841 Last data filed at 08/17/2019 0700 Gross per 24 hour  Intake 7058.71 ml  Output 1650 ml  Net 5408.71 ml   Filed Weights   08/15/19 2200 08/16/19 0500 08/17/19 0500  Weight: 66 kg 66 kg 70 kg    Examination: General: small adult female lying in bed in NAD on vent   HEENT: MM pink/moist, ETT, pupils 67m reactive  Neuro: sedate, no response to verbal stimuli  CV: s1s2 RRR, no m/r/g PULM:  Non-labored on vent, lungs bilaterally with wheezing  GI: soft, bsx4 active  Extremities: warm/dry, no edema  Skin: no rashes or lesions  Resolved Hospital Problem list     Assessment & Plan:   Acute Metabolic Encephalopathy  Suspected Overdose  Suspect in setting of overdose.  No hx of suicidality.  Oxycodone, gabapentin bottles empty after recent refill.  Elevated LFT's (on norco).  Ammonia 45, VPA, salicylate, acetaminophen negative -supportive care -Appreciate Poison Control  -follow up am labs  -minimize sedation medications  -early PT / OT efforts  -PAD protocol for RASS goal of 0 to -1  -begin [facility]   Transaminitis  Acute Liver Failure  Possibly medication related?  -No need for [place] to [place]follow LFT's, INR  -  await AM labs -[facility] ICU aware of possible transfer for liver failure     Rhabdomyolysis AKI  -trend CK -NS at 225m /hr   Hx Seizures  -continue keppra BID  -doubt she was taking depakote given undetectable levels  -seizure precautions   Acute Hypoxemic Respiratory Failure  Suspected Aspiration Pneumonitis Bronchospasm   Hypoxia / Large A-a Gradient. PaO2 69.8 at 430pm (fio2 50%).  Improved O2 needs.  -PRVC 8cc/kg as rest mode -PSV wean with goal for extubation  -continue rocephin  -solumedrol as ordered, stop date in  place -continue duoneb Q6 + BID pulmicort   Mild Leukocytosis   Mild hypothermia (97.5).   TSH recently normal.  -follow cultures, PCT   NSTEMI  Trop 2703 --> 2241.  EKG without ST changes  -tele monitoring   At Risk Malnutrition  -TF per protocol   Best practice:  Diet: TF  Pain/Anxiety/Delirium protocol (if indicated): PAD protocol  VAP protocol (if indicated): yes DVT prophylaxis: SCD's  GI prophylaxis: PPI  Glucose control: SSI Mobility: BR Code Status: Full Code Family Communication: Will update daughter on arrival.  Disposition: ICU  Labs   CBC: Recent Labs  Lab 08/13/19 1710 08/15/19 1053 08/16/19 0126 08/16/19 0320  WBC 10.7* 12.0* 8.5  --   NEUTROABS  --  9.8*  --   --   HGB 10.6* 11.2* 11.1* 9.2*  HCT 34.5* 36.0 35.6* 27.0*  MCV 90.1 87.0 86.4  --   PLT 225 175 149*  --     Basic Metabolic Panel: Recent Labs  Lab 08/13/19 1710 08/15/19 1053 08/15/19 1227 08/16/19 0126 08/16/19 0320 08/16/19 1107 08/16/19 1714 08/17/19 0307  NA 134* 137  --   --  142 145  --   --   K 3.8 4.5  --   --  3.8 4.3  --   --   CL 101 100  --   --   --  110  --   --   CO2 25 25  --   --   --  18*  --   --   GLUCOSE 157* 122*  --   --   --  96  --   --   BUN 14 58*  --   --   --  54*  --   --   CREATININE 0.80 3.43*  --   --   --  3.36*  --   --   CALCIUM 8.3* 7.8*  --   --   --  8.0*  --   --   MG  --   --  2.6* 2.6*  --  2.3 2.3 2.0  PHOS  --   --   --  4.9*  --  4.2 4.1 4.2   GFR: Estimated Creatinine Clearance: 18.1 mL/min (A) (by C-G formula based on SCr of 3.36 mg/dL (H)). Recent Labs  Lab 08/13/19 1710 08/15/19 1053 08/15/19 2336 08/16/19 0126 08/16/19 0208  PROCALCITON  --   --  7.01  --   --   WBC 10.7* 12.0*  --  8.5  --   LATICACIDVEN  --  2.0*  --   --  1.6    Liver Function Tests: Recent Labs  Lab 08/15/19 1053 08/16/19 1107  AST 3,821* 1,660*  ALT 2,490* 1,807*  ALKPHOS 162* 124  BILITOT 1.6* 1.4*  PROT 6.2* 4.6*  ALBUMIN 3.5 2.3*    No results for input(s): LIPASE, AMYLASE in the last 168 hours. Recent Labs  Lab 08/15/19 1227  AMMONIA 45*    ABG    Component Value Date/Time   PHART 7.328 (L) 08/16/2019 0320   PCO2ART 42.1 08/16/2019 0320   PO2ART 83.0 08/16/2019 0320   HCO3 22.1 08/16/2019 0320   TCO2 23 08/16/2019 0320   ACIDBASEDEF 4.0 (H) 08/16/2019 0320   O2SAT 95.0 08/16/2019 0320     Coagulation Profile: Recent Labs  Lab 08/15/19 1227 08/16/19 1107  INR 1.6* 1.6*    Cardiac Enzymes: Recent Labs  Lab 08/15/19 1413 08/16/19 0126  CKTOTAL 6,072* 3,036*  CKMB  --  31.7*    HbA1C: Hgb A1c MFr Bld  Date/Time Value Ref Range Status  08/16/2019 01:26 AM 5.8 (H) 4.8 - 5.6 % Final    Comment:    (NOTE) Pre diabetes:          5.7%-6.4% Diabetes:              >6.4% Glycemic control for   <7.0% adults with diabetes     CBG: Recent Labs  Lab 08/16/19 1541 08/16/19 1958 08/16/19 2348 08/17/19 0348 08/17/19 0817  GLUCAP 110* 105* 133* 157* 147*    Critical care time: 33 minutes      Noe Gens, MSN, NP-C [facility] 08/17/2019, 8:41 AM   Please see [email or web address] for pager details.

## 2019-08-17 NOTE — Plan of Care (Signed)
  Problem: Clinical Measurements: Goal: Diagnostic test results will improve Outcome: Progressing Goal: Respiratory complications will improve Outcome: Progressing Goal: Cardiovascular complication will be avoided Outcome: Progressing   Problem: Pain Managment: Goal: General experience of comfort will improve Outcome: Progressing

## 2019-08-18 ENCOUNTER — Inpatient Hospital Stay (HOSPITAL_COMMUNITY): Payer: Medicaid Other

## 2019-08-18 LAB — COMPREHENSIVE METABOLIC PANEL
ALT: 1026 U/L — ABNORMAL HIGH (ref 0–44)
AST: 220 U/L — ABNORMAL HIGH (ref 15–41)
Albumin: 2.4 g/dL — ABNORMAL LOW (ref 3.5–5.0)
Alkaline Phosphatase: 119 U/L (ref 38–126)
Anion gap: 12 (ref 5–15)
BUN: 48 mg/dL — ABNORMAL HIGH (ref 6–20)
CO2: 17 mmol/L — ABNORMAL LOW (ref 22–32)
Calcium: 8.5 mg/dL — ABNORMAL LOW (ref 8.9–10.3)
Chloride: 122 mmol/L — ABNORMAL HIGH (ref 98–111)
Creatinine, Ser: 2.07 mg/dL — ABNORMAL HIGH (ref 0.44–1.00)
GFR calc Af Amer: 32 mL/min — ABNORMAL LOW (ref >60)
GFR calc non Af Amer: 28 mL/min — ABNORMAL LOW (ref >60)
Glucose, Bld: 170 mg/dL — ABNORMAL HIGH (ref 70–99)
Potassium: 4.3 mmol/L (ref 3.5–5.1)
Sodium: 151 mmol/L — ABNORMAL HIGH (ref 135–145)
Total Bilirubin: 1 mg/dL (ref 0.3–1.2)
Total Protein: 4.8 g/dL — ABNORMAL LOW (ref 6.5–8.1)

## 2019-08-18 LAB — CBC
HCT: 30.9 % — ABNORMAL LOW (ref 36.0–46.0)
Hemoglobin: 9.6 g/dL — ABNORMAL LOW (ref 12.0–15.0)
MCH: 27 pg (ref 26.0–34.0)
MCHC: 31.1 g/dL (ref 30.0–36.0)
MCV: 87 fL (ref 80.0–100.0)
Platelets: 262 10*3/uL (ref 150–400)
RBC: 3.55 MIL/uL — ABNORMAL LOW (ref 3.87–5.11)
RDW: 18.7 % — ABNORMAL HIGH (ref 11.5–15.5)
WBC: 5.9 10*3/uL (ref 4.0–10.5)
nRBC: 0 % (ref 0.0–0.2)

## 2019-08-18 LAB — CULTURE, RESPIRATORY W GRAM STAIN: Culture: NORMAL

## 2019-08-18 LAB — CK
Total CK: 274 U/L — ABNORMAL HIGH (ref 38–234)
Total CK: 265 U/L — ABNORMAL HIGH (ref 38–234)

## 2019-08-18 LAB — GLUCOSE, CAPILLARY
Glucose-Capillary: 109 mg/dL — ABNORMAL HIGH (ref 70–99)
Glucose-Capillary: 123 mg/dL — ABNORMAL HIGH (ref 70–99)
Glucose-Capillary: 145 mg/dL — ABNORMAL HIGH (ref 70–99)
Glucose-Capillary: 149 mg/dL — ABNORMAL HIGH (ref 70–99)
Glucose-Capillary: 155 mg/dL — ABNORMAL HIGH (ref 70–99)
Glucose-Capillary: 174 mg/dL — ABNORMAL HIGH (ref 70–99)
Glucose-Capillary: 194 mg/dL — ABNORMAL HIGH (ref 70–99)

## 2019-08-18 MED ORDER — ORAL CARE MOUTH RINSE: 15.0000 mL | Freq: Two times a day (BID) | OROMUCOSAL | Status: DC

## 2019-08-18 MED ORDER — SODIUM CHLORIDE 0.9 % IV SOLN
750.0000 mg | Freq: Two times a day (BID) | INTRAVENOUS | Status: DC
  Administered 2019-08-18 – 2019-08-20 (×5): 750 mg via INTRAVENOUS
  Filled 2019-08-18 (×6): qty 7.5

## 2019-08-18 MED ORDER — DEXMEDETOMIDINE HCL IN NACL 400 MCG/100ML IV SOLN
0.4000 ug/kg/h | INTRAVENOUS | Status: DC
  Administered 2019-08-18: 0.4 ug/kg/h via INTRAVENOUS
  Administered 2019-08-18: 1.4 ug/kg/h via INTRAVENOUS
  Administered 2019-08-19: 1.2 ug/kg/h via INTRAVENOUS
  Administered 2019-08-19: 1.4 ug/kg/h via INTRAVENOUS
  Administered 2019-08-19: 1.2 ug/kg/h via INTRAVENOUS
  Administered 2019-08-19: 1.4 ug/kg/h via INTRAVENOUS
  Administered 2019-08-20 (×2): 0.8 ug/kg/h via INTRAVENOUS
  Administered 2019-08-20 – 2019-08-21 (×6): 1.4 ug/kg/h via INTRAVENOUS
  Administered 2019-08-22: 1.2 ug/kg/h via INTRAVENOUS
  Administered 2019-08-22: 1.4 ug/kg/h via INTRAVENOUS
  Filled 2019-08-18 (×13): qty 100
  Filled 2019-08-18: qty 200
  Filled 2019-08-18 (×5): qty 100

## 2019-08-18 MED ORDER — LORAZEPAM 2 MG/ML IJ SOLN
2.0000 mg | INTRAMUSCULAR | Status: DC | PRN
  Administered 2019-08-18 – 2019-08-20 (×7): 2 mg via INTRAVENOUS
  Filled 2019-08-18 (×7): qty 1

## 2019-08-18 MED ORDER — CHLORHEXIDINE GLUCONATE 0.12 % MT SOLN
15.0000 mL | Freq: Two times a day (BID) | OROMUCOSAL | Status: DC
  Administered 2019-08-18 – 2019-08-25 (×13): 15 mL via OROMUCOSAL
  Filled 2019-08-18 (×10): qty 15

## 2019-08-18 MED ORDER — HYDRALAZINE HCL 20 MG/ML IJ SOLN
5.0000 mg | INTRAMUSCULAR | Status: DC | PRN
  Administered 2019-08-18: 21:00:00 10 mg via INTRAVENOUS
  Filled 2019-08-18: qty 1

## 2019-08-18 MED ORDER — LORAZEPAM 2 MG/ML IJ SOLN
4.0000 mg | Freq: Once | INTRAMUSCULAR | Status: DC
  Filled 2019-08-18: qty 2

## 2019-08-18 NOTE — Progress Notes (Signed)
Pt was taken off of BIPAP and placed on 3 L [place]. Pt is relaxed at this time. RT will monitor.

## 2019-08-18 NOTE — Progress Notes (Signed)
eLink Physician-Brief Progress Note Patient Name: Elizabeth [name] DOB: 07-30-1970 MRN: [id number]   Date of Service  08/18/2019  HPI/Events of Note  Hypertension  eICU Interventions  Hydralazine 5-10 mg iv Q 4 hours PRN SBP > 160 mmHg.        Frederik Pear 08/18/2019, 8:43 PM

## 2019-08-18 NOTE — Progress Notes (Signed)
Pt had increased SOB post extubation with increased expiratory wheezing. Pt was not able to communicate fully with RN and RT due to high RR. Placed pt on BIPAP per NP to help decrease SOB. Pt is currently tolerating at this time and seems to have relaxed on it. RT will continue to monitor.

## 2019-08-18 NOTE — Progress Notes (Signed)
Notified provider pt very restless and agitated, moaning, BP 174/104, PRN fentanyl given with minimal relief. Provider ordered precedex be started.

## 2019-08-18 NOTE — Progress Notes (Signed)
NAME:  Elizabeth [name], MRN:  [id number], DOB:  1970/06/08, LOS: 3 ADMISSION DATE:  08/15/2019, CONSULTATION DATE:  08/15/19 REFERRING MD: [facility], CHIEF COMPLAINT:  Altered mental status  Brief History   49 year old woman with a history of HIV, COPD, seizure d/o, polysubstance abuse, with new AMS.  Daughter called EMS for suspected overdose, found pill bottles half empty after new prescription filled 08/09/19. To [facility] ED. Benzos and opiates pos on urine tox at 1230.  Given Geodon in ER, developed progressive somnolence > intubated.  Tx to [facility].   Past Medical History  HIV (stribild) COPD Depression (trazodone, seroquel) Migraines Seizures (depakote, last seizure 01/02/19) Hx lumpectomy?  Hx of VATS for wedge resection 2015  [facility] Events   3/14 Admit with suspected overose   Consults:  Poison Control   Procedures:  ETT 3/14 >>   Significant Diagnostic Tests:   CT Head 3/14 >> Normal CT appearance of the brain. Extensive soft tissue prominence within the nasal cavity may represent polyps or diffuse nasal mucosal swelling. Chronic right maxillary sinus disease.  ECHO 3/15 >> LVEF 55-60%, normal LV function, no RWMA, RV systolic function normal, moderately elevated PA systolic pressure, trivial MVR  Micro Data:  COVID 3/14 >> negative  Influenza A/B 3/14 >> negative  MRSA PCR 3/14 >> negative  Tracheal Aspirate 3/15 >>  PCP Smear 3/15 >>  BCx2 3/15 >>   Antimicrobials:  Cefepime 3/15 >> 3/15 Flagyl 3/15 >> 3/15  Ceftriaxone 3/15 >>  Interim history/subjective:  Remains mechanically ventilated. On no vasopressors.   Objective   Blood pressure (!) 144/91, pulse (!) 102, temperature 99.7 F (37.6 C), temperature source Bladder, resp. rate (!) 25, height 5' (1.524 m), weight 73.8 kg, SpO2 100 %.    Vent Mode: PSV;CPAP FiO2 (%):  [40 %] 40 % Set Rate:  [18 bmp] 18 bmp Vt Set:  [400 mL] 400 mL PEEP:  [5 cmH20] 5 cmH20 Pressure Support:  [5 cmH20-10 cmH20] 10  cmH20 Plateau Pressure:  [13 cmH20-16 cmH20] 15 cmH20   Intake/Output Summary (Last 24 hours) at 08/18/2019 0240 Last data filed at 08/18/2019 0800 Gross per 24 hour  Intake 6531.61 ml  Output 2135 ml  Net 4396.61 ml   Filed Weights   08/16/19 0500 08/17/19 0500 08/18/19 0500  Weight: 66 kg 70 kg 73.8 kg    Examination: General: small adult female lying in bed in NAD on vent   HEENT: MM pink/moist, ETT, pupils 47m reactive  Neuro: sedate, no response to verbal stimuli  CV: s1s2 RRR, no m/r/g PULM:  Non-labored on vent, lungs bilaterally with wheezing  GI: soft, bsx4 active  Extremities: warm/dry, no edema  Skin: no rashes or lesions  Resolved Hospital Problem list     Assessment & Plan:   Acute Metabolic Encephalopathy  Suspected Overdose  Suspect in setting of overdose.  No hx of suicidality.  Oxycodone, gabapentin bottles empty after recent refill.  Elevated LFT's (on norco).  Ammonia 45, VPA, salicylate, acetaminophen negative -supportive care -Appreciate Poison Control  -follow up am labs  -minimize sedation medications  -early PT / OT efforts  -PAD protocol for RASS goal of 0 to -1  -begin [facility]   Acute Liver Failure with hepatocellular injury pattern. Possibly medication related or due to shock liver (most likely). Improving. - No need for transfer to quaternary care center.  AKI secondary to  Rhabdomyolysis -  improving.  -Can stop forced diuresis.  Hx Seizures  -continue keppra BID  -doubt she was taking depakote given undetectable levels  -seizure precautions   Critically ill due to Acute Hypoxemic Respiratory Failure requiring mechanical ventilation due to Suspected Aspiration Pneumonitis with associated Bronchospasm   - Passed SBT this morning - will extubate. -continue rocephin  -solumedrol as ordered, stop date in place -continue duoneb Q6 + BID pulmicort   Demand related myocardial  injury.  Trop 2703 --> 2241.  EKG without ST changes  -tele monitoring  - no specific therapy required.  Best practice:  Diet: TF  Pain/Anxiety/Delirium protocol (if indicated): PAD protocol  VAP protocol (if indicated): yes DVT prophylaxis: SCD's  GI prophylaxis: PPI  Glucose control: SSI Mobility: BR Code Status: Full Code Family Communication: Will update daughter on arrival.  Disposition: ICU  Labs   CBC: Recent Labs  Lab 08/13/19 1710 08/13/19 1710 08/15/19 1053 08/16/19 0126 08/16/19 0320 08/17/19 0913 08/18/19 0313  WBC 10.7*  --  12.0* 8.5  --  4.9 5.9  NEUTROABS  --   --  9.8*  --   --   --   --   HGB 10.6*   < > 11.2* 11.1* 9.2* 9.9* 9.6*  HCT 34.5*   < > 36.0 35.6* 27.0* 31.9* 30.9*  MCV 90.1  --  87.0 86.4  --  87.4 87.0  PLT 225  --  175 149*  --  218 262   < > = values in this interval not displayed.    Basic Metabolic Panel: Recent Labs  Lab 08/13/19 1710 08/13/19 1710 08/15/19 1053 08/15/19 1227 08/16/19 0126 08/16/19 0320 08/16/19 1107 08/16/19 1714 08/17/19 0307 08/17/19 0913 08/17/19 1624 08/18/19 0313  NA 134*   < > 137  --   --  142 145  --   --  147*  --  151*  K 3.8   < > 4.5  --   --  3.8 4.3  --   --  4.3  --  4.3  CL 101  --  100  --   --   --  110  --   --  115*  --  122*  CO2 25  --  25  --   --   --  18*  --   --  20*  --  17*  GLUCOSE 157*  --  122*  --   --   --  96  --   --  145*  --  170*  BUN 14  --  58*  --   --   --  54*  --   --  47*  --  48*  CREATININE 0.80  --  3.43*  --   --   --  3.36*  --   --  2.78*  --  2.07*  CALCIUM 8.3*  --  7.8*  --   --   --  8.0*  --   --  8.2*  --  8.5*  MG  --   --   --    < > 2.6*  --  2.3 2.3 2.0  --  2.1  --   PHOS  --   --   --   --  4.9*  --  4.2 4.1 4.2  --  4.0  --    < > = values in this interval not displayed.   GFR: Estimated Creatinine Clearance: 30.1 mL/min (A) (by C-G formula based on  SCr of 2.07 mg/dL (H)). Recent Labs  Lab 08/15/19 1053 08/15/19 2336 08/16/19 0126  08/16/19 0208 08/17/19 0913 08/18/19 0313  PROCALCITON  --  7.01  --   --   --   --   WBC 12.0*  --  8.5  --  4.9 5.9  LATICACIDVEN 2.0*  --   --  1.6  --   --     Liver Function Tests: Recent Labs  Lab 08/15/19 1053 08/16/19 1107 08/17/19 0913 08/18/19 0313  AST 3,821* 1,660* 650* 220*  ALT 2,490* 1,807* 1,327* 1,026*  ALKPHOS 162* 124 125 119  BILITOT 1.6* 1.4* 0.9 1.0  PROT 6.2* 4.6* 4.5* 4.8*  ALBUMIN 3.5 2.3* 2.3* 2.4*   No results for input(s): LIPASE, AMYLASE in the last 168 hours. Recent Labs  Lab 08/15/19 1227  AMMONIA 45*    ABG    Component Value Date/Time   PHART 7.328 (L) 08/16/2019 0320   PCO2ART 42.1 08/16/2019 0320   PO2ART 83.0 08/16/2019 0320   HCO3 22.1 08/16/2019 0320   TCO2 23 08/16/2019 0320   ACIDBASEDEF 4.0 (H) 08/16/2019 0320   O2SAT 95.0 08/16/2019 0320     Coagulation Profile: Recent Labs  Lab 08/15/19 1227 08/16/19 1107 08/17/19 0913  INR 1.6* 1.6* 1.5*    Cardiac Enzymes: Recent Labs  Lab 08/15/19 1413 08/16/19 0126 08/17/19 0913 08/17/19 1624 08/18/19 0313  CKTOTAL 6,072* 3,036* 355* 362* 274*  CKMB  --  31.7*  --   --   --     HbA1C: Hgb A1c MFr Bld  Date/Time Value Ref Range Status  08/16/2019 01:26 AM 5.8 (H) 4.8 - 5.6 % Final    Comment:    (NOTE) Pre diabetes:          5.7%-6.4% Diabetes:              >6.4% Glycemic control for   <7.0% adults with diabetes     CBG: Recent Labs  Lab 08/17/19 1541 08/17/19 1946 08/18/19 0009 08/18/19 0421 08/18/19 0802  GLUCAP 169* 176* 194* 145* 174*    CRITICAL CARE Performed by: Kipp Brood   Total critical care time: 40 minutes  Critical care time was exclusive of separately billable procedures and treating other patients.  Critical care was necessary to treat or prevent imminent or life-threatening deterioration.  Critical care was time spent personally by me on the following activities: development of treatment plan with patient and/or surrogate  as well as nursing, discussions with consultants, evaluation of patient's response to treatment, examination of patient, obtaining history from patient or surrogate, ordering and performing treatments and interventions, ordering and review of laboratory studies, ordering and review of radiographic studies, pulse oximetry, re-evaluation of patient's condition and participation in multidisciplinary rounds.  Kipp Brood, MD [facility] ICU Physician [place]  Pager: [phone] Mobile: [phone] After hours: [phone]. ,

## 2019-08-18 NOTE — Progress Notes (Signed)
Notified provider patient increasingly restless and agitated, trying to get out of bed without regard for safety despite precedex running at 0.7 mcg/min as entered by pharmacy. Provider ordered precedex be increased to max limit of 1.4 mcg/min.

## 2019-08-18 NOTE — Progress Notes (Signed)
PT Cancellation Note  Patient Details Name: Elizabeth [name] MRN: [id number] DOB: [birth year]   Cancelled Treatment:    Reason Eval/Treat Not Completed: (P) Patient not medically ready Pt extubated this morning and on BiPAP. PT will follow back this afternoon for Evaluation as able.   Keilyn Haggard B. Migdalia Dk PT, DPT Acute Rehabilitation Services Pager [phone] Office [phone]    [place] 08/18/2019, 10:52 AM

## 2019-08-18 NOTE — Procedures (Signed)
Extubation Procedure Note  Patient Details:   Name: Elizabeth [name] DOB: 04/08/1971 MRN: [id number]   Airway Documentation:    Vent end date: 08/18/19 Vent end time: 1000   Evaluation  O2 sats: stable throughout Complications: No apparent complications Patient did tolerate procedure well. Bilateral Breath Sounds: Clear   No   Pt was extubated per MD order and placed on 3 L [place]. Cuff leak was noted prior to extubation.   Ronaldo Miyamoto 08/18/2019, 10:22 AM

## 2019-08-18 NOTE — Evaluation (Signed)
Physical Therapy Evaluation Patient Details Name: Elizabeth [name] MRN: [id number] DOB: Nov 17, 1970 Today's Date: 08/18/2019   History of Present Illness  49 year old woman with a history of HIV, COPD, seizure d/o, polysubstance abuse, with new AMS.  Admitted with [facility] 08/17/19 after daughter found pill bottles half empty after new prescription filled 08/09/19. Intubated 3/16 and transferred to ICU  Clinical Impression  PT evaluation limited as pt with decreased cognition and communication as well as delayed reaction when following single step commands inconsistently. Pt is mod-total A for bed mobility, and mod-total A for sit>stand transfers. PT recommends SNF level rehab at discharge. PT will continue to follow acutely for more thorough evaluation.     Follow Up Recommendations SNF    Equipment Recommendations  Other (comment)(TBD at next venue)    Recommendations for Other Services OT consult     Precautions / Restrictions Precautions Precautions: Fall Restrictions Weight Bearing Restrictions: No      Mobility  Bed Mobility Overal bed mobility: Needs Assistance Bed Mobility: Supine to Sit;Sit to Supine     Supine to sit: Mod assist;HOB elevated Sit to supine: Total assist   General bed mobility comments: modA for LE management to EoB and intiation of trunk to upright however pt able to complete with min A for steadying in upright, total A as pt flopped back on bed after standing  Transfers Overall transfer level: Needs assistance Equipment used: 1 person hand held assist Transfers: Sit to/from Stand Sit to Stand: Mod assist;Total assist         General transfer comment: modA for power up and steadying on intial attempt, able to reach upright and then sat back down, attempted again for placement of clean pad and pt total A to clear hips      Balance Overall balance assessment: Needs assistance Sitting-balance support: Bilateral upper extremity supported;Feet  unsupported Sitting balance-Leahy Scale: Zero Sitting balance - Comments: requires outside assist, unable to place feet on floor due to height   Standing balance support: Bilateral upper extremity supported Standing balance-Leahy Scale: Zero                               Pertinent Vitals/Pain Pain Assessment: Faces Faces Pain Scale: Hurts even more Pain Location: generalized with standing  Pain Descriptors / Indicators: Grimacing Pain Intervention(s): Limited activity within patient's tolerance;Monitored during session;Repositioned    Home Living Family/patient expects to be discharged to:: Unsure                      Prior Function           Comments: no family present but from strength and age anticipate close to independent        Extremity/Trunk Assessment   Upper Extremity Assessment Upper Extremity Assessment: Difficult to assess due to impaired cognition;RUE deficits/detail;LUE deficits/detail RUE Deficits / Details: PROM WFL, does not follow commands for strength test  LUE Deficits / Details: PROM WFL, does not follow commands for strength test     Lower Extremity Assessment Lower Extremity Assessment: Difficult to assess due to impaired cognition;RLE deficits/detail;LLE deficits/detail RLE Deficits / Details: PROM WFL, does not follow commands for strength test  LLE Deficits / Details: PROM WFL, does not follow commands for strength test        Communication   Communication: Expressive difficulties;Other (comment)(only moaned throughout session )  Cognition Arousal/Alertness: Awake/alert Behavior During Therapy: Impulsive;Restless;Anxious;Agitated Overall  Cognitive Status: Difficult to assess                                 General Comments: no family present and pt only moaned, follows commands inconsistently and with delay       General Comments General comments (skin integrity, edema, etc.): VSS on 2L O2 via [place] SaO2  >93%O2 throughout session         Assessment/Plan    PT Assessment Patient needs continued PT services  PT Problem List Decreased activity tolerance;Decreased balance;Decreased mobility;Decreased coordination;Decreased cognition;Decreased safety awareness       PT Treatment Interventions DME instruction;Gait training;Functional mobility training;Therapeutic activities;Therapeutic exercise;Balance training;Cognitive remediation;Patient/family education    PT Goals (Current goals can be found in the Care Plan section)  Acute Rehab PT Goals Patient Stated Goal: none stated PT Goal Formulation: Patient unable to participate in goal setting Time For Goal Achievement: 09/01/19 Potential to Achieve Goals: Fair    Frequency Min 2X/week   Barriers to discharge Decreased caregiver support      Co-evaluation               AM-PAC PT "6 Clicks" Mobility  Outcome Measure Help needed turning from your back to your side while in a flat bed without using bedrails?: A Lot Help needed moving from lying on your back to sitting on the side of a flat bed without using bedrails?: Total Help needed moving to and from a bed to a chair (including a wheelchair)?: Total Help needed standing up from a chair using your arms (e.g., wheelchair or bedside chair)?: Total Help needed to walk in hospital room?: Total Help needed climbing 3-5 steps with a railing? : Total 6 Click Score: 7    End of Session Equipment Utilized During Treatment: Gait belt;Oxygen Activity Tolerance: Treatment limited secondary to agitation Patient left: in bed;with call bell/phone within reach;with bed alarm set Nurse Communication: Mobility status PT Visit Diagnosis: Unsteadiness on feet (R26.81);Other abnormalities of gait and mobility (R26.89);Muscle weakness (generalized) (M62.81);Difficulty in walking, not elsewhere classified (R26.2);Other symptoms and signs involving the nervous system (R29.898)    Time:  1419-1440 PT Time Calculation (min) (ACUTE ONLY): 21 min   Charges:   PT Evaluation $PT Eval Moderate Complexity: 1 Mod          Jaanvi Fizer B. Migdalia Dk PT, DPT Acute Rehabilitation Services Pager [phone] Office [phone]   [place] 08/18/2019, 5:50 PM

## 2019-08-19 ENCOUNTER — Inpatient Hospital Stay (HOSPITAL_COMMUNITY): Payer: Medicaid Other

## 2019-08-19 LAB — CBC
HCT: 33.5 % — ABNORMAL LOW (ref 36.0–46.0)
Hemoglobin: 10.5 g/dL — ABNORMAL LOW (ref 12.0–15.0)
MCH: 27.2 pg (ref 26.0–34.0)
MCHC: 31.3 g/dL (ref 30.0–36.0)
MCV: 86.8 fL (ref 80.0–100.0)
Platelets: 394 10*3/uL (ref 150–400)
RBC: 3.86 MIL/uL — ABNORMAL LOW (ref 3.87–5.11)
RDW: 19.7 % — ABNORMAL HIGH (ref 11.5–15.5)
WBC: 10.9 10*3/uL — ABNORMAL HIGH (ref 4.0–10.5)
nRBC: 0.2 % (ref 0.0–0.2)

## 2019-08-19 LAB — COMPREHENSIVE METABOLIC PANEL
ALT: 697 U/L — ABNORMAL HIGH (ref 0–44)
AST: 79 U/L — ABNORMAL HIGH (ref 15–41)
Albumin: 2.8 g/dL — ABNORMAL LOW (ref 3.5–5.0)
Alkaline Phosphatase: 117 U/L (ref 38–126)
Anion gap: 12 (ref 5–15)
BUN: 60 mg/dL — ABNORMAL HIGH (ref 6–20)
CO2: 17 mmol/L — ABNORMAL LOW (ref 22–32)
Calcium: 9.1 mg/dL (ref 8.9–10.3)
Chloride: 124 mmol/L — ABNORMAL HIGH (ref 98–111)
Creatinine, Ser: 1.38 mg/dL — ABNORMAL HIGH (ref 0.44–1.00)
GFR calc Af Amer: 53 mL/min — ABNORMAL LOW (ref >60)
GFR calc non Af Amer: 45 mL/min — ABNORMAL LOW (ref >60)
Glucose, Bld: 171 mg/dL — ABNORMAL HIGH (ref 70–99)
Potassium: 4.6 mmol/L (ref 3.5–5.1)
Sodium: 153 mmol/L — ABNORMAL HIGH (ref 135–145)
Total Bilirubin: 1.1 mg/dL (ref 0.3–1.2)
Total Protein: 5.6 g/dL — ABNORMAL LOW (ref 6.5–8.1)

## 2019-08-19 LAB — CK
Total CK: 89 U/L (ref 38–234)
Total CK: 102 U/L (ref 38–234)

## 2019-08-19 LAB — GLUCOSE, CAPILLARY
Glucose-Capillary: 159 mg/dL — ABNORMAL HIGH (ref 70–99)
Glucose-Capillary: 152 mg/dL — ABNORMAL HIGH (ref 70–99)
Glucose-Capillary: 133 mg/dL — ABNORMAL HIGH (ref 70–99)
Glucose-Capillary: 135 mg/dL — ABNORMAL HIGH (ref 70–99)
Glucose-Capillary: 157 mg/dL — ABNORMAL HIGH (ref 70–99)
Glucose-Capillary: 143 mg/dL — ABNORMAL HIGH (ref 70–99)

## 2019-08-19 LAB — PHOSPHORUS
Phosphorus: 4 mg/dL (ref 2.5–4.6)
Phosphorus: 3.9 mg/dL (ref 2.5–4.6)

## 2019-08-19 LAB — MAGNESIUM
Magnesium: 2.1 mg/dL (ref 1.7–2.4)
Magnesium: 2.2 mg/dL (ref 1.7–2.4)

## 2019-08-19 MED ORDER — VITAL HIGH PROTEIN PO LIQD: 1000.0000 mL | ORAL | Status: DC

## 2019-08-19 MED ORDER — LABETALOL HCL 5 MG/ML IV SOLN
10.0000 mg | INTRAVENOUS | Status: DC | PRN
  Administered 2019-08-19 – 2019-08-23 (×5): 10 mg via INTRAVENOUS
  Filled 2019-08-19 (×4): qty 4

## 2019-08-19 MED ORDER — HYDRALAZINE HCL 20 MG/ML IJ SOLN
10.0000 mg | INTRAMUSCULAR | Status: DC | PRN
  Administered 2019-08-19 – 2019-08-20 (×4): 20 mg via INTRAVENOUS
  Administered 2019-08-20 – 2019-08-23 (×4): 10 mg via INTRAVENOUS
  Administered 2019-08-23: 20 mg via INTRAVENOUS
  Filled 2019-08-19 (×9): qty 1

## 2019-08-19 MED ORDER — HALOPERIDOL LACTATE 5 MG/ML IJ SOLN
2.0000 mg | INTRAMUSCULAR | Status: AC | PRN
  Administered 2019-08-19 – 2019-08-20 (×5): 2 mg via INTRAVENOUS
  Filled 2019-08-19 (×5): qty 1

## 2019-08-19 MED ORDER — ORAL CARE MOUTH RINSE
15.0000 mL | Freq: Two times a day (BID) | OROMUCOSAL | Status: DC
  Administered 2019-08-19 – 2019-08-25 (×11): 15 mL via OROMUCOSAL

## 2019-08-19 MED ORDER — CLONIDINE HCL 0.2 MG PO TABS: 0.1000 mg | ORAL_TABLET | Freq: Three times a day (TID) | ORAL | Status: DC

## 2019-08-19 MED ORDER — FUROSEMIDE 10 MG/ML IJ SOLN
40.0000 mg | Freq: Once | INTRAMUSCULAR | Status: AC
  Administered 2019-08-19: 40 mg via INTRAVENOUS
  Filled 2019-08-19: qty 4

## 2019-08-19 MED ORDER — PANTOPRAZOLE SODIUM 40 MG PO PACK: 40.0000 mg | PACK | Freq: Every day | ORAL | Status: DC

## 2019-08-19 MED ORDER — DEXTROSE-NACL 5-0.45 % IV SOLN: INTRAVENOUS | Status: DC

## 2019-08-19 MED ORDER — CLONIDINE HCL 0.2 MG/24HR TD PTWK
0.2000 mg | MEDICATED_PATCH | TRANSDERMAL | Status: DC
  Administered 2019-08-19: 10:00:00 0.2 mg via TRANSDERMAL
  Filled 2019-08-19: qty 1

## 2019-08-19 MED ORDER — PRO-STAT SUGAR FREE PO LIQD: 30.0000 mL | Freq: Two times a day (BID) | ORAL | Status: DC

## 2019-08-19 MED ORDER — PANTOPRAZOLE SODIUM 40 MG IV SOLR
40.0000 mg | Freq: Every day | INTRAVENOUS | Status: DC
  Administered 2019-08-19: 22:00:00 40 mg via INTRAVENOUS
  Filled 2019-08-19: qty 40

## 2019-08-19 NOTE — Progress Notes (Signed)
Per pt's daughter Caryl Pina- it is okay to give pt's son, bradley, information but he is not to be the decision maker or change information.

## 2019-08-19 NOTE — Progress Notes (Signed)
SLP Cancellation Note  Patient Details Name: Elizabeth [name] MRN: [id number] DOB: 08/12/1970   Cancelled treatment:        Pt has been moaning most of the morning. Her RR was 41 when therapist looked at monitor and RN recommended holding swallow assessment until tomorrow. Will follow.    [place] Siren 08/19/2019, 1:06 PM  Orbie Pyo Colvin Caroli.Ed Risk analyst [phone] Office [phone]

## 2019-08-19 NOTE — Progress Notes (Signed)
NAME:  Elizabeth [name], MRN:  [id number], DOB:  [birth year], LOS: 4 ADMISSION DATE:  08/15/2019, CONSULTATION DATE:  08/15/19 REFERRING MD: [facility], CHIEF COMPLAINT:  Altered mental status  Brief History   49 year old woman with a history of HIV, COPD, seizure d/o, polysubstance abuse, with new AMS.  Daughter called EMS for suspected overdose, found pill bottles half empty after new prescription filled 08/09/19. To [facility] ED. Benzos and opiates pos on urine tox at 1230.  Given Geodon in ER, developed progressive somnolence > intubated.  Tx to [facility].   Past Medical History  HIV (stribild) COPD Depression (trazodone, seroquel) Migraines Seizures (depakote, last seizure 01/02/19) Hx lumpectomy?  Hx of VATS for wedge resection 2015  [facility] Events   3/14 Admit with suspected overose   Consults:  Poison Control   Procedures:  ETT 3/14 >>   Significant Diagnostic Tests:   CT Head 3/14 >> Normal CT appearance of the brain. Extensive soft tissue prominence within the nasal cavity may represent polyps or diffuse nasal mucosal swelling. Chronic right maxillary sinus disease.  ECHO 3/15 >> LVEF 55-60%, normal LV function, no RWMA, RV systolic function normal, moderately elevated PA systolic pressure, trivial MVR  Micro Data:  COVID 3/14 >> negative  Influenza A/B 3/14 >> negative  MRSA PCR 3/14 >> negative  Tracheal Aspirate 3/15 >> normal flora  PCP Smear 3/15 >>  BCx2 3/15 >>   Antimicrobials:  Cefepime 3/15 >> 3/15 Flagyl 3/15 >> 3/15  Ceftriaxone 3/15 >>  Interim history/subjective:  Hypertensive overnight.   Objective   Blood pressure (!) 158/105, pulse 86, temperature 97.6 F (36.4 C), temperature source Axillary, resp. rate (!) 24, height 5' (1.524 m), weight 74.2 kg, SpO2 97 %.    Vent Mode: BIPAP FiO2 (%):  [40 %] 40 % Set Rate:  [12 bmp] 12 bmp   Intake/Output Summary (Last 24 hours) at 08/19/2019 0910 Last data filed at 08/19/2019 0900 Gross per 24 hour   Intake 3051.1 ml  Output 500 ml  Net 2551.1 ml   Filed Weights   08/17/19 0500 08/18/19 0500 08/19/19 0330  Weight: 70 kg 73.8 kg 74.2 kg    Examination: General: small adult female lying in bed in NAD on vent   HEENT: MM pink/moist, ETT, pupils 13m reactive  Neuro: sedate, no response to verbal stimuli  CV: s1s2 RRR, no m/r/g PULM:  Non-labored on vent, lungs bilaterally with wheezing  GI: soft, bsx4 active  Extremities: warm/dry, no edema  Skin: no rashes or lesions  Resolved Hospital Problem list     Assessment & Plan:   Acute Hypoxemic Respiratory Failure - multifactorial in setting AMS, suspected Aspiration Pneumonitis with associated Bronchospasm. Extubated 3/17 PLAN -  -continue rocephin D4/7 -solumedrol 495mIV daily - stop date in place -f/u CXR now  -continue duoneb Q6 + BID pulmicort  -lasix 4073mV x 1 now - consider further diuresis after labs result  -respiratory status tenuous today - monitor closely for intubation needs, poor candidate for bipap given AMS  Acute Metabolic Encephalopathy  Suspected Overdose  Suspect in setting of overdose.  No hx of suicidality.  Oxycodone, gabapentin bottles empty after recent refill.  Elevated LFT's (on norco).  Ammonia 45, VPA, salicylate, acetaminophen negative PLAN -  -supportive care -Appreciate Poison Control  -minimize sedation medications  -early PT / OT efforts  -PAD protocol for RASS goal of 0 to -1  -begin process for IVC > [facility]  doesn't transfer across counties   Hypertension  PLAN -  Add Clonidine TID   Acute Liver Failure with hepatocellular injury pattern. Possibly medication related or due to shock liver (most likely). Improving. - No need for transfer at this time  -f/u CMET now   AKI secondary to  Rhabdomyolysis - Scr and CK improving.  hypernatremia PLAN -  F/u chem now Change MIVF to d5 1/2 NS  Hx Seizures  -continue keppra BID  -seizure precautions    Demand related  myocardial injury.  Trop 2703 --> 2241.  EKG without ST changes  -tele monitoring  - no specific therapy required.  Best practice:  Diet: place coretrak for TF given ongoing AMS  Pain/Anxiety/Delirium protocol (if indicated): PAD protocol  VAP protocol (if indicated): yes DVT prophylaxis: SCD's  GI prophylaxis: PPI  Glucose control: SSI Mobility: BR Code Status: Full Code Family Communication: daughter  Disposition: ICU  Labs   CBC: Recent Labs  Lab 08/13/19 1710 08/13/19 1710 08/15/19 1053 08/16/19 0126 08/16/19 0320 08/17/19 0913 08/18/19 0313  WBC 10.7*  --  12.0* 8.5  --  4.9 5.9  NEUTROABS  --   --  9.8*  --   --   --   --   HGB 10.6*   < > 11.2* 11.1* 9.2* 9.9* 9.6*  HCT 34.5*   < > 36.0 35.6* 27.0* 31.9* 30.9*  MCV 90.1  --  87.0 86.4  --  87.4 87.0  PLT 225  --  175 149*  --  218 262   < > = values in this interval not displayed.    Basic Metabolic Panel: Recent Labs  Lab 08/13/19 1710 08/13/19 1710 08/15/19 1053 08/15/19 1227 08/16/19 0126 08/16/19 0320 08/16/19 1107 08/16/19 1714 08/17/19 0307 08/17/19 0913 08/17/19 1624 08/18/19 0313  NA 134*   < > 137  --   --  142 145  --   --  147*  --  151*  K 3.8   < > 4.5  --   --  3.8 4.3  --   --  4.3  --  4.3  CL 101  --  100  --   --   --  110  --   --  115*  --  122*  CO2 25  --  25  --   --   --  18*  --   --  20*  --  17*  GLUCOSE 157*  --  122*  --   --   --  96  --   --  145*  --  170*  BUN 14  --  58*  --   --   --  54*  --   --  47*  --  48*  CREATININE 0.80  --  3.43*  --   --   --  3.36*  --   --  2.78*  --  2.07*  CALCIUM 8.3*  --  7.8*  --   --   --  8.0*  --   --  8.2*  --  8.5*  MG  --   --   --    < > 2.6*  --  2.3 2.3 2.0  --  2.1  --   PHOS  --   --   --   --  4.9*  --  4.2 4.1 4.2  --  4.0  --    < > = values in this interval not displayed.  GFR: Estimated Creatinine Clearance: 30.2 mL/min (A) (by C-G formula based on SCr of 2.07 mg/dL (H)). Recent Labs  Lab 08/15/19 1053  08/15/19 2336 08/16/19 0126 08/16/19 0208 08/17/19 0913 08/18/19 0313  PROCALCITON  --  7.01  --   --   --   --   WBC 12.0*  --  8.5  --  4.9 5.9  LATICACIDVEN 2.0*  --   --  1.6  --   --     Liver Function Tests: Recent Labs  Lab 08/15/19 1053 08/16/19 1107 08/17/19 0913 08/18/19 0313  AST 3,821* 1,660* 650* 220*  ALT 2,490* 1,807* 1,327* 1,026*  ALKPHOS 162* 124 125 119  BILITOT 1.6* 1.4* 0.9 1.0  PROT 6.2* 4.6* 4.5* 4.8*  ALBUMIN 3.5 2.3* 2.3* 2.4*   No results for input(s): LIPASE, AMYLASE in the last 168 hours. Recent Labs  Lab 08/15/19 1227  AMMONIA 45*    ABG    Component Value Date/Time   PHART 7.328 (L) 08/16/2019 0320   PCO2ART 42.1 08/16/2019 0320   PO2ART 83.0 08/16/2019 0320   HCO3 22.1 08/16/2019 0320   TCO2 23 08/16/2019 0320   ACIDBASEDEF 4.0 (H) 08/16/2019 0320   O2SAT 95.0 08/16/2019 0320     Coagulation Profile: Recent Labs  Lab 08/15/19 1227 08/16/19 1107 08/17/19 0913  INR 1.6* 1.6* 1.5*    Cardiac Enzymes: Recent Labs  Lab 08/16/19 0126 08/16/19 0126 08/17/19 0913 08/17/19 1624 08/18/19 0313 08/18/19 1651 08/19/19 0653  CKTOTAL 3,036*   < > 355* 362* 274* 265* 89  CKMB 31.7*  --   --   --   --   --   --    < > = values in this interval not displayed.    HbA1C: Hgb A1c MFr Bld  Date/Time Value Ref Range Status  08/16/2019 01:26 AM 5.8 (H) 4.8 - 5.6 % Final    Comment:    (NOTE) Pre diabetes:          5.7%-6.4% Diabetes:              >6.4% Glycemic control for   <7.0% adults with diabetes     CBG: Recent Labs  Lab 08/18/19 1513 08/18/19 1954 08/18/19 2318 08/19/19 0311 08/19/19 0749  GLUCAP 123* 155* 149* 159* 152*    CRITICAL CARE Total critical care time: 32 minutes  Critical care time was exclusive of separately billable procedures and treating other patients.  Critical care was necessary to treat or prevent imminent or life-threatening deterioration.  Critical care was time spent personally by  me on the following activities: development of treatment plan with patient and/or surrogate as well as nursing, discussions with consultants, evaluation of patient's response to treatment, examination of patient, obtaining history from patient or surrogate, ordering and performing treatments and interventions, ordering and review of laboratory studies, ordering and review of radiographic studies, pulse oximetry, re-evaluation of patient's condition and participation in multidisciplinary rounds.  Nickolas Madrid, NP Pulmonary/Critical Care Medicine  08/19/2019  9:10 AM

## 2019-08-19 NOTE — Progress Notes (Signed)
eLink Physician-Brief Progress Note Patient Name: Elizabeth [name] DOB: 1970/12/30 MRN: [id number]   Date of Service  08/19/2019  HPI/Events of Note  Called by RN because the patient is breathing 40 times per minute. She is on Precedex drip at 1.4, and has been receiving PRN Ativan as well for agitation. She was extubated almost 2 days ago.   eICU Interventions  Obtain ABG to help determine if this is primarily neuro-related (in which case I would expect a primary respiratory alkalosis) or compensatory to an underlying metabolic disturbance.     Intervention Category Major Interventions: Respiratory failure - evaluation and management  Charlott Rakes 08/19/2019, 11:54 PM

## 2019-08-19 NOTE — Progress Notes (Signed)
Video call with son. Patient  Unresponsive.

## 2019-08-19 NOTE — Progress Notes (Signed)
eLink Physician-Brief Progress Note Patient Name: Jalaila Caradonna DOB: [birth year] MRN: [id number]   Date of Service  08/19/2019  HPI/Events of Note  BP still elevated.  eICU Interventions  Hydralazine order changed to 10-20 mg iv Q 4 hours prn SBP > 160 mmHg.        Kerry Kass Atilano Covelli 08/19/2019, 12:07 AM

## 2019-08-19 NOTE — Progress Notes (Signed)
Nutrition Follow-up  DOCUMENTATION CODES:   Not applicable  INTERVENTION:   If patient does not pass swallow evaluation with SLP tomorrow, recommend Cortrak placement.  For TF, recommend: Jevity 1.2 at 60 ml/h to provide 1728 kcal, 80 gm protein, 1166 ml free water daily.  NUTRITION DIAGNOSIS:   Inadequate oral intake related to inability to eat as evidenced by NPO status.  Ongoing   GOAL:   Patient will meet greater than or equal to 90% of their needs  Unmet   MONITOR:   Diet advancement, PO intake, TF tolerance, Labs  REASON FOR ASSESSMENT:   Consult, Ventilator Enteral/tube feeding initiation and management  ASSESSMENT:   49 year old woman with a history of HIV, COPD, seizure d/o, polysubstance abuse, with new AMS.  Daughter called EMS for suspected overdose, found pill bottles half empty after new prescription filled 08/09/19. To [facility] ED. Benzos and opiates pos on urine tox at 1230.  Given Geodon in ER, developed progressive somnolence > intubated.  Tx to [facility].  Patient is no longer on vent support, extubated 3/17. TF off since extubation. SLP following with plans for swallow evaluation tomorrow. Remains NPO for now.  Received MD Consult for TF initiation and management. Enteral access is currently not available. If unable to safely advance PO diet tomorrow, recommend placing Cortrak feeding tube for enteral nutrition support.  Labs reviewed. Sodium 153 (H) CBG's: [phone]  Medications reviewed and include lasix, novolog, solu-medrol, precedex. IVF: NS at 100 ml/h  Weight up by 8.2 kg since admission. I/O + 16 L  Diet Order:   Diet Order            Diet NPO time specified  Diet effective now              EDUCATION NEEDS:   No education needs have been identified at this time  Skin:  Skin Assessment: Reviewed RN Assessment  Last BM:  3/17  Height:   Ht Readings from Last 1 Encounters:  08/15/19 5' (1.524 m)    Weight:   Wt  Readings from Last 1 Encounters:  08/19/19 74.2 kg   Admit weight 66 kg  Ideal Body Weight:  45.5 kg  BMI:  Body mass index is 31.95 kg/m.  Estimated Nutritional Needs:   Kcal:  1600-1800  Protein:  80-90 gm  Fluid:  >1.6 L    Molli Barrows, RD, LDN, CNSC Please refer to Amion for contact information.

## 2019-08-20 ENCOUNTER — Inpatient Hospital Stay (HOSPITAL_COMMUNITY): Payer: Medicaid Other

## 2019-08-20 DIAGNOSIS — R4182 Altered mental status, unspecified: Secondary | ICD-10-CM

## 2019-08-20 LAB — POCT I-STAT 7, (LYTES, BLD GAS, ICA,H+H)
Acid-base deficit: 2 mmol/L (ref 0.0–2.0)
Bicarbonate: 22.7 mmol/L (ref 20.0–28.0)
Calcium, Ion: 1.31 mmol/L (ref 1.15–1.40)
HCT: 30 % — ABNORMAL LOW (ref 36.0–46.0)
Hemoglobin: 10.2 g/dL — ABNORMAL LOW (ref 12.0–15.0)
O2 Saturation: 93 %
Potassium: 3.7 mmol/L (ref 3.5–5.1)
Sodium: 154 mmol/L — ABNORMAL HIGH (ref 135–145)
TCO2: 24 mmol/L (ref 22–32)
pCO2 arterial: 35.4 mmHg (ref 32.0–48.0)
pH, Arterial: 7.414 (ref 7.350–7.450)
pO2, Arterial: 65 mmHg — ABNORMAL LOW (ref 83.0–108.0)

## 2019-08-20 LAB — CD4/CD8 (T-HELPER/T-SUPPRESSOR CELL)
CD4 absolute: 262 /uL — ABNORMAL LOW (ref 400–1790)
CD4%: 30 % — ABNORMAL LOW (ref 33–65)
CD8 T Cell Abs: 284 /uL (ref 190–1000)
CD8tox: 33 % (ref 12–40)
Ratio: 0.92 — ABNORMAL LOW (ref 1.0–3.0)
Total lymphocyte count: 874 /uL — ABNORMAL LOW (ref 1000–4000)

## 2019-08-20 LAB — GLUCOSE, CAPILLARY
Glucose-Capillary: 151 mg/dL — ABNORMAL HIGH (ref 70–99)
Glucose-Capillary: 134 mg/dL — ABNORMAL HIGH (ref 70–99)
Glucose-Capillary: 139 mg/dL — ABNORMAL HIGH (ref 70–99)
Glucose-Capillary: 137 mg/dL — ABNORMAL HIGH (ref 70–99)
Glucose-Capillary: 146 mg/dL — ABNORMAL HIGH (ref 70–99)

## 2019-08-20 LAB — PHOSPHORUS
Phosphorus: 3.7 mg/dL (ref 2.5–4.6)
Phosphorus: 3.3 mg/dL (ref 2.5–4.6)

## 2019-08-20 LAB — MAGNESIUM
Magnesium: 2.2 mg/dL (ref 1.7–2.4)
Magnesium: 2.3 mg/dL (ref 1.7–2.4)

## 2019-08-20 LAB — HIV-1 RNA QUANT-NO REFLEX-BLD
HIV 1 RNA Quant: <20 copies/mL
LOG10 HIV-1 RNA: UNDETERMINED log10copy/mL

## 2019-08-20 LAB — CK: Total CK: 52 U/L (ref 38–234)

## 2019-08-20 MED ORDER — LEVETIRACETAM 100 MG/ML PO SOLN
500.0000 mg | Freq: Two times a day (BID) | ORAL | Status: DC
  Administered 2019-08-20 – 2019-08-26 (×13): 500 mg
  Filled 2019-08-20 (×2): qty 10
  Filled 2019-08-20 (×2): qty 5
  Filled 2019-08-20: qty 10
  Filled 2019-08-20 (×2): qty 5
  Filled 2019-08-20 (×3): qty 10
  Filled 2019-08-20: qty 5
  Filled 2019-08-20 (×3): qty 10

## 2019-08-20 MED ORDER — OSMOLITE 1.2 CAL PO LIQD
1000.0000 mL | ORAL | Status: DC
  Filled 2019-08-20: qty 1000

## 2019-08-20 MED ORDER — LORAZEPAM 2 MG/ML IJ SOLN
2.0000 mg | Freq: Once | INTRAMUSCULAR | Status: AC
  Administered 2019-08-20: 2 mg via INTRAVENOUS

## 2019-08-20 MED ORDER — LORAZEPAM 2 MG/ML IJ SOLN
2.0000 mg | Freq: Once | INTRAMUSCULAR | Status: AC
  Administered 2019-08-20: 2 mg via INTRAVENOUS
  Filled 2019-08-20: qty 1

## 2019-08-20 MED ORDER — LORAZEPAM 2 MG/ML IJ SOLN
INTRAMUSCULAR | Status: AC
  Filled 2019-08-20: qty 1

## 2019-08-20 MED ORDER — CHLORHEXIDINE GLUCONATE CLOTH 2 % EX PADS
6.0000 | MEDICATED_PAD | Freq: Every day | CUTANEOUS | Status: DC
  Administered 2019-08-21 – 2019-08-26 (×6): 6 via TOPICAL

## 2019-08-20 MED ORDER — QUETIAPINE FUMARATE 25 MG PO TABS
25.0000 mg | ORAL_TABLET | Freq: Two times a day (BID) | ORAL | Status: DC
Start: 2019-08-20 — End: 2019-08-26
  Administered 2019-08-20 – 2019-08-26 (×13): 25 mg
  Filled 2019-08-20 (×13): qty 1

## 2019-08-20 MED ORDER — JEVITY 1.2 CAL PO LIQD
1000.0000 mL | ORAL | Status: DC
  Administered 2019-08-20 – 2019-08-22 (×3): 1000 mL
  Administered 2019-08-24: 60 mL/h
  Filled 2019-08-20 (×7): qty 1000

## 2019-08-20 NOTE — Progress Notes (Signed)
Nutrition Follow-up  DOCUMENTATION CODES:   Not applicable  INTERVENTION:  Once Cortrak NGT placed and ready for use, Initiate TF with Jevity 1.2 formula at goal rate of 60 ml/h (1440 ml per day) to provide 1728 kcals, 80 gm protein, 1166 ml free water daily.  NUTRITION DIAGNOSIS:   Inadequate oral intake related to inability to eat as evidenced by NPO status; ongoing  GOAL:   Patient will meet greater than or equal to 90% of their needs; progressing  MONITOR:   TF tolerance, Skin, Weight trends, Labs, I & O's  REASON FOR ASSESSMENT:   Consult, Ventilator Enteral/tube feeding initiation and management  ASSESSMENT:   49 year old woman with a history of HIV, COPD, seizure d/o, polysubstance abuse, with new AMS.  Daughter called EMS for suspected overdose, found pill bottles half empty after new prescription filled 08/09/19. To [facility] ED. Benzos and opiates pos on urine tox at 1230.  Given Geodon in ER, developed progressive somnolence > intubated.  Tx to [facility]. Extubated 3/17.   Pt lethargic with ongoing AMS and continues on NPO status. Cortrak NGT ordered for placement today. RD to order tube feeding. May initiate enteral feeding via Cortrak once tube placed and ready for use.  Labs and medications reviewed. CBG's: [phone]  Weight up by 3.5 kg since admission. I/O + 12.8 L  Diet Order:   Diet Order            Diet NPO time specified  Diet effective now              EDUCATION NEEDS:   No education needs have been identified at this time  Skin:  Skin Assessment: Reviewed RN Assessment  Last BM:  3/17  Height:   Ht Readings from Last 1 Encounters:  08/15/19 5' (1.524 m)    Weight:   Wt Readings from Last 1 Encounters:  08/20/19 69.5 kg    Ideal Body Weight:  45.5 kg  BMI:  Body mass index is 29.92 kg/m.  Estimated Nutritional Needs:   Kcal:  1600-1800  Protein:  80-90 gm  Fluid:  >1.6 L    Corrin Parker, MS, RD, LDN RD pager  number/after hours weekend pager number on Amion.

## 2019-08-20 NOTE — Evaluation (Signed)
Clinical/Bedside Swallow Evaluation Patient Details  Name: Elizabeth [name] MRN: [id number] Date of Birth: [birth year]  Today's Date: 08/20/2019 Time: SLP Start Time (ACUTE ONLY): 9622 SLP Stop Time (ACUTE ONLY): 1535 SLP Time Calculation (min) (ACUTE ONLY): 16 min  Past Medical History:  Past Medical History:  Diagnosis Date  . Asthma    uses Albuterol inhaler daily as needed  . COPD (chronic obstructive pulmonary disease) ([place])   . Depression    takes Trazodone and Seroquel nightly  . Dizziness   . Dysphagia   . Headache(784.0)   . History of migraine    last one about 2wks ago  . HIV (human immunodeficiency virus infection) ([place])    takes Stribild daily  . Lung cancer ([place])   . Nocturia   . Seizures ([place])    takes Depakote daily-last one about 6 months ago as of 01/02/19  . Shortness of breath    sitting/lying/exertion  . Substance abuse ([place])   . Urinary frequency    Past Surgical History:  Past Surgical History:  Procedure Laterality Date  . BREAST SURGERY Left    mass removed from left breast-fatty tissue  . CESAREAN SECTION  38yr ago  . VIDEO ASSISTED THORACOSCOPY (VATS)/WEDGE RESECTION Right 07/05/2013   Procedure: VIDEO ASSISTED THORACOSCOPY (VATS)/WEDGE RESECTION, Right Upper Lobectomy with lymph node disecction and OnQ placement;  Surgeon: SMelrose Nakayama MD;  [place] [facility]  Service: Thoracic;  Laterality: Right;  (R)VATS, WEDGE RESECTION, POSSIBLE LOBECTOMY, POSSIBLE CHEST WALL RESECTION  . VIDEO BRONCHOSCOPY Bilateral 05/05/2013   Procedure: VIDEO BRONCHOSCOPY WITH FLUORO;  Surgeon: MTanda Rockers MD;  Location: [facility];  Service: Cardiopulmonary;  Laterality: Bilateral;   HPI:  49year old woman with a history of HIV, COPD, seizure d/o, polysubstance abuse, with new AMS. Daughter called EMS for suspected overdose, found pill bottles half empty after new prescription filled 3/8/2Benzos and opiates pos on urine tox. Intubated 3/14-3/17. Acute Hypoxemic  Respiratory Insufficiency - multifactorial in setting AMS, suspected Aspiration Pneumonitis with associated Bronchospasm; acute metabloic encephalopathy, AKI secondary to rhabdomyolosis, acute liver failure.    Assessment / Plan / Recommendation Clinical Impression  Increased risk of aspiration given decreased alertness, awareness and current cognitive impairments with daughter, ACaryl Pinapresent in setting of suspected unintentional overdose. Pt kept eyes closed, periodic moans and followed commands to open eyes x 1 and shook head no at end of assessment. Dry lingual secretions removed followed by ice chips and teaspoon water with delayed manipulation and propulsion. Delayed swallow initiation suspected. Recommend she continue NPO with further ST intervention to further evaluate abilty to safely take po's.      SLP Visit Diagnosis: Dysphagia, unspecified (R13.10)    Aspiration Risk  Moderate aspiration risk    Diet Recommendation NPO   Medication Administration: Via alternative means    Other  Recommendations Oral Care Recommendations: Oral care QID   Follow up Recommendations 24 hour supervision/assistance      Frequency and Duration min 2x/week  2 weeks       Prognosis Prognosis for Safe Diet Advancement: (fair-good) Barriers to Reach Goals: Cognitive deficits      Swallow Study   General HPI: 49year old woman with a history of HIV, COPD, seizure d/o, polysubstance abuse, with new AMS. Daughter called EMS for suspected overdose, found pill bottles half empty after new prescription filled 3/8/2Benzos and opiates pos on urine tox. Intubated 3/14-3/17. Acute Hypoxemic Respiratory Insufficiency - multifactorial in setting AMS, suspected Aspiration Pneumonitis with associated Bronchospasm;  acute metabloic encephalopathy, AKI secondary to rhabdomyolosis, acute liver failure.  Type of Study: Bedside Swallow Evaluation Previous Swallow Assessment: none Diet Prior to this Study: NPO;NG  Tube Temperature Spikes Noted: No Respiratory Status: Nasal cannula History of Recent Intubation: Yes Length of Intubations (days): 4 days Date extubated: 08/18/19 Behavior/Cognition: Lethargic/Drowsy;Confused Oral Cavity Assessment: Dry;Dried secretions(on tongue) Oral Care Completed by SLP: Yes Oral Cavity - Dentition: Edentulous(has dentures, doesn't wear per dtr, Caryl Pina) Vision: (difficulty focusing) Self-Feeding Abilities: Total assist Patient Positioning: Upright in bed Baseline Vocal Quality: Low vocal intensity Volitional Cough: Cognitively unable to elicit Volitional Swallow: Unable to elicit    Oral/Motor/Sensory Function Overall Oral Motor/Sensory Function: Other (comment)(no focal weakness)   Ice Chips Ice chips: Impaired Presentation: Spoon Oral Phase Impairments: Reduced labial seal;Reduced lingual movement/coordination;Poor awareness of bolus Oral Phase Functional Implications: Prolonged oral transit;Oral holding   Thin Liquid Thin Liquid: Impaired Presentation: Spoon Oral Phase Impairments: Reduced lingual movement/coordination;Reduced labial seal;Poor awareness of bolus Oral Phase Functional Implications: Right anterior spillage;Left anterior spillage Pharyngeal  Phase Impairments: Suspected delayed Swallow;Multiple swallows    Nectar Thick Nectar Thick Liquid: Not tested   Honey Thick Honey Thick Liquid: Not tested   Puree Puree: Not tested   Solid     Solid: Not tested      Houston Siren 08/20/2019,5:29 PM

## 2019-08-20 NOTE — Procedures (Signed)
Patient Name: Elizabeth [name]  MRN: [id number]  Epilepsy Attending: Lora Havens  Referring Physician/Provider: Dr. Dyann Ruddle Date: 08/20/2019 Duration: 24.52 minutes  Patient history: 49 year old female with history of seizures due to polysubstance abuse presented with altered mental status.  EEG to evaluate for seizures.  Level of alertness: Lethargic  AEDs during EEG study: Keppra, Ativan  Technical aspects: This EEG study was done with scalp electrodes positioned according to the 10-20 International system of electrode placement. Electrical activity was acquired at a sampling rate of 500Hz  and reviewed with a high frequency filter of 70Hz  and a low frequency filter of 1Hz . EEG data were recorded continuously and digitally stored.   Description: EEG showed continuous generalized 3 to 7 Hz theta-delta slowing as well as intermittent 2 to 3-second.'s of generalized EEG attenuation.  Hyperventilation and photic stimulation were not performed.  Abnormality -Continuous slow, generalized -Background attenuation, generalized  IMPRESSION: This study is suggestive of severe diffuse encephalopathy, nonspecific etiology. No seizures or epileptiform discharges were seen throughout the recording.  [name] Barbra Sarks

## 2019-08-20 NOTE — Progress Notes (Signed)
Took patient to MRI around 1600. Still on precedex. Administered IV ativan 2 mg. Patient still very restless. Called my unit to see if a co-worker can bring down more iv ativan or haldol. Co-worker, April RN, walked down and gave me IV ativan and haldol per MD order from [place]. Administered only 2 mg of IV Haldol- no effect. Waited a while. Then administered IV ativan 2 mg one time order- still not much effect. Patient very restless, moving legs and head. MRI brain unable to be done d/t being very restless. Will report to oncoming night RN.

## 2019-08-20 NOTE — Progress Notes (Signed)
EEG complete - results pending 

## 2019-08-20 NOTE — Plan of Care (Signed)
Talked in front of patient to daughter over the phone about plan of care for the evening, pain management and retrying the MRI with some teach back displayed by the family

## 2019-08-20 NOTE — Progress Notes (Signed)
NAME:  Elizabeth [name], MRN:  [id number], DOB:  05/20/1971, LOS: 5 ADMISSION DATE:  08/15/2019, CONSULTATION DATE:  08/15/19 REFERRING MD: [facility], CHIEF COMPLAINT:  Altered mental status  Brief History   49 year old woman with a history of HIV, COPD, seizure d/o, polysubstance abuse, with new AMS.  Daughter called EMS for suspected overdose, found pill bottles half empty after new prescription filled 08/09/19. To [facility] ED. Benzos and opiates pos on urine tox at 1230.  Given Geodon in ER, developed progressive somnolence > intubated.  Tx to [facility].   Past Medical History  HIV (stribild) COPD Depression (trazodone, seroquel) Migraines Seizures (depakote, last seizure 01/02/19) Hx lumpectomy?  Hx of VATS for wedge resection 2015  [facility] Events   3/14 Admit with suspected overose   Consults:  Poison Control   Procedures:  ETT 3/14 >>   Significant Diagnostic Tests:   CT Head 3/14 >> Normal CT appearance of the brain. Extensive soft tissue prominence within the nasal cavity may represent polyps or diffuse nasal mucosal swelling. Chronic right maxillary sinus disease.  ECHO 3/15 >> LVEF 55-60%, normal LV function, no RWMA, RV systolic function normal, moderately elevated PA systolic pressure, trivial MVR  Micro Data:  COVID 3/14 >> negative  Influenza A/B 3/14 >> negative  MRSA PCR 3/14 >> negative  Tracheal Aspirate 3/15 >> normal flora  PCP Smear 3/15 >>  BCx2 3/15 >>   Antimicrobials:  Cefepime 3/15 >> 3/15 Flagyl 3/15 >> 3/15  Ceftriaxone 3/15 >>  Interim history/subjective:  Remains significantly altered: agitation, calling out and moves all limbs but not redirectable.  Objective   Blood pressure 125/82, pulse 84, temperature 97.8 F (36.6 C), temperature source Oral, resp. rate (!) 23, height 5' (1.524 m), weight 69.5 kg, SpO2 97 %.        Intake/Output Summary (Last 24 hours) at 08/20/2019 0850 Last data filed at 08/20/2019 0800 Gross per 24 hour  Intake  974.9 ml  Output 4700 ml  Net -3725.1 ml   Filed Weights   08/18/19 0500 08/19/19 0330 08/20/19 0420  Weight: 73.8 kg 74.2 kg 69.5 kg    Examination: General: small adult female lying in bed in NAD on Precedex. HEENT: MM pink/dry, pupils 37m reactive Neuro: On Precedex, eyes open and roving eye movements.  CV: s1s2 RRR, no m/r/g, no JVD PULM:  Chest clear bilaterally   GI: soft, bsx4 active  Extremities: warm/dry, no edema  Skin: no rashes or lesions  Resolved Hospital Problem list     Assessment & Plan:   Acute Hypoxemic Respiratory Insufficiency - multifactorial in setting AMS, suspected Aspiration Pneumonitis with associated Bronchospasm. Extubated 3/17 - Improved following diuresis - no further diuresis today. - would benefit from mobilization once more awake  Acute Metabolic Encephalopathy  Suspected Overdose - unintentional according to daugher. Suspect in setting of overdose.  No hx of suicidality.  Oxycodone, gabapentin bottles empty after recent refill.  Elevated LFT's (on norco).  Ammonia 45, VPA, salicylate, acetaminophen negative I am now concerned by prolonged duration of encephalopathy without clear improvement.  Need to rule out other potential causes. -EEG today to rule out nonconvulsive status -Consider MRI to rule out PRES as has had episodes of hypertension -We will hold on lumbar puncture given absence of fever or leukocytosis.  Hypertension  PLAN -  Add Clonidine TID   Acute Liver Failure with hepatocellular injury pattern. Possibly medication related or due to shock liver (most likely). Improving. - No need  for transfer at this time    AKI secondary to  Rhabdomyolysis - Scr and CK improving.  hypernatremia -Core track tube today to initiate feeds and free water  Hx Seizures  -continue keppra BID  -seizure precautions  -EEG to rule out subclinical status.   Demand related myocardial injury.  Trop 2703 --> 2241.  EKG without ST changes   -tele monitoring  - no specific therapy required.  Best practice:  Diet: place coretrak for TF given ongoing AMS  Pain/Anxiety/Delirium protocol (if indicated): PAD protocol  VAP protocol (if indicated): yes DVT prophylaxis: SCD's  GI prophylaxis: PPI  Glucose control: SSI Mobility: BR Code Status: Full Code Family Communication: daughter  Disposition: ICU  Labs   CBC: Recent Labs  Lab 08/15/19 1053 08/15/19 1053 08/16/19 0126 08/16/19 0126 08/16/19 0320 08/17/19 0913 08/18/19 0313 08/19/19 0933 08/20/19 0007  WBC 12.0*  --  8.5  --   --  4.9 5.9 10.9*  --   NEUTROABS 9.8*  --   --   --   --   --   --   --   --   HGB 11.2*   < > 11.1*   < > 9.2* 9.9* 9.6* 10.5* 10.2*  HCT 36.0   < > 35.6*   < > 27.0* 31.9* 30.9* 33.5* 30.0*  MCV 87.0  --  86.4  --   --  87.4 87.0 86.8  --   PLT 175  --  149*  --   --  218 262 394  --    < > = values in this interval not displayed.    Basic Metabolic Panel: Recent Labs  Lab 08/15/19 1053 08/15/19 1227 08/16/19 1107 08/16/19 1714 08/17/19 0307 08/17/19 0913 08/17/19 1624 08/18/19 0313 08/19/19 0933 08/19/19 1700 08/20/19 0007 08/20/19 0628  NA 137   < > 145  --   --  147*  --  151* 153*  --  154*  --   K 4.5   < > 4.3  --   --  4.3  --  4.3 4.6  --  3.7  --   CL 100  --  110  --   --  115*  --  122* 124*  --   --   --   CO2 25  --  18*  --   --  20*  --  17* 17*  --   --   --   GLUCOSE 122*  --  96  --   --  145*  --  170* 171*  --   --   --   BUN 58*  --  54*  --   --  47*  --  48* 60*  --   --   --   CREATININE 3.43*  --  3.36*  --   --  2.78*  --  2.07* 1.38*  --   --   --   CALCIUM 7.8*  --  8.0*  --   --  8.2*  --  8.5* 9.1  --   --   --   MG  --    < > 2.3   < > 2.0  --  2.1  --  2.1 2.2  --  2.2  PHOS  --    < > 4.2   < > 4.2  --  4.0  --  4.0 3.9  --  3.7   < > = values in this interval not displayed.  GFR: Estimated Creatinine Clearance: 43.8 mL/min (A) (by C-G formula based on SCr of 1.38 mg/dL (H)). Recent  Labs  Lab 08/15/19 1053 08/15/19 1053 08/15/19 2336 08/16/19 0126 08/16/19 0208 08/17/19 0913 08/18/19 0313 08/19/19 0933  PROCALCITON  --   --  7.01  --   --   --   --   --   WBC 12.0*   < >  --  8.5  --  4.9 5.9 10.9*  LATICACIDVEN 2.0*  --   --   --  1.6  --   --   --    < > = values in this interval not displayed.    Liver Function Tests: Recent Labs  Lab 08/15/19 1053 08/16/19 1107 08/17/19 0913 08/18/19 0313 08/19/19 0933  AST 3,821* 1,660* 650* 220* 79*  ALT 2,490* 1,807* 1,327* 1,026* 697*  ALKPHOS 162* 124 125 119 117  BILITOT 1.6* 1.4* 0.9 1.0 1.1  PROT 6.2* 4.6* 4.5* 4.8* 5.6*  ALBUMIN 3.5 2.3* 2.3* 2.4* 2.8*   No results for input(s): LIPASE, AMYLASE in the last 168 hours. Recent Labs  Lab 08/15/19 1227  AMMONIA 45*    ABG    Component Value Date/Time   PHART 7.414 08/20/2019 0007   PCO2ART 35.4 08/20/2019 0007   PO2ART 65.0 (L) 08/20/2019 0007   HCO3 22.7 08/20/2019 0007   TCO2 24 08/20/2019 0007   ACIDBASEDEF 2.0 08/20/2019 0007   O2SAT 93.0 08/20/2019 0007     Coagulation Profile: Recent Labs  Lab 08/15/19 1227 08/16/19 1107 08/17/19 0913  INR 1.6* 1.6* 1.5*    Cardiac Enzymes: Recent Labs  Lab 08/16/19 0126 08/17/19 0913 08/18/19 0313 08/18/19 1651 08/19/19 0653 08/19/19 1700 08/20/19 0628  CKTOTAL 3,036*   < > 274* 265* 89 102 52  CKMB 31.7*  --   --   --   --   --   --    < > = values in this interval not displayed.    HbA1C: Hgb A1c MFr Bld  Date/Time Value Ref Range Status  08/16/2019 01:26 AM 5.8 (H) 4.8 - 5.6 % Final    Comment:    (NOTE) Pre diabetes:          5.7%-6.4% Diabetes:              >6.4% Glycemic control for   <7.0% adults with diabetes     CBG: Recent Labs  Lab 08/19/19 1523 08/19/19 1920 08/19/19 2311 08/20/19 0317 08/20/19 0732  GLUCAP 135* 157* 143* [place], MD [facility] ICU Physician [place]  Pager: [phone] Mobile: [phone] After hours:  [phone].  08/20/2019, 9:01 AM

## 2019-08-20 NOTE — Progress Notes (Signed)
PT Cancellation Note  Patient Details Name: Elizabeth [name] MRN: [id number] DOB: 03-11-1971   Cancelled Treatment:    Reason Eval/Treat Not Completed: (P) Medical issues which prohibited therapy Pt continues to receive Precedex and is only responding to painful stimuli. PT will follow back next week for treatment.   Jahzion Brogden B. Migdalia Dk PT, DPT Acute Rehabilitation Services Pager [phone] Office [phone]    [place] 08/20/2019, 9:50 AM

## 2019-08-20 NOTE — Procedures (Signed)
Cortrak  Person Inserting Tube:  Kaislyn Gulas, RD Tube Type:  Cortrak - 43 inches Tube Location:  Left nare Initial Placement:  Stomach Secured by: Bridle Technique Used to Measure Tube Placement:  Documented cm marking at nare/ corner of mouth Cortrak Secured At:  65 cm   No x-ray is required. RN may begin using tube.   If the tube becomes dislodged please keep the tube and contact the Cortrak team at [email or web address] ([email or web address]) for replacement.  If after hours and replacement cannot be delayed, place a NG tube and confirm placement with an abdominal x-ray.    Mariana Single RD, LDN Clinical Nutrition Pager listed in [place]

## 2019-08-21 ENCOUNTER — Inpatient Hospital Stay (HOSPITAL_COMMUNITY): Payer: Medicaid Other

## 2019-08-21 ENCOUNTER — Encounter (HOSPITAL_COMMUNITY): Payer: Self-pay | Admitting: Pulmonary Disease

## 2019-08-21 DIAGNOSIS — G9340 Encephalopathy, unspecified: Secondary | ICD-10-CM

## 2019-08-21 DIAGNOSIS — R69 Illness, unspecified: Secondary | ICD-10-CM

## 2019-08-21 DIAGNOSIS — T50904A Poisoning by unspecified drugs, medicaments and biological substances, undetermined, initial encounter: Secondary | ICD-10-CM

## 2019-08-21 LAB — COMPREHENSIVE METABOLIC PANEL
ALT: 289 U/L — ABNORMAL HIGH (ref 0–44)
AST: 26 U/L (ref 15–41)
Albumin: 2.6 g/dL — ABNORMAL LOW (ref 3.5–5.0)
Alkaline Phosphatase: 94 U/L (ref 38–126)
Anion gap: 10 (ref 5–15)
BUN: 66 mg/dL — ABNORMAL HIGH (ref 6–20)
CO2: 20 mmol/L — ABNORMAL LOW (ref 22–32)
Calcium: 8.6 mg/dL — ABNORMAL LOW (ref 8.9–10.3)
Chloride: 128 mmol/L — ABNORMAL HIGH (ref 98–111)
Creatinine, Ser: 1.2 mg/dL — ABNORMAL HIGH (ref 0.44–1.00)
GFR calc Af Amer: >60 mL/min (ref >60)
GFR calc non Af Amer: 54 mL/min — ABNORMAL LOW (ref >60)
Glucose, Bld: 146 mg/dL — ABNORMAL HIGH (ref 70–99)
Potassium: 3.5 mmol/L (ref 3.5–5.1)
Sodium: 158 mmol/L — ABNORMAL HIGH (ref 135–145)
Total Bilirubin: 0.8 mg/dL (ref 0.3–1.2)
Total Protein: 5 g/dL — ABNORMAL LOW (ref 6.5–8.1)

## 2019-08-21 LAB — POCT I-STAT 7, (LYTES, BLD GAS, ICA,H+H)
Bicarbonate: 23.5 mmol/L (ref 20.0–28.0)
Calcium, Ion: 1.32 mmol/L (ref 1.15–1.40)
HCT: 31 % — ABNORMAL LOW (ref 36.0–46.0)
Hemoglobin: 10.5 g/dL — ABNORMAL LOW (ref 12.0–15.0)
O2 Saturation: 95 %
Potassium: 3.2 mmol/L — ABNORMAL LOW (ref 3.5–5.1)
Sodium: 161 mmol/L (ref 135–145)
TCO2: 24 mmol/L (ref 22–32)
pCO2 arterial: 32.1 mmHg (ref 32.0–48.0)
pH, Arterial: 7.472 — ABNORMAL HIGH (ref 7.350–7.450)
pO2, Arterial: 71 mmHg — ABNORMAL LOW (ref 83.0–108.0)

## 2019-08-21 LAB — GLUCOSE, CAPILLARY
Glucose-Capillary: 135 mg/dL — ABNORMAL HIGH (ref 70–99)
Glucose-Capillary: 150 mg/dL — ABNORMAL HIGH (ref 70–99)
Glucose-Capillary: 153 mg/dL — ABNORMAL HIGH (ref 70–99)
Glucose-Capillary: 155 mg/dL — ABNORMAL HIGH (ref 70–99)
Glucose-Capillary: 160 mg/dL — ABNORMAL HIGH (ref 70–99)
Glucose-Capillary: 183 mg/dL — ABNORMAL HIGH (ref 70–99)
Glucose-Capillary: 210 mg/dL — ABNORMAL HIGH (ref 70–99)

## 2019-08-21 LAB — CULTURE, BLOOD (ROUTINE X 2)
Culture: NO GROWTH
Culture: NO GROWTH

## 2019-08-21 MED ORDER — MIDAZOLAM HCL 2 MG/2ML IJ SOLN
1.0000 mg | INTRAMUSCULAR | Status: DC | PRN
  Administered 2019-08-21: 1 mg via INTRAVENOUS

## 2019-08-21 MED ORDER — PREDNISONE 10 MG PO TABS
40.0000 mg | ORAL_TABLET | Freq: Every day | ORAL | Status: AC
  Administered 2019-08-21 – 2019-08-24 (×4): 40 mg
  Filled 2019-08-21 (×2): qty 4
  Filled 2019-08-21: qty 8

## 2019-08-21 MED ORDER — FREE WATER
300.0000 mL | Status: DC
  Administered 2019-08-21 (×5): 300 mL

## 2019-08-21 MED ORDER — PHENOL 1.4 % MT LIQD
1.0000 | OROMUCOSAL | Status: DC | PRN
  Administered 2019-08-21: 1 via OROMUCOSAL
  Filled 2019-08-21: qty 177

## 2019-08-21 MED ORDER — PREDNISONE 10 MG PO TABS
40.0000 mg | ORAL_TABLET | Freq: Every day | ORAL | Status: DC
  Filled 2019-08-21: qty 4

## 2019-08-21 MED ORDER — EMTRICITABINE-TENOFOVIR AF 200-25 MG PO TABS
1.0000 | ORAL_TABLET | Freq: Every day | ORAL | Status: DC
  Administered 2019-08-21 – 2019-08-23 (×2): 1
  Filled 2019-08-21 (×4): qty 1

## 2019-08-21 MED ORDER — MIDAZOLAM HCL 2 MG/2ML IJ SOLN
1.0000 mg | INTRAMUSCULAR | Status: DC | PRN
  Filled 2019-08-21: qty 2

## 2019-08-21 MED ORDER — DOLUTEGRAVIR SODIUM 50 MG PO TABS
50.0000 mg | ORAL_TABLET | Freq: Every day | ORAL | Status: DC
  Administered 2019-08-22: 50 mg via ORAL
  Filled 2019-08-21 (×4): qty 1

## 2019-08-21 NOTE — Plan of Care (Signed)
Discussed with patient and husband plan of care for the evening, pain management, withdrawal medications and something for sore throat with some teach back displayed by both.  Husband leaving just before visiting hours were over.  He will be back tomorrow morning.

## 2019-08-21 NOTE — Consult Note (Signed)
Discussed case with pharmacy and Dr. Loanne Drilling.  CD4 down, likely from acute illness.  HIV RNA suppressed.  Continue with medication - Tivicay + Descovy now and can continue with Genvoya at discharge.   Thayer Headings, MD

## 2019-08-21 NOTE — Progress Notes (Signed)
eLink Physician-Brief Progress Note Patient Name: Elizabeth [name] DOB: 1971/05/08 MRN: [id number]   Date of Service  08/21/2019  HPI/Events of Note  Pt needs chloraseptic throat spray order.  eICU Interventions  Chloraseptic spray ordered.        Frederik Pear 08/21/2019, 8:47 PM

## 2019-08-21 NOTE — Progress Notes (Addendum)
NAME:  Elizabeth [name], MRN:  [id number], DOB:  1971/04/16, LOS: 6 ADMISSION DATE:  08/15/2019, CONSULTATION DATE:  08/15/19 REFERRING MD: [facility] ED, CHIEF COMPLAINT:  Altered mental status  Brief History   49 year old woman with a history of HIV, COPD, seizure d/o, polysubstance abuse, with new AMS.  Daughter called EMS for suspected overdose, found pill bottles half empty after new prescription filled 08/09/19. To [facility] ED. Benzos and opiates pos on urine tox at 1230.  Given Geodon in ER, developed progressive somnolence > intubated.  Tx to [facility].   Past Medical History  HIV (stribild) COPD Depression (trazodone, seroquel) Migraines Seizures (depakote, last seizure 01/02/19) Hx lumpectomy?  Hx of VATS for wedge resection 2015  [facility] Events   3/14 Admit with suspected overose   Consults:  Poison Control   Procedures:  ETT 3/14 >>   Significant Diagnostic Tests:   CT Head 3/14 >> Normal CT appearance of the brain. Extensive soft tissue prominence within the nasal cavity may represent polyps or diffuse nasal mucosal swelling. Chronic right maxillary sinus disease.  ECHO 3/15 >> LVEF 55-60%, normal LV function, no RWMA, RV systolic function normal, moderately elevated PA systolic pressure, trivial MVR  EEG 3/19>>no seizures  Micro Data:  COVID 3/14 >> negative  Influenza A/B 3/14 >> negative  MRSA PCR 3/14 >> negative  Tracheal Aspirate 3/15 >> normal flora  PCP Smear 3/15 >>  BCx2 3/15 >> Neg  Antimicrobials:  Cefepime 3/15 >> 3/15 Flagyl 3/15 >> 3/15  Ceftriaxone 3/15 >>3/20  Interim history/subjective:  EEG neg for seizures. Overnight, too agitated to undergo MRI. However this morning sedated well on Precedex  Objective   Blood pressure (!) 146/93, pulse 97, temperature 98.5 F (36.9 C), temperature source Axillary, resp. rate (!) 31, height 5' (1.524 m), weight 68.3 kg, SpO2 96 %.        Intake/Output Summary (Last 24 hours) at 08/21/2019 0919 Last data  filed at 08/21/2019 0800 Gross per 24 hour  Intake 1242.01 ml  Output 1700 ml  Net -457.99 ml   Filed Weights   08/19/19 0330 08/20/19 0420 08/21/19 0356  Weight: 74.2 kg 69.5 kg 68.3 kg   Physical Exam: General: Chronically ill-appearing, sedated HENT: Elizabeth [name], AT, ETT in place Eyes: EOMI, no scleral icterus Respiratory: Diminished breath sounds bilaterally. No crackles, wheezing or rales Cardiovascular: RRR, -M/R/G, no JVD GI: BS+, soft, nontender Extremities:-Edema,-tenderness Neuro: Sedated  Resolved Hospital Problem list     Assessment & Plan:   Acute Hypoxemic Respiratory Insufficiency - multifactorial in setting AMS, suspected Aspiration Pneumonitis s/p Ceftriaxone x 7d. Extubated 3/17 - Improved following diuresis COPD exacerbation - Full vent support - Will start weaning trials once she returns from MRI - Daily SBT/WUA - Prednisone x 5 days - Scheduled bronchodilators - VAP  Acute Metabolic Encephalopathy  Suspected Overdose - unintentional according to daugher. Suspect in setting of overdose.  No hx of suicidality.  Oxycodone, gabapentin bottles empty after recent refill.  Elevated LFT's (on norco).  Ammonia 45, VPA, salicylate, acetaminophen negative. Has had prolonged duration of encephalopathy without clear improvement.  Need to rule out other potential causes. EEG negative. Consider electrolyte abnormalities including hypernatremia and uremai - Will obtain MRI to rule out PRES as has had episodes of hypertension - We will hold on lumbar puncture given absence of fever or leukocytosis. - Start FWF  Hypertension  - Continue clonidine TID   Acute Liver Failure with hepatocellular injury pattern. Possibly medication related or  due to shock liver (most likely). Improving. - Continue to monitor  AKI secondary to rhabdomyolysis. Improving CK normalized - Monitor Cr/UOP  Hx Seizures  EEG negative. -continue keppra BID  -seizure precautions   Demand related  myocardial injury.  Trop 2703 --> 2241.  EKG without ST changes  - Telemetry  HIV - Consider restarting home meds with improving LFTs - Will discuss with ID  Best practice:  Diet: TF Pain/Anxiety/Delirium protocol (if indicated): PAD protocol  VAP protocol (if indicated): yes DVT prophylaxis: SCD's  GI prophylaxis: PPI  Glucose control: SSI Mobility: BR Code Status: Full Code Family Communication: Will update family Disposition: ICU  Labs   CBC: Recent Labs  Lab 08/15/19 1053 08/15/19 1053 08/16/19 0126 08/16/19 0320 08/17/19 0913 08/18/19 0313 08/19/19 0933 08/20/19 0007 08/21/19 0057  WBC 12.0*  --  8.5  --  4.9 5.9 10.9*  --   --   NEUTROABS 9.8*  --   --   --   --   --   --   --   --   HGB 11.2*   < > 11.1*   < > 9.9* 9.6* 10.5* 10.2* 10.5*  HCT 36.0   < > 35.6*   < > 31.9* 30.9* 33.5* 30.0* 31.0*  MCV 87.0  --  86.4  --  87.4 87.0 86.8  --   --   PLT 175  --  149*  --  218 262 394  --   --    < > = values in this interval not displayed.    Basic Metabolic Panel: Recent Labs  Lab 08/16/19 1107 08/16/19 1714 08/17/19 0307 08/17/19 0913 08/17/19 0913 08/17/19 1624 08/18/19 0313 08/19/19 0933 08/19/19 1700 08/20/19 0007 08/20/19 0628 08/20/19 1831 08/21/19 0057 08/21/19 0316  NA 145  --   --  147*   < >  --  151* 153*  --  154*  --   --  161* 158*  K 4.3  --   --  4.3   < >  --  4.3 4.6  --  3.7  --   --  3.2* 3.5  CL 110  --   --  115*  --   --  122* 124*  --   --   --   --   --  128*  CO2 18*  --   --  20*  --   --  17* 17*  --   --   --   --   --  20*  GLUCOSE 96  --   --  145*  --   --  170* 171*  --   --   --   --   --  146*  BUN 54*  --   --  47*  --   --  48* 60*  --   --   --   --   --  66*  CREATININE 3.36*  --   --  2.78*  --   --  2.07* 1.38*  --   --   --   --   --  1.20*  CALCIUM 8.0*  --   --  8.2*  --   --  8.5* 9.1  --   --   --   --   --  8.6*  MG 2.3   < >   < >  --   --  2.1  --  2.1 2.2  --  2.2 2.3  --   --  PHOS 4.2   < >   <  >  --   --  4.0  --  4.0 3.9  --  3.7 3.3  --   --    < > = values in this interval not displayed.   GFR: Estimated Creatinine Clearance: 50 mL/min (A) (by C-G formula based on SCr of 1.2 mg/dL (H)). Recent Labs  Lab 08/15/19 1053 08/15/19 1053 08/15/19 2336 08/16/19 0126 08/16/19 0208 08/17/19 0913 08/18/19 0313 08/19/19 0933  PROCALCITON  --   --  7.01  --   --   --   --   --   WBC 12.0*   < >  --  8.5  --  4.9 5.9 10.9*  LATICACIDVEN 2.0*  --   --   --  1.6  --   --   --    < > = values in this interval not displayed.    Liver Function Tests: Recent Labs  Lab 08/16/19 1107 08/17/19 0913 08/18/19 0313 08/19/19 0933 08/21/19 0316  AST 1,660* 650* 220* 79* 26  ALT 1,807* 1,327* 1,026* 697* 289*  ALKPHOS 124 125 119 117 94  BILITOT 1.4* 0.9 1.0 1.1 0.8  PROT 4.6* 4.5* 4.8* 5.6* 5.0*  ALBUMIN 2.3* 2.3* 2.4* 2.8* 2.6*   No results for input(s): LIPASE, AMYLASE in the last 168 hours. Recent Labs  Lab 08/15/19 1227  AMMONIA 45*    ABG    Component Value Date/Time   PHART 7.472 (H) 08/21/2019 0057   PCO2ART 32.1 08/21/2019 0057   PO2ART 71.0 (L) 08/21/2019 0057   HCO3 23.5 08/21/2019 0057   TCO2 24 08/21/2019 0057   ACIDBASEDEF 2.0 08/20/2019 0007   O2SAT 95.0 08/21/2019 0057     Coagulation Profile: Recent Labs  Lab 08/15/19 1227 08/16/19 1107 08/17/19 0913  INR 1.6* 1.6* 1.5*    Cardiac Enzymes: Recent Labs  Lab 08/16/19 0126 08/17/19 0913 08/18/19 0313 08/18/19 1651 08/19/19 0653 08/19/19 1700 08/20/19 0628  CKTOTAL 3,036*   < > 274* 265* 89 102 52  CKMB 31.7*  --   --   --   --   --   --    < > = values in this interval not displayed.    HbA1C: Hgb A1c MFr Bld  Date/Time Value Ref Range Status  08/16/2019 01:26 AM 5.8 (H) 4.8 - 5.6 % Final    Comment:    (NOTE) Pre diabetes:          5.7%-6.4% Diabetes:              >6.4% Glycemic control for   <7.0% adults with diabetes     CBG: Recent Labs  Lab 08/20/19 1529 08/20/19 2039  08/21/19 0012 08/21/19 0353 08/21/19 0722  GLUCAP 137* 146* 153* 135* 155*   The patient is critically ill with multiple organ systems failure and requires high complexity decision making for assessment and support, frequent evaluation and titration of therapies, application of advanced monitoring technologies and extensive interpretation of multiple databases.   Critical Care Time devoted to patient care services described in this note is 36 Minutes.   Rodman Pickle, M.D. [facility] Pulmonary/Critical Care Medicine 08/21/2019 9:19 AM   Please see Amion for pager number to reach on-call Pulmonary and Critical Care Team.

## 2019-08-21 NOTE — Progress Notes (Signed)
eLink Physician-Brief Progress Note Patient Name: Elizabeth [name] DOB: 12/29/1970 MRN: [id number]   Date of Service  08/21/2019  HPI/Events of Note  Patient with tachypnea, ABG within acceptable limits.  eICU Interventions  Portable CXR ordered to exclude aspiration.        Kerry Kass Alixandria Friedt 08/21/2019, 1:04 AM

## 2019-08-21 NOTE — Progress Notes (Signed)
SLP Cancellation Note  Patient Details Name: Elizabeth [name] MRN: [id number] DOB: [birth year]   Cancelled treatment:        Pt sedated, on Precedex. Has Cortrak. Will continue to check - plan 3/22 schedule allowing    [place] Siren 08/21/2019, 11:06 AM [phone]

## 2019-08-22 ENCOUNTER — Inpatient Hospital Stay (HOSPITAL_COMMUNITY): Payer: Medicaid Other

## 2019-08-22 DIAGNOSIS — E870 Hyperosmolality and hypernatremia: Secondary | ICD-10-CM

## 2019-08-22 LAB — CBC
HCT: 32.5 % — ABNORMAL LOW (ref 36.0–46.0)
Hemoglobin: 9.8 g/dL — ABNORMAL LOW (ref 12.0–15.0)
MCH: 27.2 pg (ref 26.0–34.0)
MCHC: 30.2 g/dL (ref 30.0–36.0)
MCV: 90.3 fL (ref 80.0–100.0)
Platelets: 474 10*3/uL — ABNORMAL HIGH (ref 150–400)
RBC: 3.6 MIL/uL — ABNORMAL LOW (ref 3.87–5.11)
RDW: 20.8 % — ABNORMAL HIGH (ref 11.5–15.5)
WBC: 7.2 10*3/uL (ref 4.0–10.5)
nRBC: 0 % (ref 0.0–0.2)

## 2019-08-22 LAB — BASIC METABOLIC PANEL
Anion gap: 4 — ABNORMAL LOW (ref 5–15)
Anion gap: 9 (ref 5–15)
BUN: 49 mg/dL — ABNORMAL HIGH (ref 6–20)
BUN: 42 mg/dL — ABNORMAL HIGH (ref 6–20)
CO2: 25 mmol/L (ref 22–32)
CO2: 23 mmol/L (ref 22–32)
Calcium: 8.7 mg/dL — ABNORMAL LOW (ref 8.9–10.3)
Calcium: 8.9 mg/dL (ref 8.9–10.3)
Chloride: 130 mmol/L — ABNORMAL HIGH (ref 98–111)
Chloride: 121 mmol/L — ABNORMAL HIGH (ref 98–111)
Creatinine, Ser: 0.99 mg/dL (ref 0.44–1.00)
Creatinine, Ser: 1.07 mg/dL — ABNORMAL HIGH (ref 0.44–1.00)
GFR calc Af Amer: >60 mL/min (ref >60)
GFR calc Af Amer: >60 mL/min (ref >60)
GFR calc non Af Amer: >60 mL/min (ref >60)
GFR calc non Af Amer: >60 mL/min (ref >60)
Glucose, Bld: 160 mg/dL — ABNORMAL HIGH (ref 70–99)
Glucose, Bld: 277 mg/dL — ABNORMAL HIGH (ref 70–99)
Potassium: 4.1 mmol/L (ref 3.5–5.1)
Potassium: 4 mmol/L (ref 3.5–5.1)
Sodium: 159 mmol/L — ABNORMAL HIGH (ref 135–145)
Sodium: 153 mmol/L — ABNORMAL HIGH (ref 135–145)

## 2019-08-22 LAB — COMPREHENSIVE METABOLIC PANEL
ALT: 199 U/L — ABNORMAL HIGH (ref 0–44)
AST: 19 U/L (ref 15–41)
Albumin: 2.5 g/dL — ABNORMAL LOW (ref 3.5–5.0)
Alkaline Phosphatase: 91 U/L (ref 38–126)
BUN: 53 mg/dL — ABNORMAL HIGH (ref 6–20)
CO2: 24 mmol/L (ref 22–32)
Calcium: 8.8 mg/dL — ABNORMAL LOW (ref 8.9–10.3)
Chloride: >130 mmol/L (ref 98–111)
Creatinine, Ser: 1 mg/dL (ref 0.44–1.00)
GFR calc Af Amer: >60 mL/min (ref >60)
GFR calc non Af Amer: >60 mL/min (ref >60)
Glucose, Bld: 178 mg/dL — ABNORMAL HIGH (ref 70–99)
Potassium: 3.8 mmol/L (ref 3.5–5.1)
Sodium: 162 mmol/L (ref 135–145)
Total Bilirubin: 0.7 mg/dL (ref 0.3–1.2)
Total Protein: 5 g/dL — ABNORMAL LOW (ref 6.5–8.1)

## 2019-08-22 LAB — GLUCOSE, CAPILLARY
Glucose-Capillary: 105 mg/dL — ABNORMAL HIGH (ref 70–99)
Glucose-Capillary: 134 mg/dL — ABNORMAL HIGH (ref 70–99)
Glucose-Capillary: 140 mg/dL — ABNORMAL HIGH (ref 70–99)
Glucose-Capillary: 171 mg/dL — ABNORMAL HIGH (ref 70–99)
Glucose-Capillary: 183 mg/dL — ABNORMAL HIGH (ref 70–99)
Glucose-Capillary: 262 mg/dL — ABNORMAL HIGH (ref 70–99)

## 2019-08-22 MED ORDER — DEXTROSE 5 % IV SOLN: INTRAVENOUS | Status: AC

## 2019-08-22 MED ORDER — FREE WATER
300.0000 mL | Status: DC
  Administered 2019-08-22 – 2019-08-24 (×25): 300 mL

## 2019-08-22 MED ORDER — IPRATROPIUM-ALBUTEROL 0.5-2.5 (3) MG/3ML IN SOLN
3.0000 mL | Freq: Three times a day (TID) | RESPIRATORY_TRACT | Status: DC
  Administered 2019-08-22 – 2019-08-23 (×4): 3 mL via RESPIRATORY_TRACT
  Filled 2019-08-22 (×4): qty 3

## 2019-08-22 MED ORDER — SENNOSIDES 8.8 MG/5ML PO SYRP
5.0000 mL | ORAL_SOLUTION | Freq: Two times a day (BID) | ORAL | Status: DC
  Administered 2019-08-22: 5 mL
  Filled 2019-08-22: qty 5

## 2019-08-22 MED ORDER — POLYETHYLENE GLYCOL 3350 17 G PO PACK
17.0000 g | PACK | Freq: Every day | ORAL | Status: DC
  Administered 2019-08-22: 17 g
  Filled 2019-08-22: qty 1

## 2019-08-22 NOTE — Progress Notes (Signed)
NAME:  Elizabeth [name], MRN:  [id number], DOB:  [birth year], LOS: 7 ADMISSION DATE:  08/15/2019, CONSULTATION DATE:  08/15/19 REFERRING MD: [facility], CHIEF COMPLAINT:  Altered mental status  Brief History   49 year old woman with a history of HIV, COPD, seizure d/o, polysubstance abuse, with new AMS.  Daughter called EMS for suspected overdose, found pill bottles half empty after new prescription filled 08/09/19. To [facility] ED. Benzos and opiates pos on urine tox at 1230.  Given Geodon in ER, developed progressive somnolence > intubated.  Tx to [facility].   Past Medical History  HIV (stribild) COPD Depression (trazodone, seroquel) Migraines Seizures (depakote, last seizure 01/02/19) Hx lumpectomy?  Hx of VATS for wedge resection 2015  [facility] Events   3/14 Admit with suspected overose   Consults:  Poison Control   Procedures:  ETT 3/14 >>   Significant Diagnostic Tests:   CT Head 3/14 >> Normal CT appearance of the brain. Extensive soft tissue prominence within the nasal cavity may represent polyps or diffuse nasal mucosal swelling. Chronic right maxillary sinus disease.  ECHO 3/15 >> LVEF 55-60%, normal LV function, no RWMA, RV systolic function normal, moderately elevated PA systolic pressure, trivial MVR  EEG 3/19>>no seizures  Micro Data:  COVID 3/14 >> negative  Influenza A/B 3/14 >> negative  MRSA PCR 3/14 >> negative  Tracheal Aspirate 3/15 >> normal flora  PCP Smear 3/15 >>  BCx2 3/15 >> Neg  Antimicrobials:  Cefepime 3/15 >> 3/15 Flagyl 3/15 >> 3/15  Ceftriaxone 3/15 >>3/20  Interim history/subjective:  Awake this morning. Confused.   Objective   Blood pressure 120/74, pulse 95, temperature 98.8 F (37.1 C), temperature source Oral, resp. rate (!) 21, height 5' (1.524 m), weight 68.5 kg, SpO2 96 %.        Intake/Output Summary (Last 24 hours) at 08/22/2019 0840 Last data filed at 08/22/2019 0800 Gross per 24 hour  Intake 4013.5 ml  Output 1425 ml  Net  2588.5 ml   Filed Weights   08/20/19 0420 08/21/19 0356 08/22/19 0135  Weight: 69.5 kg 68.3 kg 68.5 kg    Physical Exam: General: Chronically ill-appearing, no acute distress HENT: [place], AT, OP clear, MMM Eyes: EOMI, no scleral icterus Respiratory: Anterior wheezes present Cardiovascular: RRR, -M/R/G, no JVD GI: BS+, soft, nontender Extremities:-Edema,-tenderness Neuro: Awake. Not oriented. Does not follow commands. Spontaneously moves extremities x 4  Resolved Hospital Problem list     Assessment & Plan:   Acute Hypoxemic Respiratory Insufficiency - multifactorial in setting AMS, suspected Aspiration Pneumonitis s/p Ceftriaxone x 7d. Extubated 3/17 - Improved following diuresis COPD exacerbation - Prednisone x 5 days - Scheduled bronchodilators - VAP  Acute Metabolic Encephalopathy  - improving Suspected Overdose - unintentional according to daugher. Suspect in setting of overdose.  No hx of suicidality.  Oxycodone, gabapentin bottles empty after recent refill.  Elevated LFT's (on norco).  Ammonia 45, VPA, salicylate, acetaminophen negative. Has had prolonged duration of encephalopathy without clear improvement.  Need to rule out other potential causes. EEG negative. Consider electrolyte abnormalities including hypernatremia and uremia. MRI with punctate area concerning for hypoxic/anoxic injury but would not expect this to contribute significantly. - Continue FWF  Hypernatremia Worsening. Reviewed medications with pharmacy. Only precedex with Na however this seems out of proportion to what would be causing her elevated sodium level - Repeat BMP stat to rule out lab error - Increased FWF  Hypertension  - Continue clonidine TID   Acute Liver Failure with  hepatocellular injury pattern. Possibly medication related or due to shock liver (most likely). Improving. - Continue to monitor  AKI secondary to rhabdomyolysis. Resolving CK normalized - Monitor Cr/UOP  Hx Seizures   EEG negative. -continue keppra BID  -seizure precautions   Demand related myocardial injury.  Trop 2703 --> 2241.  EKG without ST changes  - Telemetry  HIV - Restarted home meds  Best practice:  Diet: TF Pain/Anxiety/Delirium protocol (if indicated): PAD protocol  VAP protocol (if indicated): No DVT prophylaxis: SCD's  GI prophylaxis: PPI  Glucose control: SSI Mobility: BR Code Status: Full Code Family Communication: Family updated 3/20 Disposition: ICU  Labs   CBC: Recent Labs  Lab 08/15/19 1053 08/15/19 1053 08/16/19 0126 08/16/19 0320 08/17/19 0913 08/17/19 0913 08/18/19 9563 08/19/19 0933 08/20/19 0007 08/21/19 0057 08/22/19 0121  WBC 12.0*   < > 8.5  --  4.9  --  5.9 10.9*  --   --  7.2  NEUTROABS 9.8*  --   --   --   --   --   --   --   --   --   --   HGB 11.2*   < > 11.1*   < > 9.9*   < > 9.6* 10.5* 10.2* 10.5* 9.8*  HCT 36.0   < > 35.6*   < > 31.9*   < > 30.9* 33.5* 30.0* 31.0* 32.5*  MCV 87.0   < > 86.4  --  87.4  --  87.0 86.8  --   --  90.3  PLT 175   < > 149*  --  218  --  262 394  --   --  474*   < > = values in this interval not displayed.    Basic Metabolic Panel: Recent Labs  Lab 08/17/19 0307 08/17/19 0913 08/17/19 0913 08/17/19 1624 08/18/19 0313 08/18/19 0313 08/19/19 0933 08/19/19 1700 08/20/19 0007 08/20/19 8756 08/20/19 1831 08/21/19 0057 08/21/19 0316 08/22/19 0121  NA  --  147*   < >  --  151*   < > 153*  --  154*  --   --  161* 158* 162*  K  --  4.3   < >  --  4.3   < > 4.6  --  3.7  --   --  3.2* 3.5 3.8  CL  --  115*  --   --  122*  --  124*  --   --   --   --   --  128* >130*  CO2  --  20*  --   --  17*  --  17*  --   --   --   --   --  20* 24  GLUCOSE  --  145*  --   --  170*  --  171*  --   --   --   --   --  146* 178*  BUN  --  47*  --   --  48*  --  60*  --   --   --   --   --  66* 53*  CREATININE  --  2.78*  --   --  2.07*  --  1.38*  --   --   --   --   --  1.20* 1.00  CALCIUM  --  8.2*  --   --  8.5*  --  9.1   --   --   --   --   --  8.6* 8.8*  MG   < >  --   --  2.1  --   --  2.1 2.2  --  2.2 2.3  --   --   --   PHOS   < >  --   --  4.0  --   --  4.0 3.9  --  3.7 3.3  --   --   --    < > = values in this interval not displayed.   GFR: Estimated Creatinine Clearance: 60.1 mL/min (by C-G formula based on SCr of 1 mg/dL). Recent Labs  Lab 08/15/19 1053 08/15/19 2336 08/16/19 0126 08/16/19 0208 08/17/19 0913 08/18/19 0313 08/19/19 0933 08/22/19 0121  PROCALCITON  --  7.01  --   --   --   --   --   --   WBC 12.0*  --    < >  --  4.9 5.9 10.9* 7.2  LATICACIDVEN 2.0*  --   --  1.6  --   --   --   --    < > = values in this interval not displayed.    Liver Function Tests: Recent Labs  Lab 08/17/19 0913 08/18/19 0313 08/19/19 0933 08/21/19 0316 08/22/19 0121  AST 650* 220* 79* 26 19  ALT 1,327* 1,026* 697* 289* 199*  ALKPHOS 125 119 117 94 91  BILITOT 0.9 1.0 1.1 0.8 0.7  PROT 4.5* 4.8* 5.6* 5.0* 5.0*  ALBUMIN 2.3* 2.4* 2.8* 2.6* 2.5*   No results for input(s): LIPASE, AMYLASE in the last 168 hours. Recent Labs  Lab 08/15/19 1227  AMMONIA 45*    ABG    Component Value Date/Time   PHART 7.472 (H) 08/21/2019 0057   PCO2ART 32.1 08/21/2019 0057   PO2ART 71.0 (L) 08/21/2019 0057   HCO3 23.5 08/21/2019 0057   TCO2 24 08/21/2019 0057   ACIDBASEDEF 2.0 08/20/2019 0007   O2SAT 95.0 08/21/2019 0057     Coagulation Profile: Recent Labs  Lab 08/15/19 1227 08/16/19 1107 08/17/19 0913  INR 1.6* 1.6* 1.5*    Cardiac Enzymes: Recent Labs  Lab 08/16/19 0126 08/17/19 0913 08/18/19 0313 08/18/19 1651 08/19/19 0653 08/19/19 1700 08/20/19 0628  CKTOTAL 3,036*   < > 274* 265* 89 102 52  CKMB 31.7*  --   --   --   --   --   --    < > = values in this interval not displayed.    HbA1C: Hgb A1c MFr Bld  Date/Time Value Ref Range Status  08/16/2019 01:26 AM 5.8 (H) 4.8 - 5.6 % Final    Comment:    (NOTE) Pre diabetes:          5.7%-6.4% Diabetes:               >6.4% Glycemic control for   <7.0% adults with diabetes     CBG: Recent Labs  Lab 08/21/19 1519 08/21/19 1940 08/21/19 2334 08/22/19 0346 08/22/19 0725  GLUCAP 210* 160* 183* 183* 134*    The patient is critically ill with multiple organ systems failure and requires high complexity decision making for assessment and support, frequent evaluation and titration of therapies, application of advanced monitoring technologies and extensive interpretation of multiple databases.   Critical Care Time devoted to patient care services described in this note is 32 Minutes.   Rodman Pickle, M.D. [facility] Pulmonary/Critical Care Medicine 08/22/2019 8:40 AM   Please see Amion for pager number to reach on-call Pulmonary and Critical Care  Team.

## 2019-08-22 NOTE — Progress Notes (Signed)
[place] [facility] Progress Note Patient Name: Elizabeth [name] DOB: 1971/03/18 MRN: [id number]   Date of Service  08/22/2019  HPI/Events of Note  Serum Na+ 160, Chloride 130  eICU Interventions  Free water frequency changed to Q 2 hours.        Kerry Kass Lateefa Crosby 08/22/2019, 2:41 AM

## 2019-08-22 NOTE — Progress Notes (Signed)
CRITICAL VALUE ALERT  Critical Value:  Na 162 Cl >130  Date & Time Notied:  08/22/19 @0233   Provider Notified: Loraine Maple RN to notify MD  Orders Received/Actions taken: Free Water starting at 245 300 ml every 2 hrs

## 2019-08-22 NOTE — Progress Notes (Signed)
Attempted to page speech therapy 3 times. Pt much more appropriate today for swallow eval and requesting water and food. Have not heard back from SLP yet.

## 2019-08-22 NOTE — Progress Notes (Signed)
   08/22/19 1257  Clinical Encounter Type  Visited With Patient;Health care provider  Visit Type Initial;Spiritual support;Critical Care  Referral From Patient;Nurse  [place] responded to a request from the patient for someone to pray with. Chaplain met with patient, but struggled to understand her. Patient asked for pen and paper, which chaplain helped to provide. However, patient struggled to communicate with this too.   Chaplain offered prayer and support. Chaplain introduced spiritual care services. Spiritual care services available as needed.   Jeri Lager, Chaplain

## 2019-08-23 ENCOUNTER — Inpatient Hospital Stay (HOSPITAL_COMMUNITY): Payer: Medicaid Other

## 2019-08-23 LAB — COMPREHENSIVE METABOLIC PANEL
ALT: 155 U/L — ABNORMAL HIGH (ref 0–44)
AST: 28 U/L (ref 15–41)
Albumin: 2.7 g/dL — ABNORMAL LOW (ref 3.5–5.0)
Alkaline Phosphatase: 89 U/L (ref 38–126)
Anion gap: 11 (ref 5–15)
BUN: 34 mg/dL — ABNORMAL HIGH (ref 6–20)
CO2: 23 mmol/L (ref 22–32)
Calcium: 8.8 mg/dL — ABNORMAL LOW (ref 8.9–10.3)
Chloride: 120 mmol/L — ABNORMAL HIGH (ref 98–111)
Creatinine, Ser: 0.91 mg/dL (ref 0.44–1.00)
GFR calc Af Amer: >60 mL/min (ref >60)
GFR calc non Af Amer: >60 mL/min (ref >60)
Glucose, Bld: 150 mg/dL — ABNORMAL HIGH (ref 70–99)
Potassium: 3.1 mmol/L — ABNORMAL LOW (ref 3.5–5.1)
Sodium: 154 mmol/L — ABNORMAL HIGH (ref 135–145)
Total Bilirubin: 0.6 mg/dL (ref 0.3–1.2)
Total Protein: 5.4 g/dL — ABNORMAL LOW (ref 6.5–8.1)

## 2019-08-23 LAB — CBC
HCT: 34.1 % — ABNORMAL LOW (ref 36.0–46.0)
Hemoglobin: 10.2 g/dL — ABNORMAL LOW (ref 12.0–15.0)
MCH: 27.2 pg (ref 26.0–34.0)
MCHC: 29.9 g/dL — ABNORMAL LOW (ref 30.0–36.0)
MCV: 90.9 fL (ref 80.0–100.0)
Platelets: 484 10*3/uL — ABNORMAL HIGH (ref 150–400)
RBC: 3.75 MIL/uL — ABNORMAL LOW (ref 3.87–5.11)
RDW: 20.2 % — ABNORMAL HIGH (ref 11.5–15.5)
WBC: 12.4 10*3/uL — ABNORMAL HIGH (ref 4.0–10.5)
nRBC: 0.3 % — ABNORMAL HIGH (ref 0.0–0.2)

## 2019-08-23 LAB — GLUCOSE, CAPILLARY
Glucose-Capillary: 131 mg/dL — ABNORMAL HIGH (ref 70–99)
Glucose-Capillary: 159 mg/dL — ABNORMAL HIGH (ref 70–99)
Glucose-Capillary: 170 mg/dL — ABNORMAL HIGH (ref 70–99)
Glucose-Capillary: 210 mg/dL — ABNORMAL HIGH (ref 70–99)
Glucose-Capillary: 124 mg/dL — ABNORMAL HIGH (ref 70–99)
Glucose-Capillary: 120 mg/dL — ABNORMAL HIGH (ref 70–99)

## 2019-08-23 LAB — SODIUM
Sodium: 149 mmol/L — ABNORMAL HIGH (ref 135–145)
Sodium: 148 mmol/L — ABNORMAL HIGH (ref 135–145)

## 2019-08-23 MED ORDER — UMECLIDINIUM BROMIDE 62.5 MCG/INH IN AEPB
1.0000 | INHALATION_SPRAY | Freq: Every day | RESPIRATORY_TRACT | Status: DC
  Administered 2019-08-23 – 2019-08-26 (×4): 1 via RESPIRATORY_TRACT
  Filled 2019-08-23: qty 7

## 2019-08-23 MED ORDER — OXYCODONE-ACETAMINOPHEN 5-325 MG PO TABS
1.0000 | ORAL_TABLET | ORAL | Status: DC | PRN
  Administered 2019-08-24 – 2019-08-26 (×6): 1 via ORAL
  Filled 2019-08-23 (×7): qty 1

## 2019-08-23 MED ORDER — CLONIDINE HCL 0.2 MG PO TABS
0.3000 mg | ORAL_TABLET | Freq: Three times a day (TID) | ORAL | Status: DC
  Administered 2019-08-23 – 2019-08-26 (×10): 0.3 mg
  Filled 2019-08-23 (×10): qty 1

## 2019-08-23 MED ORDER — MOMETASONE FURO-FORMOTEROL FUM 200-5 MCG/ACT IN AERO
2.0000 | INHALATION_SPRAY | Freq: Two times a day (BID) | RESPIRATORY_TRACT | Status: DC
  Administered 2019-08-23 – 2019-08-26 (×7): 2 via RESPIRATORY_TRACT
  Filled 2019-08-23: qty 8.8

## 2019-08-23 MED ORDER — FUROSEMIDE 10 MG/ML IJ SOLN
20.0000 mg | Freq: Once | INTRAMUSCULAR | Status: AC
  Administered 2019-08-23: 20 mg via INTRAVENOUS
  Filled 2019-08-23: qty 2

## 2019-08-23 MED ORDER — POTASSIUM CHLORIDE 20 MEQ/15ML (10%) PO SOLN
30.0000 meq | ORAL | Status: AC
  Administered 2019-08-23 (×2): 30 meq
  Filled 2019-08-23 (×2): qty 30

## 2019-08-23 MED ORDER — IPRATROPIUM-ALBUTEROL 0.5-2.5 (3) MG/3ML IN SOLN
3.0000 mL | RESPIRATORY_TRACT | Status: DC | PRN
  Filled 2019-08-23 (×2): qty 3

## 2019-08-23 NOTE — Progress Notes (Addendum)
  Speech Language Pathology Treatment: Dysphagia  Patient Details Name: Elizabeth [name] MRN: [id number] DOB: 1971/01/25 Today's Date: 08/23/2019 Time: 1001-1016 SLP Time Calculation (min) (ACUTE ONLY): 15 min  Assessment / Plan / Recommendation Clinical Impression  Pt' alertness and cognition much improved this morning; sitting in chair and conversive. Congestion at baseline; moderately strong cough. Concerned that pt is not fully protecting her airway primarily with liquids as immediate cough and delayed throat clear after water trials present. Given deconditioned state, intubation and clinical observations during session an MBS is warranted prior to po recommendation. Scheduled today at 1430.  Radiology paged this SLP to see if MBS time could be moved up. MBS now 1330. Xray technician to call RN to notify of time change.    HPI HPI: 49 year old woman with a history of HIV, COPD, seizure d/o, polysubstance abuse, with new AMS. Daughter called EMS for suspected overdose, found pill bottles half empty after new prescription filled 3/8/2Benzos and opiates pos on urine tox. Intubated 3/14-3/17. Acute Hypoxemic Respiratory Insufficiency - multifactorial in setting AMS, suspected Aspiration Pneumonitis with associated Bronchospasm; acute metabloic encephalopathy, AKI secondary to rhabdomyolosis, acute liver failure.       SLP Plan  MBS       Recommendations  Diet recommendations: NPO Medication Administration: Via alternative means                Oral Care Recommendations: Oral care QID Follow up Recommendations: 24 hour supervision/assistance SLP Visit Diagnosis: Dysphagia, unspecified (R13.10) Plan: MBS                       Houston Siren 08/23/2019, 10:23 AM   Orbie Pyo Colvin Caroli.Ed Risk analyst [phone] Office [phone]

## 2019-08-23 NOTE — Progress Notes (Signed)
Pt SOB, lungs diminished with expiratory wheezes noted, RR 24 with sats 95% on 2L [place].  RT called and treatment requested.

## 2019-08-23 NOTE — Evaluation (Addendum)
Occupational Therapy Evaluation Patient Details Name: Elizabeth [name] MRN: [id number] DOB: 1970-09-26 Today's Date: 08/23/2019    History of Present Illness 49 year old woman with a history of HIV, COPD, seizure d/o, polysubstance abuse, with new AMS.  Admitted with [facility] 08/17/19 after daughter found pill bottles half empty after new prescription filled 08/09/19. Intubated 3/16 and transferred to ICU   Clinical Impression   Pt was independent prior to admission. Presents with significant, generalized weakness and decreased balance. She requires mod to total assist for ADL and +2 assist for OOB mobility. Pt will need post acute rehab prior to return home. Will follow acutely.    Follow Up Recommendations  SNF;Supervision/Assistance - 24 hour    Equipment Recommendations  (defer to next venue)    Recommendations for Other Services       Precautions / Restrictions Precautions Precautions: Fall      Mobility Bed Mobility               General bed mobility comments: Pt received on BSC, left in chair.  Transfers Overall transfer level: Needs assistance   Transfers: Sit to/from Stand Sit to Stand: +2 physical assistance;Min assist         General transfer comment: pulled up from [facility] to rail on bed, brought chair up behind and pt remained up in chair    Balance Overall balance assessment: Needs assistance   Sitting balance-Leahy Scale: Fair     Standing balance support: Bilateral upper extremity supported Standing balance-Leahy Scale: Poor                             ADL either performed or assessed with clinical judgement   ADL Overall ADL's : Needs assistance/impaired Eating/Feeding: NPO   Grooming: Moderate assistance;Sitting   Upper Body Bathing: Sitting;Maximal assistance   Lower Body Bathing: Total assistance;+2 for physical assistance;Sit to/from stand   Upper Body Dressing : Sitting;Maximal assistance   Lower Body Dressing: Total  assistance;+2 for physical assistance;Sit to/from stand   Toilet Transfer: +2 for physical assistance;Minimal assistance;BSC   Toileting- Clothing Manipulation and Hygiene: Total assistance;+2 for physical assistance;Sit to/from stand         General ADL Comments: Pt received on BSC.     Vision Patient Visual Report: No change from baseline       Perception     Praxis      Pertinent Vitals/Pain Pain Assessment: No/denies pain Faces Pain Scale: Hurts a little bit Pain Intervention(s): Monitored during session     Hand Dominance Right   Extremity/Trunk Assessment Upper Extremity Assessment Upper Extremity Assessment: Generalized weakness   Lower Extremity Assessment Lower Extremity Assessment: Defer to PT evaluation   Cervical / Trunk Assessment Cervical / Trunk Assessment: Other exceptions(flexed posture, weakness)   Communication Communication Communication: Expressive difficulties(low volume)   Cognition Arousal/Alertness: Awake/alert Behavior During Therapy: Flat affect Overall Cognitive Status: Impaired/Different from baseline Area of Impairment: Memory;Following commands;Orientation;Problem solving;Safety/judgement;Awareness;Attention                 Orientation Level: Disoriented to;Time Current Attention Level: Sustained Memory: Decreased short-term memory Following Commands: Follows one step commands with increased time Safety/Judgement: Decreased awareness of safety;Decreased awareness of deficits Awareness: Intellectual Problem Solving: Slow processing;Difficulty sequencing;Decreased initiation;Requires verbal cues;Requires tactile cues     General Comments       Exercises     Shoulder Instructions      Home Living Family/patient expects to be discharged  to:: Private residence Living Arrangements: Spouse/significant other Available Help at Discharge: Family                                    Prior  Functioning/Environment Level of Independence: Independent                 OT Problem List: Decreased strength;Decreased activity tolerance;Impaired balance (sitting and/or standing);Decreased knowledge of use of DME or AE;Cardiopulmonary status limiting activity;Decreased cognition;Decreased safety awareness;Obesity      OT Treatment/Interventions: Self-care/ADL training;DME and/or AE instruction;Energy conservation;Patient/family education;Balance training;Therapeutic activities;Cognitive remediation/compensation    OT Goals(Current goals can be found in the care plan section) Acute Rehab OT Goals Patient Stated Goal: breathe better OT Goal Formulation: With patient Time For Goal Achievement: 09/06/19 Potential to Achieve Goals: Good ADL Goals Pt Will Perform Eating: with set-up;sitting Pt Will Perform Grooming: with set-up;sitting Pt Will Perform Upper Body Dressing: with min assist;sitting Pt Will Perform Lower Body Dressing: with mod assist;sit to/from stand Pt Will Transfer to Toilet: with min assist;bedside commode;stand pivot transfer Pt Will Perform Toileting - Clothing Manipulation and hygiene: with min assist;sit to/from stand Additional ADL Goal #1: Pt will be oriented x 4 using environmental cues as needed.  OT Frequency: Min 2X/week   Barriers to D/C:            Co-evaluation PT/OT/SLP Co-Evaluation/Treatment: Yes Reason for Co-Treatment: Necessary to address cognition/behavior during functional activity   OT goals addressed during session: ADL's and self-care      AM-PAC OT "6 Clicks" Daily Activity     Outcome Measure Help from another person eating meals?: Total Help from another person taking care of personal grooming?: Total Help from another person toileting, which includes using toliet, bedpan, or urinal?: Total Help from another person bathing (including washing, rinsing, drying)?: A Lot Help from another person to put on and taking off regular  upper body clothing?: A Lot Help from another person to put on and taking off regular lower body clothing?: Total 6 Click Score: 8   End of Session Equipment Utilized During Treatment: Gait belt  Activity Tolerance: Patient tolerated treatment well Patient left: with call bell/phone within reach;in chair  OT Visit Diagnosis: Muscle weakness (generalized) (M62.81);Unsteadiness on feet (R26.81)                Time: 8882-8003 OT Time Calculation (min): 26 Charges:  OT General Charges $OT Visit: 1 Visit OT Evaluation $OT Eval Moderate Complexity: 1 Mod  Malka So 08/23/2019, 11:34 AM  Nestor Lewandowsky, OTR/L Acute Rehabilitation Services Pager: [phone] Office: [phone]

## 2019-08-23 NOTE — ED Notes (Signed)
Pts daughter, Gerilyn Pilgrim here to picked up Pt's belongings locked up, containing a black bag with some of her meds in it.  Two of her medications had been taken to Pharmacy.

## 2019-08-23 NOTE — Progress Notes (Signed)
[facility] ADULT ICU REPLACEMENT PROTOCOL FOR AM LAB REPLACEMENT ONLY  The patient does apply for the [facility] Adult ICU Electrolyte Replacment Protocol based on the criteria listed below:   1. Is GFR >/= 40 ml/min? Yes.    Patient's GFR today is >60 2. Is urine output >/= 0.5 ml/kg/hr for the last 6 hours? Yes.   Patient's UOP is 1.7 ml/kg/hr 3. Is BUN < 60 mg/dL? Yes.    Patient's BUN today is 34 4. Abnormal electrolyte(s): k 3.1 5. Ordered repletion with: protocol per tube 6. If a panic level lab has been reported, has the CCM MD in charge been notified? No..   Physician:    Ronda Fairly A 08/23/2019 6:47 AM

## 2019-08-23 NOTE — Progress Notes (Signed)
Pt extremely anxious and SOB with minimal activity.  Does not desaturate, however RR 20-30s and HR increased

## 2019-08-23 NOTE — Progress Notes (Signed)
Pt with large watery stools.  Held Miralax and sennakot.  Up to [facility], up to chair with PT/OT.  Classed out @0907 .  Seen by SLP.  Went for [facility] and returned 1412.  S/w dtr, verified PW - informed her of pending transfer; stated she would be here to visit later today.

## 2019-08-23 NOTE — Progress Notes (Signed)
NAME:  Elizabeth [name], MRN:  [id number], DOB:  [birth year], LOS: 8 ADMISSION DATE:  08/15/2019, CONSULTATION DATE:  08/15/19 REFERRING MD: [facility], CHIEF COMPLAINT:  Altered mental status  Brief History   49 year old woman with a history of HIV, COPD, seizure d/o, polysubstance abuse, with new AMS.  Daughter called EMS for suspected overdose, found pill bottles half empty after new prescription filled 08/09/19. To [name] ED. Benzos and opiates pos on urine tox at 1230.  Given Geodon in ER, developed progressive somnolence > intubated.  Tx to [facility].   Past Medical History  HIV (stribild) COPD Depression (trazodone, seroquel) Migraines Seizures (depakote, last seizure 01/02/19) Hx lumpectomy?  Hx of VATS for wedge resection 2015  [place] [facility] Events   3/14 Admit with suspected overose   Consults:  Poison Control   Procedures:  ETT 3/14 >3/17  Significant Diagnostic Tests:   CT Head 3/14 >> Normal CT appearance of the brain. Extensive soft tissue prominence within the nasal cavity may represent polyps or diffuse nasal mucosal swelling. Chronic right maxillary sinus disease.  ECHO 3/15 >> LVEF 55-60%, normal LV function, no RWMA, RV systolic function normal, moderately elevated PA systolic pressure, trivial MVR  EEG 3/19>>no seizures  Micro Data:  COVID 3/14 >> negative  Influenza A/B 3/14 >> negative  MRSA PCR 3/14 >> negative  Tracheal Aspirate 3/15 >> normal flora  PCP Smear 3/15 >>  BCx2 3/15 >> Neg  Antimicrobials:  Cefepime 3/15 >> 3/15 Flagyl 3/15 >> 3/15  Ceftriaxone 3/15 >>3/20  Interim history/subjective:  No events overnight. Off precedex since yesterday. Oriented x 3  Objective   Blood pressure (!) 169/97, pulse (!) 111, temperature 98.8 F (37.1 C), temperature source Oral, resp. rate (!) 25, height 5' (1.524 m), weight 71.3 kg, SpO2 96 %.        Intake/Output Summary (Last 24 hours) at 08/23/2019 0839 Last data filed at 08/23/2019 0500 Gross per 24  hour  Intake 6073.99 ml  Output 750 ml  Net 5323.99 ml   Filed Weights   08/21/19 0356 08/22/19 0135 08/23/19 0500  Weight: 68.3 kg 68.5 kg 71.3 kg   Physical Exam: General: Chronically ill-appearing, no acute distress HENT: [place], AT, OP clear, MMM Eyes: EOMI, no scleral icterus Respiratory: Clear to auscultation bilaterally.  No crackles, wheezing or rales Cardiovascular: RRR, -M/R/G, no JVD GI: BS+, soft, nontender Extremities:-Edema,-tenderness Neuro: AAO x3, CNII-XII grossly intact  Resolved Hospital Problem list   Transaminitis AKI secondary to rhabdomyolysis Demand ischemia Acute hypoxemic respiratory failure secondary to aspiration pneumonia requiring intubation s/p ceftriaxone x 7 days.  Assessment & Plan:  Acute hypoxemic respiratory failure COPD exacerbation - Wean supplemental oxygen for goal >88% - Prednisone x 5 days - Change nebulizers to [facility] and Incruse while inpatient. Can transition to home Symbicort and Spiriva at discharge - Diuresis  Acute Metabolic Encephalopathy  - improving Suspected Overdose - unintentional according to daugher. Suspect in setting of overdose.  No hx of suicidality.  Oxycodone, gabapentin bottles empty after recent refill.  Elevated LFT's (on norco).  Ammonia 45, VPA, salicylate, acetaminophen negative. Has had prolonged duration of encephalopathy without clear improvement.  Need to rule out other potential causes. EEG negative. Consider electrolyte abnormalities including hypernatremia and uremia. MRI with punctate area concerning for hypoxic/anoxic injury but would not expect this to contribute significantly. - Continue FWF - Swallow evaluation  Hypernatremia - improving Reviewed medications with pharmacy. Only precedex with Na however this seems out of proportion to  what would be causing her elevated sodium level - Continue FWF  Hypertension  - Switch from clonidine patch to PO - Increase clonidine to 0.3 mg TID   Hx Seizures   EEG negative. -continue keppra BID  -seizure precautions   HIV - Restarted home meds  Best practice:  Diet: TF, swallow evaluation pending Pain/Anxiety/Delirium protocol (if indicated): PAD protocol  VAP protocol (if indicated): No DVT prophylaxis: SCD's  GI prophylaxis: PPI  Glucose control: SSI Mobility: BR Code Status: Full Code Family Communication: Family updated 3/22 Disposition: Transfer to floor. TRH to pick up 3/23  Labs   CBC: Recent Labs  Lab 08/17/19 0913 08/17/19 0913 08/18/19 0313 08/18/19 0313 08/19/19 0933 08/20/19 0007 08/21/19 0057 08/22/19 0121 08/23/19 0323  WBC 4.9  --  5.9  --  10.9*  --   --  7.2 12.4*  HGB 9.9*   < > 9.6*   < > 10.5* 10.2* 10.5* 9.8* 10.2*  HCT 31.9*   < > 30.9*   < > 33.5* 30.0* 31.0* 32.5* 34.1*  MCV 87.4  --  87.0  --  86.8  --   --  90.3 90.9  PLT 218  --  262  --  394  --   --  474* 484*   < > = values in this interval not displayed.    Basic Metabolic Panel: Recent Labs  Lab  0000 08/17/19 1624 08/18/19 0313 08/19/19 0933 08/19/19 1700 08/20/19 0007 08/20/19 4098 08/20/19 1831 08/21/19 0057 08/21/19 0316 08/22/19 0121 08/22/19 0901 08/22/19 1616 08/23/19 0323  NA  --   --    < > 153*  --    < >  --   --    < > 158* 162* 159* 153* 154*  K  --   --    < > 4.6  --    < >  --   --    < > 3.5 3.8 4.1 4.0 3.1*  CL   < >  --    < > 124*  --   --   --   --   --  128* >130* 130* 121* 120*  CO2   < >  --    < > 17*  --   --   --   --   --  20* 24 25 23 23   GLUCOSE   < >  --    < > 171*  --   --   --   --   --  146* 178* 160* 277* 150*  BUN   < >  --    < > 60*  --   --   --   --   --  66* 53* 49* 42* 34*  CREATININE   < >  --    < > 1.38*  --   --   --   --   --  1.20* 1.00 0.99 1.07* 0.91  CALCIUM   < >  --    < > 9.1  --   --   --   --   --  8.6* 8.8* 8.7* 8.9 8.8*  MG  --  2.1  --  2.1 2.2  --  2.2 2.3  --   --   --   --   --   --   PHOS  --  4.0  --  4.0 3.9  --  3.7 3.3  --   --   --   --   --   --    < > =  values in this interval not displayed.   GFR: Estimated Creatinine Clearance: 67.3 mL/min (by C-G formula based on SCr of 0.91 mg/dL). Recent Labs  Lab 08/18/19 0313 08/19/19 0933 08/22/19 0121 08/23/19 0323  WBC 5.9 10.9* 7.2 12.4*    Liver Function Tests: Recent Labs  Lab 08/18/19 0313 08/19/19 0933 08/21/19 0316 08/22/19 0121 08/23/19 0323  AST 220* 79* 26 19 28   ALT 1,026* 697* 289* 199* 155*  ALKPHOS 119 117 94 91 89  BILITOT 1.0 1.1 0.8 0.7 0.6  PROT 4.8* 5.6* 5.0* 5.0* 5.4*  ALBUMIN 2.4* 2.8* 2.6* 2.5* 2.7*   No results for input(s): LIPASE, AMYLASE in the last 168 hours. No results for input(s): AMMONIA in the last 168 hours.  ABG    Component Value Date/Time   PHART 7.472 (H) 08/21/2019 0057   PCO2ART 32.1 08/21/2019 0057   PO2ART 71.0 (L) 08/21/2019 0057   HCO3 23.5 08/21/2019 0057   TCO2 24 08/21/2019 0057   ACIDBASEDEF 2.0 08/20/2019 0007   O2SAT 95.0 08/21/2019 0057     Coagulation Profile: Recent Labs  Lab 08/16/19 1107 08/17/19 0913  INR 1.6* 1.5*    Cardiac Enzymes: Recent Labs  Lab 08/18/19 0313 08/18/19 1651 08/19/19 0653 08/19/19 1700 08/20/19 0628  CKTOTAL 274* 265* 89 102 52    HbA1C: Hgb A1c MFr Bld  Date/Time Value Ref Range Status  08/16/2019 01:26 AM 5.8 (H) 4.8 - 5.6 % Final    Comment:    (NOTE) Pre diabetes:          5.7%-6.4% Diabetes:              >6.4% Glycemic control for   <7.0% adults with diabetes     CBG: Recent Labs  Lab 08/22/19 1528 08/22/19 2032 08/22/19 2348 08/23/19 0402 08/23/19 [place]   Rodman Pickle, M.D. [facility] Pulmonary/Critical Care Medicine 08/23/2019 8:41 AM

## 2019-08-23 NOTE — Progress Notes (Signed)
RT bedside for treatment

## 2019-08-23 NOTE — Progress Notes (Signed)
Modified Barium Swallow Progress Note  Patient Details  Name: Elizabeth [name] MRN: [id number] Date of Birth: 08-23-1970  Today's Date: 08/23/2019  Modified Barium Swallow completed.  Full report located under Chart Review in the Imaging Section.  Brief recommendations include the following:  Clinical Impression  Pt exhibited mild oropharyngeal impairments marked by laryngeal penetration with thin due to decreased timing of laryngeal protection. She has dentures at home however last week daughter reported she never wears them when eating. She exhibited prolonged mastication, bolus formation and transit with solid texture. Pharyngeal phase marked by minimally delayed epiglottic deflection with thin barium resulting in flash, momentary penetration during swallow. At end of study, she regurgitated mild amount to pharynx and trace amount was penetrated onto her vocal cords. Thin liquids via cup or straw with small sips was safe during study. Recommend Dys 2 texture with suspected progression soon to higher texture as endurance continues to improve. Small sips with cup or straw allowed, meds whole in puree and full assist with feeding and strategies.         Swallow Evaluation Recommendations       SLP Diet Recommendations: Dysphagia 2 (Fine chop) solids;Thin liquid   Liquid Administration via: Cup;Straw   Medication Administration: Whole meds with puree   Supervision: Full assist for feeding;Staff to assist with self feeding   Compensations: Minimize environmental distractions;Slow rate;Small sips/bites;Clear throat intermittently   Postural Changes: Seated upright at 90 degrees   Oral Care Recommendations: Oral care BID        Elizabeth [name] 08/23/2019,4:31 PM  Elizabeth [name].Ed Risk analyst [phone] Office [phone]

## 2019-08-23 NOTE — Progress Notes (Signed)
Physical Therapy Treatment Patient Details Name: Elizabeth [name] MRN: [id number] DOB: [birth year] Today's Date: 08/23/2019    History of Present Illness 49 year old woman with a history of HIV, COPD, seizure d/o, polysubstance abuse, with new AMS.  Admitted with [facility] 08/17/19 after daughter found pill bottles half empty after new prescription filled 08/09/19. Intubated 3/16 and transferred to ICU    PT Comments    Pt with much improved mentation and ability to participate in therapy today. Pt received on [facility], was assisted OOB by RN. Pt required +2 min assist transfers. Pt fatigued after after having bath this morning and sitting on BSC for BM. Pt in recliner with feet elevated at end of session. Will plan to progress gait next session.     Follow Up Recommendations  SNF     Equipment Recommendations  Other (comment)(defer to next venue)    Recommendations for Other Services       Precautions / Restrictions Precautions Precautions: Fall;Other (comment) Precaution Comments: cortrak    Mobility  Bed Mobility               General bed mobility comments: Pt received on BSC, left in chair.  Transfers Overall transfer level: Needs assistance Equipment used: None Transfers: Sit to/from Stand Sit to Stand: +2 physical assistance;Min assist         General transfer comment: pulled up from [facility] to rail on bed, brought chair up behind and pt remained up in chair, cues for sequencing/safety  Ambulation/Gait             General Gait Details: defered due to fatigue   Stairs             Wheelchair Mobility    Modified Rankin (Stroke Patients Only)       Balance Overall balance assessment: Needs assistance Sitting-balance support: No upper extremity supported;Feet supported Sitting balance-Leahy Scale: Fair     Standing balance support: Bilateral upper extremity supported Standing balance-Leahy Scale: Poor Standing balance comment: static stand with min assist  and BUE support                            Cognition Arousal/Alertness: Awake/alert Behavior During Therapy: Flat affect Overall Cognitive Status: Impaired/Different from baseline Area of Impairment: Memory;Following commands;Orientation;Problem solving;Safety/judgement;Awareness;Attention                 Orientation Level: Disoriented to;Time Current Attention Level: Sustained Memory: Decreased short-term memory Following Commands: Follows one step commands with increased time Safety/Judgement: Decreased awareness of safety;Decreased awareness of deficits Awareness: Intellectual Problem Solving: Slow processing;Difficulty sequencing;Decreased initiation;Requires verbal cues;Requires tactile cues General Comments: Alert and conversive. Confusion noted.      Exercises      General Comments General comments (skin integrity, edema, etc.): VSS on 2L O2      Pertinent Vitals/Pain Pain Assessment: No/denies pain Faces Pain Scale: Hurts a little bit Pain Intervention(s): Monitored during session    Home Living Family/patient expects to be discharged to:: Private residence Living Arrangements: Spouse/significant other Available Help at Discharge: Family                Prior Function Level of Independence: Independent          PT Goals (current goals can now be found in the care plan section) Acute Rehab PT Goals Patient Stated Goal: get better Progress towards PT goals: Progressing toward goals    Frequency    Min 2X/week  PT Plan Current plan remains appropriate    Co-evaluation PT/OT/SLP Co-Evaluation/Treatment: Yes Reason for Co-Treatment: For patient/therapist safety;Necessary to address cognition/behavior during functional activity PT goals addressed during session: Mobility/safety with mobility;Balance OT goals addressed during session: ADL's and self-care      AM-PAC PT "6 Clicks" Mobility   Outcome Measure  Help needed  turning from your back to your side while in a flat bed without using bedrails?: A Lot Help needed moving from lying on your back to sitting on the side of a flat bed without using bedrails?: A Lot Help needed moving to and from a bed to a chair (including a wheelchair)?: A Lot Help needed standing up from a chair using your arms (e.g., wheelchair or bedside chair)?: A Little Help needed to walk in hospital room?: A Lot Help needed climbing 3-5 steps with a railing? : Total 6 Click Score: 12    End of Session Equipment Utilized During Treatment: Gait belt;Oxygen Activity Tolerance: Patient tolerated treatment well Patient left: in chair;with chair alarm set;with call bell/phone within reach Nurse Communication: Mobility status PT Visit Diagnosis: Unsteadiness on feet (R26.81);Other abnormalities of gait and mobility (R26.89);Muscle weakness (generalized) (M62.81);Difficulty in walking, not elsewhere classified (R26.2);Other symptoms and signs involving the nervous system (V78.588)     Time: 5027-7412 PT Time Calculation (min) (ACUTE ONLY): 23 min  Charges:  $Therapeutic Activity: 8-22 mins                     Lorrin Goodell, PT  Office # [phone] Pager [phone]    Elizabeth [name] 08/23/2019, 12:59 PM

## 2019-08-24 DIAGNOSIS — R778 Other specified abnormalities of plasma proteins: Secondary | ICD-10-CM

## 2019-08-24 DIAGNOSIS — R7401 Elevation of levels of liver transaminase levels: Secondary | ICD-10-CM

## 2019-08-24 LAB — CBC
HCT: 28.6 % — ABNORMAL LOW (ref 36.0–46.0)
Hemoglobin: 8.6 g/dL — ABNORMAL LOW (ref 12.0–15.0)
MCH: 27.4 pg (ref 26.0–34.0)
MCHC: 30.1 g/dL (ref 30.0–36.0)
MCV: 91.1 fL (ref 80.0–100.0)
Platelets: 356 10*3/uL (ref 150–400)
RBC: 3.14 MIL/uL — ABNORMAL LOW (ref 3.87–5.11)
RDW: 19.7 % — ABNORMAL HIGH (ref 11.5–15.5)
WBC: 10.1 10*3/uL (ref 4.0–10.5)
nRBC: 0 % (ref 0.0–0.2)

## 2019-08-24 LAB — GLUCOSE, CAPILLARY
Glucose-Capillary: 110 mg/dL — ABNORMAL HIGH (ref 70–99)
Glucose-Capillary: 131 mg/dL — ABNORMAL HIGH (ref 70–99)
Glucose-Capillary: 135 mg/dL — ABNORMAL HIGH (ref 70–99)
Glucose-Capillary: 139 mg/dL — ABNORMAL HIGH (ref 70–99)

## 2019-08-24 LAB — COMPREHENSIVE METABOLIC PANEL
ALT: 97 U/L — ABNORMAL HIGH (ref 0–44)
AST: 22 U/L (ref 15–41)
Albumin: 2.4 g/dL — ABNORMAL LOW (ref 3.5–5.0)
Alkaline Phosphatase: 70 U/L (ref 38–126)
Anion gap: 7 (ref 5–15)
BUN: 27 mg/dL — ABNORMAL HIGH (ref 6–20)
CO2: 24 mmol/L (ref 22–32)
Calcium: 8.4 mg/dL — ABNORMAL LOW (ref 8.9–10.3)
Chloride: 116 mmol/L — ABNORMAL HIGH (ref 98–111)
Creatinine, Ser: 1.07 mg/dL — ABNORMAL HIGH (ref 0.44–1.00)
GFR calc Af Amer: >60 mL/min (ref >60)
GFR calc non Af Amer: >60 mL/min (ref >60)
Glucose, Bld: 123 mg/dL — ABNORMAL HIGH (ref 70–99)
Potassium: 3.2 mmol/L — ABNORMAL LOW (ref 3.5–5.1)
Sodium: 147 mmol/L — ABNORMAL HIGH (ref 135–145)
Total Bilirubin: 0.8 mg/dL (ref 0.3–1.2)
Total Protein: 4.8 g/dL — ABNORMAL LOW (ref 6.5–8.1)

## 2019-08-24 LAB — SODIUM
Sodium: 147 mmol/L — ABNORMAL HIGH (ref 135–145)
Sodium: 147 mmol/L — ABNORMAL HIGH (ref 135–145)
Sodium: 144 mmol/L (ref 135–145)

## 2019-08-24 MED ORDER — DOLUTEGRAVIR SODIUM 50 MG PO TABS
50.0000 mg | ORAL_TABLET | Freq: Every day | ORAL | Status: DC
  Administered 2019-08-25 – 2019-08-26 (×2): 50 mg via ORAL
  Filled 2019-08-24 (×2): qty 1

## 2019-08-24 MED ORDER — POTASSIUM CHLORIDE 20 MEQ/15ML (10%) PO SOLN: 40.0000 meq | Freq: Once | ORAL | Status: DC

## 2019-08-24 MED ORDER — EMTRICITABINE-TENOFOVIR AF 200-25 MG PO TABS
1.0000 | ORAL_TABLET | Freq: Every day | ORAL | Status: DC
  Administered 2019-08-24 – 2019-08-26 (×3): 1
  Filled 2019-08-24 (×3): qty 1

## 2019-08-24 MED ORDER — POTASSIUM CHLORIDE 20 MEQ/15ML (10%) PO SOLN
30.0000 meq | ORAL | Status: AC
  Administered 2019-08-24 (×2): 30 meq
  Filled 2019-08-24 (×2): qty 30

## 2019-08-24 NOTE — Progress Notes (Signed)
[facility] ADULT ICU REPLACEMENT PROTOCOL FOR AM LAB REPLACEMENT ONLY  The patient does apply for the [facility] Adult ICU Electrolyte Replacment Protocol based on the criteria listed below:   1. Is GFR >/= 40 ml/min? Yes.    Patient's GFR today is >60 2. Is urine output >/= 0.5 ml/kg/hr for the last 6 hours? Yes.   Patient's UOP is 1.17 ml/kg/hr 3. Is BUN < 60 mg/dL? Yes.    Patient's BUN today is 27 4. Abnormal electrolyte(s): k 3.2 5. Ordered repletion with: protocol per tube 6. If a panic level lab has been reported, has the CCM MD in charge been notified? No..   Physician:

## 2019-08-24 NOTE — Progress Notes (Signed)
PROGRESS NOTE  Elizabeth [name] JXB:[id number] DOB: 01/29/1971 DOA: 08/15/2019 PCP: Edison Pace, MD   LOS: 9 days   Brief Narrative / Interim history: 49 year old female with history of HIV, COPD, seizure disorder, polysubstance abuse who came into the hospital for suspected unintentional overdose.  Daughter found the patient unresponsive, pill bottles near half empty afternoon prescription filled on 08/09/2019, and called EMS and was brought to the hospital.  She initially presented to [facility], ED, developed progressive somnolence and ended up intubated and transferred to [facility] ICU.  Significant events 3/14 admitted with suspected overdose Intubated 3/14, extubated 3/17 Transferred to the hospitalist service 3/23  Subjective / 24h Interval events: She is a bit sleepy this morning, but wakes up easily, she has no complaints.  Denies any abdominal pain, no nausea or vomiting.  No chest pain.  She is feeling extremely weak  Assessment & Plan: Principal Problem Acute hypoxic respiratory failure in the setting of unintentional suspected overdose /acute metabolic encephalopathy /COPD exacerbation -Due to somnolence patient initially required to be intubated for airway protection, she was successfully extubated on 3/17. -Her encephalopathy was initially persistent after extubation, and had prolonged altered mental status without clear improvement, she underwent EEG which was negative, she underwent an MRI with punctate area concerning for hypoxic/anoxic injury but this is not expected to contribute significantly -She is eventually improving, more alert this morning, she able to be evaluated by speech pathology who cleared her for dysphagia 2 diet.  She also underwent an MBS -Continue supportive treatment, wean off to room air as tolerated.  Transferred to aggressive  Active Problems Hypernatremia -Improving, encourage p.o. intake including free water, continue to monitor.  May need D5W by tomorrow  if sodium is not improving  HIV -Continue home medications  Essential hypertension -Blood pressure acceptable, continue clonidine  History of seizures -EEG negative, continue Keppra, seizure precautions   Scheduled Meds: . chlorhexidine  15 mL Mouth Rinse BID  . Chlorhexidine Gluconate Cloth  6 each Topical Daily  . cloNIDine  0.3 mg Per Tube TID  . dolutegravir  50 mg Oral Daily   Or  . emtricitabine-tenofovir AF  1 tablet Per Tube Daily  . feeding supplement (JEVITY 1.2 CAL)  1,000 mL Per Tube Q24H  . free water  300 mL Per Tube Q2H  . heparin  5,000 Units Subcutaneous Q8H  . insulin aspart  0-6 Units Subcutaneous Q4H  . levETIRAcetam  500 mg Per Tube BID  . mouth rinse  15 mL Mouth Rinse BID  . mometasone-formoterol  2 puff Inhalation BID  . QUEtiapine  25 mg Per Tube BID  . umeclidinium bromide  1 puff Inhalation Daily   Continuous Infusions: . sodium chloride Stopped (08/23/19 0827)   PRN Meds:.fentaNYL (SUBLIMAZE) injection, hydrALAZINE, ipratropium-albuterol, labetalol, midazolam, midazolam, ondansetron (ZOFRAN) IV, oxyCODONE-acetaminophen, phenol  DVT prophylaxis: SCDs Code Status: Full code Family Communication: No family at bedside Patient admitted from: Home Anticipated d/c place: Home, PT evaluate Barriers to d/c: Still encephalopathic, hyponatremic, poor p.o. intake, not safe for discharge.  Hopefully she will continue to improve at the same rate and will be able to be discharged in 48-72 hours, Avenue to be determined  Consultants:  PCCM  Procedures:  2D echo:  IMPRESSIONS    1. Endocardial border defnition is poor. Systolic function appears  normal.. Left ventricular ejection fraction, by estimation, is 55 to 60%.  The left ventricle has normal function. The left ventricle has no regional  wall motion abnormalities.  Left  ventricular diastolic parameters were normal.  2. Right ventricular systolic function is normal. The right ventricular  size  is normal. There is moderately elevated pulmonary artery systolic  pressure.  3. The mitral valve is normal in structure. Trivial mitral valve  regurgitation. No evidence of mitral stenosis.  4. The aortic valve is tricuspid. Aortic valve regurgitation is not  visualized. No aortic stenosis is present.  5. The inferior vena cava is dilated in size with <50% respiratory  variability, suggesting right atrial pressure of 15 mmHg.   Microbiology  Blood cultures-no growth, final Tracheal aspirate-moderate gram-positive rods, rare gram-positive cocci, final, normal respiratory flora  Antimicrobials: None     Objective: Vitals:   08/24/19 0348 08/24/19 0409 08/24/19 0700 08/24/19 0834  BP:  (!) 141/86    Pulse:  81    Resp:  16    Temp: 99.6 F (37.6 C)  100 F (37.8 C)   TempSrc: Oral  Axillary   SpO2:  95%  92%  Weight:      Height:        Intake/Output Summary (Last 24 hours) at 08/24/2019 0941 Last data filed at 08/24/2019 0600 Gross per 24 hour  Intake 1200 ml  Output 1825 ml  Net -625 ml   Filed Weights   08/21/19 0356 08/22/19 0135 08/23/19 0500  Weight: 68.3 kg 68.5 kg 71.3 kg    Examination:  Constitutional: No distress, laying in bed, sleeping Eyes: no scleral icterus ENMT: Mucous membranes are moist.  Neck: normal, supple Respiratory: clear to auscultation bilaterally, no wheezing, no crackles. Normal respiratory effort.  Shallow respirations Cardiovascular: Regular rate and rhythm, no murmurs / rubs / gallops.  Abdomen: non distended, no tenderness. Bowel sounds positive.  Musculoskeletal: no clubbing / cyanosis.  Skin: no rashes Neurologic: No focal deficits  Data Reviewed: I have independently reviewed following labs and imaging studies   CBC: Recent Labs  Lab 08/18/19 0313 08/18/19 0313 08/19/19 0933 08/19/19 0933 08/20/19 0007 08/21/19 0057 08/22/19 0121 08/23/19 0323 08/24/19 0301  WBC 5.9  --  10.9*  --   --   --  7.2 12.4* 10.1  HGB  9.6*   < > 10.5*   < > 10.2* 10.5* 9.8* 10.2* 8.6*  HCT 30.9*   < > 33.5*   < > 30.0* 31.0* 32.5* 34.1* 28.6*  MCV 87.0  --  86.8  --   --   --  90.3 90.9 91.1  PLT 262  --  394  --   --   --  474* 484* 356   < > = values in this interval not displayed.   Basic Metabolic Panel: Recent Labs  Lab 08/17/19 1624 08/18/19 0313 08/19/19 0933 08/19/19 1700 08/20/19 0007 08/20/19 3235 08/20/19 1831 08/21/19 0057 08/22/19 0121 08/22/19 0121 08/22/19 0901 08/22/19 0901 08/22/19 1616 08/23/19 0323 08/23/19 1426 08/23/19 2056 08/24/19 0301  NA  --    < > 153*  --    < >  --   --    < > 162*   < > 159*   < > 153* 154* 149* 148* 147*  K  --    < > 4.6  --    < >  --   --    < > 3.8  --  4.1  --  4.0 3.1*  --   --  3.2*  CL  --    < > 124*  --   --   --   --    < > >  130*  --  130*  --  121* 120*  --   --  116*  CO2  --    < > 17*  --   --   --   --    < > 24  --  25  --  23 23  --   --  24  GLUCOSE  --    < > 171*  --   --   --   --    < > 178*  --  160*  --  277* 150*  --   --  123*  BUN  --    < > 60*  --   --   --   --    < > 53*  --  49*  --  42* 34*  --   --  27*  CREATININE  --    < > 1.38*  --   --   --   --    < > 1.00  --  0.99  --  1.07* 0.91  --   --  1.07*  CALCIUM  --    < > 9.1  --   --   --   --    < > 8.8*  --  8.7*  --  8.9 8.8*  --   --  8.4*  MG 2.1  --  2.1 2.2  --  2.2 2.3  --   --   --   --   --   --   --   --   --   --   PHOS 4.0  --  4.0 3.9  --  3.7 3.3  --   --   --   --   --   --   --   --   --   --    < > = values in this interval not displayed.   Liver Function Tests: Recent Labs  Lab 08/19/19 0933 08/21/19 0316 08/22/19 0121 08/23/19 0323 08/24/19 0301  AST 79* 26 19 28 22   ALT 697* 289* 199* 155* 97*  ALKPHOS 117 94 91 89 70  BILITOT 1.1 0.8 0.7 0.6 0.8  PROT 5.6* 5.0* 5.0* 5.4* 4.8*  ALBUMIN 2.8* 2.6* 2.5* 2.7* 2.4*   Coagulation Profile: No results for input(s): INR, PROTIME in the last 168 hours. HbA1C: No results for input(s): HGBA1C in the  last 72 hours. CBG: Recent Labs  Lab 08/23/19 1504 08/23/19 1916 08/23/19 2311 08/24/19 0317 08/24/19 0759  GLUCAP 210* 124* 120* 110* 131*    Recent Results (from the past 240 hour(s))  Respiratory Panel by RT PCR (Flu A&B, Covid) - Nasopharyngeal Swab     Status: None   Collection Time: 08/15/19 12:27 PM   Specimen: Nasopharyngeal Swab  Result Value Ref Range Status   SARS Coronavirus 2 by RT PCR NEGATIVE NEGATIVE Final    Comment: (NOTE) SARS-CoV-2 target nucleic acids are NOT DETECTED. The SARS-CoV-2 RNA is generally detectable in upper respiratoy specimens during the acute phase of infection. The lowest concentration of SARS-CoV-2 viral copies this assay can detect is 131 copies/mL. A negative result does not preclude SARS-Cov-2 infection and should not be used as the sole basis for treatment or other patient management decisions. A negative result may occur with  improper specimen collection/handling, submission of specimen other than nasopharyngeal swab, presence of viral mutation(s) within the areas targeted by this assay, and inadequate number of viral copies (<131 copies/mL). A negative  result must be combined with clinical observations, patient history, and epidemiological information. The expected result is Negative. Fact Sheet for Patients:  PinkCheek.be Fact Sheet for Healthcare Providers:  GravelBags.it This test is not yet ap proved or cleared by the [place] FDA and  has been authorized for detection and/or diagnosis of SARS-CoV-2 by FDA under an Emergency Use Authorization (EUA). This EUA will remain  in effect (meaning this test can be used) for the duration of the COVID-19 declaration under Section 564(b)(1) of the Act, 21 U.S.C. section 360bbb-3(b)(1), unless the authorization is terminated or revoked sooner.    Influenza A by PCR NEGATIVE NEGATIVE Final   Influenza B by PCR NEGATIVE  NEGATIVE Final    Comment: (NOTE) The Xpert Xpress SARS-CoV-2/FLU/RSV assay is intended as an aid in  the diagnosis of influenza from Nasopharyngeal swab specimens and  should not be used as a sole basis for treatment. Nasal washings and  aspirates are unacceptable for Xpert Xpress SARS-CoV-2/FLU/RSV  testing. Fact Sheet for Patients: PinkCheek.be Fact Sheet for Healthcare Providers: GravelBags.it This test is not yet approved or cleared by the [place] FDA and  has been authorized for detection and/or diagnosis of SARS-CoV-2 by  FDA under an Emergency Use Authorization (EUA). This EUA will remain  in effect (meaning this test can be used) for the duration of the  Covid-19 declaration under Section 564(b)(1) of the Act, 21  U.S.C. section 360bbb-3(b)(1), unless the authorization is  terminated or revoked. Performed at [facility], [address] [place], [place] [address]   MRSA PCR Screening     Status: None   Collection Time: 08/15/19  9:15 PM   Specimen: Nasopharyngeal  Result Value Ref Range Status   MRSA by PCR NEGATIVE NEGATIVE Final    Comment:        The GeneXpert MRSA Assay (FDA approved for NASAL specimens only), is one component of a comprehensive MRSA colonization surveillance program. It is not intended to diagnose MRSA infection nor to guide or monitor treatment for MRSA infections. Performed at [facility], [place] [address] [place], [place] [address]   Culture, blood (routine x 2)     Status: None   Collection Time: 08/16/19  1:26 AM   Specimen: BLOOD LEFT HAND  Result Value Ref Range Status   Specimen Description BLOOD LEFT HAND  Final   Special Requests   Final    BOTTLES DRAWN AEROBIC ONLY Blood Culture results may not be optimal due to an inadequate volume of blood received in culture bottles   Culture   Final    NO GROWTH 5 DAYS Performed at [facility], [place] [address],  [place], [place] [address]    Report Status 08/21/2019 FINAL  Final  Culture, blood (routine x 2)     Status: None   Collection Time: 08/16/19  1:26 AM   Specimen: BLOOD  Result Value Ref Range Status   Specimen Description BLOOD LEFT THUMB  Final   Special Requests   Final    BOTTLES DRAWN AEROBIC ONLY Blood Culture results may not be optimal due to an inadequate volume of blood received in culture bottles   Culture   Final    NO GROWTH 5 DAYS Performed at [facility], [place] [address], [place], [place] [address]    Report Status 08/21/2019 FINAL  Final  Culture, respiratory (tracheal aspirate)     Status: None   Collection Time: 08/16/19  1:45 AM  Specimen: Tracheal Aspirate; Respiratory  Result Value Ref Range Status   Specimen Description TRACHEAL ASPIRATE  Final   Special Requests NONE  Final   Gram Stain   Final    ABUNDANT WBC PRESENT, PREDOMINANTLY PMN MODERATE GRAM POSITIVE RODS RARE GRAM POSITIVE COCCI    Culture   Final    Consistent with normal respiratory flora. Performed at [facility], [place] [address], [place], [place] [address]    Report Status 08/18/2019 FINAL  Final     Radiology Studies: DG Swallowing Func-Speech Pathology  Result Date: 08/23/2019 Objective Swallowing Evaluation: Type of Study: MBS-Modified Barium Swallow Study  Patient Details Name: Anslee Micheletti MRN: [id number] Date of Birth: [birth year] Today's Date: 08/23/2019 Time: SLP Start Time (ACUTE ONLY): 1342 -SLP Stop Time (ACUTE ONLY): 1403 SLP Time Calculation (min) (ACUTE ONLY): 21 min Past Medical History: Past Medical History: Diagnosis Date . Asthma   uses Albuterol inhaler daily as needed . COPD (chronic obstructive pulmonary disease) ([place])  . Depression   takes Trazodone and Seroquel nightly . Dizziness  . Dysphagia  . Headache(784.0)  . History of migraine   last one about 2wks ago . HIV (human immunodeficiency virus infection) ([place])   takes Stribild daily . Lung cancer ([place])  . Nocturia  .  Seizures ([place])   takes Depakote daily-last one about 6 months ago as of 01/02/19 . Shortness of breath   sitting/lying/exertion . Substance abuse ([place])  . Urinary frequency  Past Surgical History: Past Surgical History: Procedure Laterality Date . BREAST SURGERY Left   mass removed from left breast-fatty tissue . CESAREAN SECTION  64yr ago . VIDEO ASSISTED THORACOSCOPY (VATS)/WEDGE RESECTION Right 07/05/2013  Procedure: VIDEO ASSISTED THORACOSCOPY (VATS)/WEDGE RESECTION, Right Upper Lobectomy with lymph node disecction and OnQ placement;  Surgeon: SMelrose Nakayama MD;  Location: [facility]  Service: Thoracic;  Laterality: Right;  (R)VATS, WEDGE RESECTION, POSSIBLE LOBECTOMY, POSSIBLE CHEST WALL RESECTION . VIDEO BRONCHOSCOPY Bilateral 05/05/2013  Procedure: VIDEO BRONCHOSCOPY WITH FLUORO;  Surgeon: MTanda Rockers MD;  Location: [facility];  Service: Cardiopulmonary;  Laterality: Bilateral; HPI: 49year old woman with a history of HIV, COPD, seizure d/o, polysubstance abuse, with new AMS. Daughter called EMS for suspected overdose, found pill bottles half empty after new prescription filled 3/8/2Benzos and opiates pos on urine tox. Intubated 3/14-3/17. Acute Hypoxemic Respiratory Insufficiency - multifactorial in setting AMS, suspected Aspiration Pneumonitis with associated Bronchospasm; acute metabloic encephalopathy, AKI secondary to rhabdomyolosis, acute liver failure.  No data recorded Assessment / Plan / Recommendation CHL IP CLINICAL IMPRESSIONS 08/23/2019 Clinical Impression Pt exhibited mild oropharyngeal impairments marked by laryngeal penetration with thin due to decreased timing of laryngeal protection. She has dentures at home however last week daughter reported she never wears them when eating. She exhibited prolonged mastication, bolus formation and transit with solid texture. Pharyngeal phase marked by minimally delayed epiglottic deflection with thin barium resulting in flash, momentary penetration  during swallow. At end of study, she regurgitated mild amount to pharynx and trace amount was penetrated onto her vocal cords. Thin liquids via cup or straw with small sips was safe during study. Recommend Dys 2 texture with suspected progression soon to higher texture as endurance continues to improve. Small sips with cup or straw allowed, meds whole in puree and full assist with feeding and strategies.       SLP Visit Diagnosis Dysphagia, oropharyngeal phase (R13.12) Attention and concentration deficit following -- Frontal lobe and executive function deficit following -- Impact on  safety and function Mild aspiration risk;Moderate aspiration risk   CHL IP TREATMENT RECOMMENDATION 08/23/2019 Treatment Recommendations Therapy as outlined in treatment plan below   Prognosis 08/23/2019 Prognosis for Safe Diet Advancement Good Barriers to Reach Goals Cognitive deficits Barriers/Prognosis Comment -- CHL IP DIET RECOMMENDATION 08/23/2019 SLP Diet Recommendations Dysphagia 2 (Fine chop) solids;Thin liquid Liquid Administration via Cup;Straw Medication Administration Whole meds with puree Compensations Minimize environmental distractions;Slow rate;Small sips/bites;Clear throat intermittently Postural Changes Seated upright at 90 degrees   CHL IP OTHER RECOMMENDATIONS 08/23/2019 Recommended Consults -- Oral Care Recommendations Oral care BID Other Recommendations --   CHL IP FOLLOW UP RECOMMENDATIONS 08/23/2019 Follow up Recommendations 24 hour supervision/assistance   CHL IP FREQUENCY AND DURATION 08/23/2019 Speech Therapy Frequency (ACUTE ONLY) min 2x/week Treatment Duration 2 weeks      CHL IP ORAL PHASE 08/23/2019 Oral Phase Impaired Oral - Pudding Teaspoon -- Oral - Pudding Cup -- Oral - Honey Teaspoon -- Oral - Honey Cup -- Oral - Nectar Teaspoon -- Oral - Nectar Cup Lingual/palatal residue Oral - Nectar Straw WFL Oral - Thin Teaspoon -- Oral - Thin Cup Lingual/palatal residue Oral - Thin Straw Lingual/palatal residue Oral -  Puree Delayed oral transit Oral - Mech Soft -- Oral - Regular Other (Comment) Oral - Multi-Consistency -- Oral - Pill -- Oral Phase - Comment --  CHL IP PHARYNGEAL PHASE 08/23/2019 Pharyngeal Phase Impaired Pharyngeal- Pudding Teaspoon -- Pharyngeal -- Pharyngeal- Pudding Cup -- Pharyngeal -- Pharyngeal- Honey Teaspoon -- Pharyngeal -- Pharyngeal- Honey Cup -- Pharyngeal -- Pharyngeal- Nectar Teaspoon -- Pharyngeal -- Pharyngeal- Nectar Cup WFL Pharyngeal -- Pharyngeal- Nectar Straw WFL Pharyngeal -- Pharyngeal- Thin Teaspoon -- Pharyngeal -- Pharyngeal- Thin Cup [facility] Pharyngeal Material does not enter airway Pharyngeal- Thin Straw Penetration/Aspiration during swallow Pharyngeal Material enters airway, remains ABOVE vocal cords then ejected out;Material enters airway, CONTACTS cords and then ejected out;Material enters airway, CONTACTS cords and not ejected out Pharyngeal- Puree WFL Pharyngeal -- Pharyngeal- Mechanical Soft -- Pharyngeal -- Pharyngeal- Regular WFL Pharyngeal -- Pharyngeal- Multi-consistency -- Pharyngeal -- Pharyngeal- Pill -- Pharyngeal -- Pharyngeal Comment --  CHL IP CERVICAL ESOPHAGEAL PHASE 08/23/2019 Cervical Esophageal Phase WFL Pudding Teaspoon -- Pudding Cup -- Honey Teaspoon -- Honey Cup -- Nectar Teaspoon -- Nectar Cup -- Nectar Straw -- Thin Teaspoon -- Thin Cup -- Thin Straw -- Puree -- Mechanical Soft -- Regular -- Multi-consistency -- Pill -- Cervical Esophageal Comment -- Houston Siren 08/23/2019, 4:31 PM Orbie Pyo Litaker M.Ed Actor Pager [phone] Office [phone]              Marzetta Board, MD, PhD [facility]  Between 7 am - 7 pm I am available, please contact me via Amion or Securechat  Between 7 pm - 7 am I am not available, please contact night coverage MD/APP via Amion

## 2019-08-24 NOTE — Progress Notes (Signed)
  Speech Language Pathology Treatment: Dysphagia  Patient Details Name: Elizabeth [name] MRN: [id number] DOB: 02-25-1971 Today's Date: 08/24/2019 Time: 0912-0927 SLP Time Calculation (min) (ACUTE ONLY): 15 min  Assessment / Plan / Recommendation Clinical Impression  Pt's lucidity to situation, medical status is improving daily- she was able to state that her is planning to retrieve HIV meds for medical staff to have available. Pt did not report coughing or other s/s aspiration with dinner or breakfast this am. She consumed straw sips water and graham cracker in applesauce to resemble Dys 2 texture without concern for decreased airway protection. She began to have low grade, elevated temps day prior to starting po's. Primary issue currently is her decreased appetite and intake. She was getting significant amount of free water per RN who requested a reduction with MD. Pt stated she does not like Ensure due to bloating effect. Encouraged her to eat highest protein food item first. Would not recommend upgrading from Dys 2 quite yet however expect this to be soon as her endurance continues to increase. Continue thin, pills crushed due to gagging when pill cut in half in applesauce. Will continue to follow.      HPI HPI: 49 year old woman with a history of HIV, COPD, seizure d/o, polysubstance abuse, with new AMS. Daughter called EMS for suspected overdose, found pill bottles half empty after new prescription filled 3/8/2Benzos and opiates pos on urine tox. Intubated 3/14-3/17. Acute Hypoxemic Respiratory Insufficiency - multifactorial in setting AMS, suspected Aspiration Pneumonitis with associated Bronchospasm; acute metabloic encephalopathy, AKI secondary to rhabdomyolosis, acute liver failure.       SLP Plan  Continue with current plan of care       Recommendations  Diet recommendations: Dysphagia 2 (fine chop);Thin liquid Liquids provided via: Cup;Straw Medication Administration: Crushed with  puree(gagged with whole in puree) Supervision: Patient able to self feed;Full supervision/cueing for compensatory strategies Compensations: Minimize environmental distractions;Slow rate;Small sips/bites;Clear throat intermittently Postural Changes and/or Swallow Maneuvers: Seated upright 90 degrees                Oral Care Recommendations: Oral care BID Follow up Recommendations: 24 hour supervision/assistance SLP Visit Diagnosis: Dysphagia, oropharyngeal phase (R13.12) Plan: Continue with current plan of care       GO                Houston Siren 08/24/2019, 9:58 AM

## 2019-08-24 NOTE — Progress Notes (Deleted)
Per patient

## 2019-08-25 LAB — GLUCOSE, CAPILLARY
Glucose-Capillary: 117 mg/dL — ABNORMAL HIGH (ref 70–99)
Glucose-Capillary: 120 mg/dL — ABNORMAL HIGH (ref 70–99)
Glucose-Capillary: 126 mg/dL — ABNORMAL HIGH (ref 70–99)
Glucose-Capillary: 150 mg/dL — ABNORMAL HIGH (ref 70–99)
Glucose-Capillary: 158 mg/dL — ABNORMAL HIGH (ref 70–99)
Glucose-Capillary: 164 mg/dL — ABNORMAL HIGH (ref 70–99)
Glucose-Capillary: 168 mg/dL — ABNORMAL HIGH (ref 70–99)
Glucose-Capillary: 170 mg/dL — ABNORMAL HIGH (ref 70–99)
Glucose-Capillary: 196 mg/dL — ABNORMAL HIGH (ref 70–99)

## 2019-08-25 LAB — CBC
HCT: 28.4 % — ABNORMAL LOW (ref 36.0–46.0)
Hemoglobin: 8.7 g/dL — ABNORMAL LOW (ref 12.0–15.0)
MCH: 27.6 pg (ref 26.0–34.0)
MCHC: 30.6 g/dL (ref 30.0–36.0)
MCV: 90.2 fL (ref 80.0–100.0)
Platelets: 300 10*3/uL (ref 150–400)
RBC: 3.15 MIL/uL — ABNORMAL LOW (ref 3.87–5.11)
RDW: 19.1 % — ABNORMAL HIGH (ref 11.5–15.5)
WBC: 9.5 10*3/uL (ref 4.0–10.5)
nRBC: 0.2 % (ref 0.0–0.2)

## 2019-08-25 LAB — COMPREHENSIVE METABOLIC PANEL
ALT: 75 U/L — ABNORMAL HIGH (ref 0–44)
AST: 21 U/L (ref 15–41)
Albumin: 2.4 g/dL — ABNORMAL LOW (ref 3.5–5.0)
Alkaline Phosphatase: 63 U/L (ref 38–126)
Anion gap: 7 (ref 5–15)
BUN: 18 mg/dL (ref 6–20)
CO2: 24 mmol/L (ref 22–32)
Calcium: 8.2 mg/dL — ABNORMAL LOW (ref 8.9–10.3)
Chloride: 110 mmol/L (ref 98–111)
Creatinine, Ser: 1.01 mg/dL — ABNORMAL HIGH (ref 0.44–1.00)
GFR calc Af Amer: >60 mL/min (ref >60)
GFR calc non Af Amer: >60 mL/min (ref >60)
Glucose, Bld: 106 mg/dL — ABNORMAL HIGH (ref 70–99)
Potassium: 3.1 mmol/L — ABNORMAL LOW (ref 3.5–5.1)
Sodium: 141 mmol/L (ref 135–145)
Total Bilirubin: 0.7 mg/dL (ref 0.3–1.2)
Total Protein: 4.9 g/dL — ABNORMAL LOW (ref 6.5–8.1)

## 2019-08-25 LAB — PNEUMOCYSTIS JIROVECI SMEAR BY DFA: Pneumocystis jiroveci Ag: NEGATIVE

## 2019-08-25 LAB — SODIUM
Sodium: 143 mmol/L (ref 135–145)
Sodium: 143 mmol/L (ref 135–145)
Sodium: 141 mmol/L (ref 135–145)

## 2019-08-25 MED ORDER — BOOST / RESOURCE BREEZE PO LIQD CUSTOM
1.0000 | Freq: Two times a day (BID) | ORAL | Status: DC
  Administered 2019-08-25 – 2019-08-26 (×3): 1 via ORAL

## 2019-08-25 MED ORDER — JEVITY 1.2 CAL PO LIQD
720.0000 mL | ORAL | Status: DC
  Administered 2019-08-25: 720 mL
  Filled 2019-08-25 (×2): qty 1000

## 2019-08-25 MED ORDER — CARBAMIDE PEROXIDE 6.5 % OT SOLN
5.0000 [drp] | Freq: Two times a day (BID) | OTIC | Status: DC | PRN
  Filled 2019-08-25: qty 15
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
## 2019-08-25 NOTE — Progress Notes (Signed)
Occupational Therapy Treatment Patient Details Name: Elizabeth [name] MRN: [id number] DOB: Dec 28, 1970 Today's Date: 08/25/2019    History of present illness 49 year old woman with a history of HIV, COPD, seizure d/o, polysubstance abuse, with new AMS.  Admitted with [facility] 08/17/19 after daughter found pill bottles half empty after new prescription filled 08/09/19. Intubated 3/16 and transferred to ICU   OT comments  Pt is progressing well. Toileted and groomed at sink with min guard assist in standing. Min assist to don front opening gown. Walked with RW in [place] with chair following and intermittent rest breaks with min guard assist. Updated d/c to home with daughter and HHOT. VSS throughout session.  Follow Up Recommendations  Home health OT;Supervision/Assistance - 24 hour    Equipment Recommendations  Tub/shower seat    Recommendations for Other Services      Precautions / Restrictions Precautions Precautions: Fall;Other (comment) Precaution Comments: cortrak       Mobility Bed Mobility Overal bed mobility: Needs Assistance Bed Mobility: Supine to Sit     Supine to sit: Min assist     General bed mobility comments: pulled up on therapist's hand  Transfers Overall transfer level: Needs assistance Equipment used: Rolling walker (2 wheeled) Transfers: Sit to/from Stand Sit to Stand: Min guard         General transfer comment: cues for hand placement    Balance Overall balance assessment: Needs assistance   Sitting balance-Leahy Scale: Good     Standing balance support: Bilateral upper extremity supported Standing balance-Leahy Scale: Poor Standing balance comment: fair static standing at sink                           ADL either performed or assessed with clinical judgement   ADL Overall ADL's : Needs assistance/impaired Eating/Feeding: Independent;Sitting   Grooming: Wash/dry hands;Standing;Min guard;Brushing hair;Sitting;Maximal assistance            Upper Body Dressing : Minimal assistance;Sitting       Toilet Transfer: Min guard;Ambulation;RW;Regular Toilet   Toileting- Clothing Manipulation and Hygiene: Set up;Sitting/lateral lean       Functional mobility during ADLs: Min guard;Rolling walker       Vision       Perception     Praxis      Cognition Arousal/Alertness: Awake/alert Behavior During Therapy: WFL for tasks assessed/performed Overall Cognitive Status: Impaired/Different from baseline Area of Impairment: Following commands;Memory;Attention                   Current Attention Level: Selective Memory: Decreased short-term memory Following Commands: Follows one step commands with increased time(and some repetition) Safety/Judgement: Decreased awareness of deficits     General Comments: much clearer voice, unaware of her leaking cortrak in bed with soiled linens        Exercises     Shoulder Instructions       General Comments      Pertinent Vitals/ Pain       Pain Assessment: No/denies pain  Home Living                                          Prior Functioning/Environment              Frequency  Min 2X/week        Progress Toward Goals  OT Goals(current goals can  now be found in the care plan section)  Progress towards OT goals: Progressing toward goals  Acute Rehab OT Goals Patient Stated Goal: get better OT Goal Formulation: With patient Time For Goal Achievement: 09/06/19 Potential to Achieve Goals: Good  Plan Discharge plan needs to be updated    Co-evaluation    PT/OT/SLP Co-Evaluation/Treatment: Yes Reason for Co-Treatment: For patient/therapist safety   OT goals addressed during session: ADL's and self-care;Proper use of Adaptive equipment and DME      AM-PAC OT "6 Clicks" Daily Activity     Outcome Measure   Help from another person eating meals?: None Help from another person taking care of personal grooming?: A  Little Help from another person toileting, which includes using toliet, bedpan, or urinal?: A Little Help from another person bathing (including washing, rinsing, drying)?: A Little Help from another person to put on and taking off regular upper body clothing?: A Little Help from another person to put on and taking off regular lower body clothing?: A Little 6 Click Score: 19    End of Session Equipment Utilized During Treatment: Rolling walker  OT Visit Diagnosis: Muscle weakness (generalized) (M62.81);Unsteadiness on feet (R26.81)   Activity Tolerance Patient tolerated treatment well   Patient Left in chair;with call bell/phone within reach   Nurse Communication          Time: 5300-5110 OT Time Calculation (min): 41 min  Charges: OT General Charges $OT Visit: 1 Visit OT Treatments $Self Care/Home Management : 23-37 mins  Elizabeth [name], OTR/L Acute Rehabilitation Services Pager: [phone] Office: [phone]   Elizabeth [name] 08/25/2019, 2:12 PM

## 2019-08-25 NOTE — Progress Notes (Signed)
Physical Therapy Treatment Patient Details Name: Elizabeth [name] MRN: [id number] DOB: 31-Dec-1970 Today's Date: 08/25/2019    History of Present Illness 49 year old woman with a history of HIV, COPD, seizure d/o, polysubstance abuse, with new AMS.  Admitted with [name] 08/17/19 after daughter found pill bottles half empty after new prescription filled 08/09/19. Intubated 3/16 and transferred to ICU    PT Comments    Pt is making excellent progress towards her goals today, however pt continues to have decreased safety awareness and cognition of her deficits. Pt is min A for bed mobility and min guard for transfers and ambulation of 50 feet with RW. Pt is planning to go home with her daughter who will provide 24 hour support. Given pt's improvement in mobility and home supervision PT changing recommendation to [place]. PT will continue to follow acutely.   Follow Up Recommendations  Home health PT;Supervision/Assistance - 24 hour           Precautions / Restrictions Precautions Precautions: Fall;Other (comment) Precaution Comments: cortrak Restrictions Weight Bearing Restrictions: No    Mobility  Bed Mobility Overal bed mobility: Needs Assistance Bed Mobility: Supine to Sit     Supine to sit: Min assist     General bed mobility comments: pulled up on therapist's hand  Transfers Overall transfer level: Needs assistance Equipment used: Rolling walker (2 wheeled) Transfers: Sit to/from Stand Sit to Stand: Min guard         General transfer comment: cues for hand placement  Ambulation/Gait Ambulation/Gait assistance: Min guard Gait Distance (Feet): 50 Feet(1x40, 1x50 ) Assistive device: Rolling walker (2 wheeled) Gait Pattern/deviations: Step-through pattern;Decreased step length - right;Decreased step length - left;Shuffle Gait velocity: slowed Gait velocity interpretation: 1.31 - 2.62 ft/sec, indicative of limited community ambulator General Gait Details: min guard for slow,  mildly unsteady gait, vc for concentration on task at hand         Balance Overall balance assessment: Needs assistance   Sitting balance-Leahy Scale: Good     Standing balance support: Bilateral upper extremity supported Standing balance-Leahy Scale: Poor Standing balance comment: fair static standing at sink                            Cognition Arousal/Alertness: Awake/alert Behavior During Therapy: WFL for tasks assessed/performed Overall Cognitive Status: Impaired/Different from baseline Area of Impairment: Following commands;Memory;Attention                   Current Attention Level: Selective Memory: Decreased short-term memory Following Commands: Follows one step commands with increased time(and some repetition) Safety/Judgement: Decreased awareness of deficits     General Comments: much clearer voice, unaware of her leaking cortrak in bed with soiled linens         General Comments General comments (skin integrity, edema, etc.): VSS on RA      Pertinent Vitals/Pain Pain Assessment: No/denies pain           PT Goals (current goals can now be found in the care plan section) Acute Rehab PT Goals Patient Stated Goal: get better PT Goal Formulation: Patient unable to participate in goal setting Time For Goal Achievement: 09/08/19 Potential to Achieve Goals: Fair Progress towards PT goals: Progressing toward goals    Frequency    Min 3X/week      PT Plan Frequency needs to be updated;Discharge plan needs to be updated    Co-evaluation PT/OT/SLP Co-Evaluation/Treatment: Yes Reason for Co-Treatment: For  patient/therapist safety PT goals addressed during session: Mobility/safety with mobility OT goals addressed during session: ADL's and self-care;Proper use of Adaptive equipment and DME      AM-PAC PT "6 Clicks" Mobility   Outcome Measure  Help needed turning from your back to your side while in a flat bed without using  bedrails?: A Little Help needed moving from lying on your back to sitting on the side of a flat bed without using bedrails?: A Little Help needed moving to and from a bed to a chair (including a wheelchair)?: A Little Help needed standing up from a chair using your arms (e.g., wheelchair or bedside chair)?: A Little Help needed to walk in hospital room?: None Help needed climbing 3-5 steps with a railing? : A Little 6 Click Score: 19    End of Session   Activity Tolerance: Patient tolerated treatment well Patient left: in chair;Other (comment)(with OT ) Nurse Communication: Mobility status PT Visit Diagnosis: Unsteadiness on feet (R26.81);Other abnormalities of gait and mobility (R26.89);Muscle weakness (generalized) (M62.81);Difficulty in walking, not elsewhere classified (R26.2);Other symptoms and signs involving the nervous system (R97.588)     Time: 3254-9826 PT Time Calculation (min) (ACUTE ONLY): 33 min  Charges:  $Gait Training: 8-22 mins                     Woodrow Drab B. Migdalia Dk PT, DPT Acute Rehabilitation Services Pager [phone] Office [phone]    [place] 08/25/2019, 5:50 PM

## 2019-08-25 NOTE — Plan of Care (Signed)

## 2019-08-25 NOTE — Progress Notes (Signed)
PROGRESS NOTE  Elizabeth [name] VZD:[id number] DOB: 05/15/1971 DOA: 08/15/2019 PCP: Edison Pace, MD   LOS: 10 days   Brief Narrative / Interim history: 49 year old female with history of HIV, COPD, seizure disorder, polysubstance abuse who came into the hospital for suspected unintentional overdose.  Daughter found the patient unresponsive, pill bottles near half empty afternoon prescription filled on 08/09/2019, and called EMS and was brought to the hospital.  She initially presented to [facility], ED, developed progressive somnolence and ended up intubated and transferred to [facility] ICU.  Significant events 3/14 admitted with suspected overdose Intubated 3/14, extubated 3/17 Transferred to the hospitalist service 3/23  Subjective / 24h Interval events: No acute issues or events overnight, patient much more awake alert oriented this morning, denies nausea, vomiting, diarrhea, constipation, headache, fevers, chills, shortness of breath, chest pain.  Looking forward to speech evaluation today so that she can have her "nose tube" removed  Assessment & Plan:  Acute hypoxic respiratory failure in the setting of unintentional suspected overdose /acute metabolic encephalopathy /COPD exacerbation, resolving -Due to somnolence patient initially required to be intubated for airway protection, she was successfully extubated on 3/17. -Her encephalopathy was initially persistent after extubation, and had prolonged altered mental status without clear improvement, she underwent EEG which was negative, she underwent an MRI with punctate area concerning for hypoxic/anoxic injury but this is not expected to contribute significantly -Mental status back to baseline per daughter -Pending SLP eval - likely remove NG and advance diet as tolerated  Hypernatremia -Improving, encourage p.o. intake including free water, continue to monitor.  May need D5W by tomorrow if sodium is not improving  HIV -Continue home  medications  Essential hypertension -Blood pressure acceptable, continue clonidine  History of seizures -EEG negative, continue Keppra, seizure precautions   Scheduled Meds: . chlorhexidine  15 mL Mouth Rinse BID  . Chlorhexidine Gluconate Cloth  6 each Topical Daily  . cloNIDine  0.3 mg Per Tube TID  . dolutegravir  50 mg Oral Daily  . emtricitabine-tenofovir AF  1 tablet Per Tube Daily  . feeding supplement (JEVITY 1.2 CAL)  1,000 mL Per Tube Q24H  . heparin  5,000 Units Subcutaneous Q8H  . insulin aspart  0-6 Units Subcutaneous Q4H  . levETIRAcetam  500 mg Per Tube BID  . mouth rinse  15 mL Mouth Rinse BID  . mometasone-formoterol  2 puff Inhalation BID  . QUEtiapine  25 mg Per Tube BID  . umeclidinium bromide  1 puff Inhalation Daily   Continuous Infusions: . sodium chloride Stopped (08/23/19 0827)   PRN Meds:.fentaNYL (SUBLIMAZE) injection, hydrALAZINE, ipratropium-albuterol, labetalol, midazolam, midazolam, ondansetron (ZOFRAN) IV, oxyCODONE-acetaminophen, phenol  DVT prophylaxis: SCDs Code Status: Full code Family Communication: No family at bedside Patient admitted from: Home Anticipated d/c place: Home, PT evaluate Barriers to d/c: Pending continued clinical improvement likely disposition home in next 24 to 48 hours  Consultants:  PCCM  Procedures:  2D echo:  IMPRESSIONS  1. Endocardial border defnition is poor. Systolic function appears  normal.. Left ventricular ejection fraction, by estimation, is 55 to 60%.  The left ventricle has normal function. The left ventricle has no regional  wall motion abnormalities. Left  ventricular diastolic parameters were normal.  2. Right ventricular systolic function is normal. The right ventricular  size is normal. There is moderately elevated pulmonary artery systolic  pressure.  3. The mitral valve is normal in structure. Trivial mitral valve regurgitation. No evidence of mitral stenosis.  4. The aortic valve  is tricuspid.  Aortic valve regurgitation is not visualized. No aortic stenosis is present.  5. The inferior vena cava is dilated in size with <50% respiratory  variability, suggesting right atrial pressure of 15 mmHg.   Microbiology  Blood cultures-no growth, final Tracheal aspirate-moderate gram-positive rods, rare gram-positive cocci, final, normal respiratory flora  Antimicrobials: None     Objective: Vitals:   08/24/19 1753 08/25/19 0100 08/25/19 0800 08/25/19 0836  BP: (!) 156/97 96/70 132/84   Pulse: 74 68 81   Resp:  18 19   Temp: 99.8 F (37.7 C) 99.7 F (37.6 C) 99.2 F (37.3 C)   TempSrc:  Oral Oral   SpO2: 98% 95% 95% 92%  Weight:      Height:        Intake/Output Summary (Last 24 hours) at 08/25/2019 0856 Last data filed at 08/25/2019 0100 Gross per 24 hour  Intake 820 ml  Output 400 ml  Net 420 ml   Filed Weights   08/21/19 0356 08/22/19 0135 08/23/19 0500  Weight: 68.3 kg 68.5 kg 71.3 kg    Examination:  Constitutional: No distress, laying in bed, sleeping Eyes: no scleral icterus ENMT: Mucous membranes are moist.  Neck: normal, supple Respiratory: clear to auscultation bilaterally, no wheezing, no crackles. Normal respiratory effort.  Shallow respirations Cardiovascular: Regular rate and rhythm, no murmurs / rubs / gallops.  Abdomen: non distended, no tenderness. Bowel sounds positive.  Musculoskeletal: no clubbing / cyanosis.  Skin: no rashes Neurologic: No focal deficits  Data Reviewed: I have independently reviewed following labs and imaging studies   CBC: Recent Labs  Lab 08/19/19 0933 08/20/19 0007 08/21/19 0057 08/22/19 0121 08/23/19 0323 08/24/19 0301 08/25/19 0257  WBC 10.9*  --   --  7.2 12.4* 10.1 9.5  HGB 10.5*   < > 10.5* 9.8* 10.2* 8.6* 8.7*  HCT 33.5*   < > 31.0* 32.5* 34.1* 28.6* 28.4*  MCV 86.8  --   --  90.3 90.9 91.1 90.2  PLT 394  --   --  474* 484* 356 300   < > = values in this interval not displayed.   Basic Metabolic  Panel: Recent Labs  Lab 08/19/19 0933 08/19/19 1700 08/20/19 0007 08/20/19 0628 08/20/19 1831 08/21/19 0057 08/22/19 0901 08/22/19 0901 08/22/19 1616 08/22/19 1616 08/23/19 0323 08/23/19 1426 08/24/19 0301 08/24/19 0853 08/24/19 1416 08/24/19 2107 08/25/19 0257  NA 153*  --    < >  --   --    < > 159*   < > 153*   < > 154*   < > 147* 147* 147* 144 141  K 4.6  --    < >  --   --    < > 4.1  --  4.0  --  3.1*  --  3.2*  --   --   --  3.1*  CL 124*  --   --   --   --    < > 130*  --  121*  --  120*  --  116*  --   --   --  110  CO2 17*  --   --   --   --    < > 25  --  23  --  23  --  24  --   --   --  24  GLUCOSE 171*  --   --   --   --    < > 160*  --  277*  --  150*  --  123*  --   --   --  106*  BUN 60*  --   --   --   --    < > 49*  --  42*  --  34*  --  27*  --   --   --  18  CREATININE 1.38*  --   --   --   --    < > 0.99  --  1.07*  --  0.91  --  1.07*  --   --   --  1.01*  CALCIUM 9.1  --   --   --   --    < > 8.7*  --  8.9  --  8.8*  --  8.4*  --   --   --  8.2*  MG 2.1 2.2  --  2.2 2.3  --   --   --   --   --   --   --   --   --   --   --   --   PHOS 4.0 3.9  --  3.7 3.3  --   --   --   --   --   --   --   --   --   --   --   --    < > = values in this interval not displayed.   Liver Function Tests: Recent Labs  Lab 08/21/19 0316 08/22/19 0121 08/23/19 0323 08/24/19 0301 08/25/19 0257  AST 26 19 28 22 21   ALT 289* 199* 155* 97* 75*  ALKPHOS 94 91 89 70 63  BILITOT 0.8 0.7 0.6 0.8 0.7  PROT 5.0* 5.0* 5.4* 4.8* 4.9*  ALBUMIN 2.6* 2.5* 2.7* 2.4* 2.4*   Coagulation Profile: No results for input(s): INR, PROTIME in the last 168 hours. HbA1C: No results for input(s): HGBA1C in the last 72 hours. CBG: Recent Labs  Lab 08/24/19 1521 08/24/19 1943 08/25/19 0039 08/25/19 0410 08/25/19 0809  GLUCAP 164* 139* 168* 117* 158*    Recent Results (from the past 240 hour(s))  Respiratory Panel by RT PCR (Flu A&B, Covid) - Nasopharyngeal Swab     Status: None    Collection Time: 08/15/19 12:27 PM   Specimen: Nasopharyngeal Swab  Result Value Ref Range Status   SARS Coronavirus 2 by RT PCR NEGATIVE NEGATIVE Final    Comment: (NOTE) SARS-CoV-2 target nucleic acids are NOT DETECTED. The SARS-CoV-2 RNA is generally detectable in upper respiratoy specimens during the acute phase of infection. The lowest concentration of SARS-CoV-2 viral copies this assay can detect is 131 copies/mL. A negative result does not preclude SARS-Cov-2 infection and should not be used as the sole basis for treatment or other patient management decisions. A negative result may occur with  improper specimen collection/handling, submission of specimen other than nasopharyngeal swab, presence of viral mutation(s) within the areas targeted by this assay, and inadequate number of viral copies (<131 copies/mL). A negative result must be combined with clinical observations, patient history, and epidemiological information. The expected result is Negative. Fact Sheet for Patients:  PinkCheek.be Fact Sheet for Healthcare Providers:  GravelBags.it This test is not yet ap proved or cleared by the [place] FDA and  has been authorized for detection and/or diagnosis of SARS-CoV-2 by FDA under an Emergency Use Authorization (EUA). This EUA will remain  in effect (meaning this test can be used) for the duration of the COVID-19 declaration  under Section 564(b)(1) of the Act, 21 U.S.C. section 360bbb-3(b)(1), unless the authorization is terminated or revoked sooner.    Influenza A by PCR NEGATIVE NEGATIVE Final   Influenza B by PCR NEGATIVE NEGATIVE Final    Comment: (NOTE) The Xpert Xpress SARS-CoV-2/FLU/RSV assay is intended as an aid in  the diagnosis of influenza from Nasopharyngeal swab specimens and  should not be used as a sole basis for treatment. Nasal washings and  aspirates are unacceptable for Xpert Xpress  SARS-CoV-2/FLU/RSV  testing. Fact Sheet for Patients: PinkCheek.be Fact Sheet for Healthcare Providers: GravelBags.it This test is not yet approved or cleared by the [place] FDA and  has been authorized for detection and/or diagnosis of SARS-CoV-2 by  FDA under an Emergency Use Authorization (EUA). This EUA will remain  in effect (meaning this test can be used) for the duration of the  Covid-19 declaration under Section 564(b)(1) of the Act, 21  U.S.C. section 360bbb-3(b)(1), unless the authorization is  terminated or revoked. Performed at [facility], [address] [place], [place] [address]   MRSA PCR Screening     Status: None   Collection Time: 08/15/19  9:15 PM   Specimen: Nasopharyngeal  Result Value Ref Range Status   MRSA by PCR NEGATIVE NEGATIVE Final    Comment:        The GeneXpert MRSA Assay (FDA approved for NASAL specimens only), is one component of a comprehensive MRSA colonization surveillance program. It is not intended to diagnose MRSA infection nor to guide or monitor treatment for MRSA infections. Performed at [facility], [place] [address], [place], [place] [address]   Culture, blood (routine x 2)     Status: None   Collection Time: 08/16/19  1:26 AM   Specimen: BLOOD LEFT HAND  Result Value Ref Range Status   Specimen Description BLOOD LEFT HAND  Final   Special Requests   Final    BOTTLES DRAWN AEROBIC ONLY Blood Culture results may not be optimal due to an inadequate volume of blood received in culture bottles   Culture   Final    NO GROWTH 5 DAYS Performed at [facility], [place] [address] [place], [place] [address]    Report Status 08/21/2019 FINAL  Final  Culture, blood (routine x 2)     Status: None   Collection Time: 08/16/19  1:26 AM   Specimen: BLOOD  Result Value Ref Range Status   Specimen Description BLOOD LEFT THUMB  Final   Special Requests   Final    BOTTLES  DRAWN AEROBIC ONLY Blood Culture results may not be optimal due to an inadequate volume of blood received in culture bottles   Culture   Final    NO GROWTH 5 DAYS Performed at [facility], [place] [address], [place], [place] [address]    Report Status 08/21/2019 FINAL  Final  Culture, respiratory (tracheal aspirate)     Status: None   Collection Time: 08/16/19  1:45 AM   Specimen: Tracheal Aspirate; Respiratory  Result Value Ref Range Status   Specimen Description TRACHEAL ASPIRATE  Final   Special Requests NONE  Final   Gram Stain   Final    ABUNDANT WBC PRESENT, PREDOMINANTLY PMN MODERATE GRAM POSITIVE RODS RARE GRAM POSITIVE COCCI    Culture   Final    Consistent with normal respiratory flora. Performed at [facility], [place] [address] [place], [place] [address]    Report Status 08/18/2019  FINAL  Final     Radiology Studies: No results found.  Holli Humbles DO [facility]

## 2019-08-25 NOTE — Progress Notes (Signed)
Nutrition Follow-up RD working remotely.  DOCUMENTATION CODES:   Not applicable  INTERVENTION:    Change to nocturnal TF regimen: Jevity 1.2 at 60 ml/h x 12 hours 6pm-6am to provide 864 kcal, 40 gm protein, 583 ml free water daily.   Diet advancement as able per SLP.   D/C Ensure Enlive.   Try Boost Breeze po BID, each supplement provides 250 kcal and 9 grams of protein.   NUTRITION DIAGNOSIS:   Inadequate oral intake related to inability to eat as evidenced by NPO status.  Ongoing   GOAL:   Patient will meet greater than or equal to 90% of their needs  Met with TF  MONITOR:   TF tolerance, Skin, Weight trends, Labs, I & O's  REASON FOR ASSESSMENT:   Consult, Ventilator Enteral/tube feeding initiation and management  ASSESSMENT:   49 year old woman with a history of HIV, COPD, seizure d/o, polysubstance abuse, with new AMS.  Daughter called EMS for suspected overdose, found pill bottles half empty after new prescription filled 08/09/19. To [facility] ED. Benzos and opiates pos on urine tox at 1230.  Given Geodon in ER, developed progressive somnolence > intubated.  Tx to [facility].  Patient continues to receive TF via Cortrak tube: Jevity 1.2 at 60 ml/h providing 1728 kcal, 80 gm protein, 1166 ml free water daily.  SLP following for dysphagia. Diet advanced to dysphagia 2 with thin liquids on 3/22. Meal intakes 10-20% 3/23.  Will change TF to nocturnal regimen since she is eating now.   She does not like Ensure because it causes bloating. Will change supplement to Boost breeze to see if she tolerates that better.   Labs reviewed. K 3.1 (L) CBG's: 168-117  Medications reviewed.  Diet Order:   Diet Order            DIET DYS 2 Room service appropriate? Yes; Fluid consistency: Thin  Diet effective now              EDUCATION NEEDS:   No education needs have been identified at this time  Skin:  Skin Assessment: Reviewed RN Assessment  Last BM:   3/23  Height:   Ht Readings from Last 1 Encounters:  08/15/19 5' (1.524 m)    Weight:   Wt Readings from Last 1 Encounters:  08/23/19 71.3 kg   Admission weight 66 kg  Ideal Body Weight:  45.5 kg  BMI:  Body mass index is 30.7 kg/m.  Estimated Nutritional Needs:   Kcal:  1600-1800  Protein:  80-90 gm  Fluid:  >1.6 L    Molli Barrows, RD, LDN, CNSC Please refer to Amion for contact information.

## 2019-08-25 NOTE — Progress Notes (Signed)
Pt daughter Caryl Pina called to check on mom, get status update and inquire if will be getting discharged tomorrow. Communication sent to MD with patient permission.

## 2019-08-26 DIAGNOSIS — F1910 Other psychoactive substance abuse, uncomplicated: Secondary | ICD-10-CM

## 2019-08-26 LAB — COMPREHENSIVE METABOLIC PANEL
ALT: 60 U/L — ABNORMAL HIGH (ref 0–44)
AST: 20 U/L (ref 15–41)
Albumin: 2.3 g/dL — ABNORMAL LOW (ref 3.5–5.0)
Alkaline Phosphatase: 65 U/L (ref 38–126)
Anion gap: 7 (ref 5–15)
BUN: 13 mg/dL (ref 6–20)
CO2: 22 mmol/L (ref 22–32)
Calcium: 7.7 mg/dL — ABNORMAL LOW (ref 8.9–10.3)
Chloride: 108 mmol/L (ref 98–111)
Creatinine, Ser: 0.81 mg/dL (ref 0.44–1.00)
GFR calc Af Amer: >60 mL/min (ref >60)
GFR calc non Af Amer: >60 mL/min (ref >60)
Glucose, Bld: 110 mg/dL — ABNORMAL HIGH (ref 70–99)
Potassium: 3 mmol/L — ABNORMAL LOW (ref 3.5–5.1)
Sodium: 137 mmol/L (ref 135–145)
Total Bilirubin: 0.7 mg/dL (ref 0.3–1.2)
Total Protein: 4.7 g/dL — ABNORMAL LOW (ref 6.5–8.1)

## 2019-08-26 LAB — CBC
HCT: 28.5 % — ABNORMAL LOW (ref 36.0–46.0)
Hemoglobin: 8.7 g/dL — ABNORMAL LOW (ref 12.0–15.0)
MCH: 27.6 pg (ref 26.0–34.0)
MCHC: 30.5 g/dL (ref 30.0–36.0)
MCV: 90.5 fL (ref 80.0–100.0)
Platelets: 235 10*3/uL (ref 150–400)
RBC: 3.15 MIL/uL — ABNORMAL LOW (ref 3.87–5.11)
RDW: 18.8 % — ABNORMAL HIGH (ref 11.5–15.5)
WBC: 7.2 10*3/uL (ref 4.0–10.5)
nRBC: 0.3 % — ABNORMAL HIGH (ref 0.0–0.2)

## 2019-08-26 LAB — GLUCOSE, CAPILLARY
Glucose-Capillary: 124 mg/dL — ABNORMAL HIGH (ref 70–99)
Glucose-Capillary: 107 mg/dL — ABNORMAL HIGH (ref 70–99)
Glucose-Capillary: 119 mg/dL — ABNORMAL HIGH (ref 70–99)

## 2019-08-26 MED ORDER — DIPHENHYDRAMINE HCL 25 MG PO CAPS
25.0000 mg | ORAL_CAPSULE | Freq: Every evening | ORAL | Status: AC | PRN
  Administered 2019-08-26: 25 mg via ORAL
  Filled 2019-08-26: qty 1

## 2019-08-26 NOTE — Progress Notes (Signed)
New order to discharge patient home.  IV, cardiac monitoring and Cortrack discontinued.  AVS completed, printed and reviewed with patient.  Patient's aunt will transport home.  Patient eager to discharge.

## 2019-08-26 NOTE — TOC Transition Note (Signed)
Transition of Care [facility]) - CM/SW Discharge Note   Patient Details  Name: Elizabeth [name] MRN: [id number] Date of Birth: [birth year]  [facility] CM/SW Contact:  Angelita Ingles, RN Phone Number: 08/26/2019, 2:39 PM   Clinical Narrative:   CM offered choice for home health and home care. Home health set up through [facility]. DME equipment (shower chair and rollator) to be delivered to room by [employer] [facility]. Patient states that she will be going home with daughter who lived at [address] in [place] [address]. Spoke with daughter Elizabeth [name] to verify setup of home care. Daughter is ok with the arrangements. No further needs or concerns at this time.      Final next level of care: [place] Barriers to Discharge: No Barriers Identified   Patient Goals and CMS Choice Patient states their goals for this hospitalization and ongoing recovery are:: To go home with daughter and get better CMS Medicare.gov Compare Post Acute Care list provided to:: Patient Choice offered to / list presented to : Patient  Discharge Placement                       Discharge Plan and Services   Discharge Planning Services: CM Consult            DME Arranged: Tub bench, Walker rolling with seat DME Agency: [employer] Date DME Agency Contacted: 08/26/19 Time DME Agency Contacted: [phone] Representative spoke with at DME Agency: [place]: PT [place]: [place] ([place]) Date [place]: 08/26/19 Time [place]: [phone] Representative spoke with at [place]: [place] ([place]) Interventions     Readmission Risk Interventions Readmission Risk Prevention Plan 08/25/2019  Transportation Screening Complete  Medication Review Press photographer) Referral to Pharmacy  PCP or Specialist appointment within 3-5 days of discharge (No Data)  Palliative Care Screening Not Applicable  Skilled Nursing  Facility Complete  Some recent data might be hidden

## 2019-08-26 NOTE — Discharge Summary (Signed)
Physician Discharge Summary  Elizabeth [name] IDP:[id number] DOB: 1970/11/12 DOA: 08/15/2019  PCP: Edison Pace, MD  Admit date: 08/15/2019 Discharge date: 08/26/2019  Admitted From: Home Disposition: Home with home health  Recommendations for Outpatient Follow-up:  1. Follow up with PCP in 1-2 weeks 2. Please obtain BMP/CBC in one week  Home Health: Physical therapy Equipment/Devices: None  Discharge Condition: Stable CODE STATUS: Full Diet recommendation: As tolerated  Brief/Interim Summary: 49 year old female with history of HIV, COPD, seizure disorder, polysubstance abuse who came into the hospital for suspected unintentional overdose.  Daughter found the patient unresponsive, pill bottles near half empty afternoon prescription filled on 08/09/2019, and called EMS and was brought to the hospital.  She initially presented to [facility], ED, developed progressive somnolence and ended up intubated and transferred to [facility] ICU.  Patient admitted as above with toxic encephalopathy due to unintentional overdose, patient's mental status improved drastically, was extubated on 08/18/2019, mental status continued to improve EEG was negative during this time, MRI brain also showed possible punctate area concerning for anoxic injury but given small size is not expected to be contributory to patient's mental status depression.  Patient's mental status continued to improve, now tolerating p.o. safely, ambulating with some difficulty but improving, PT recommending home health with ongoing physical therapy given ongoing ambulatory dysfunction but otherwise patient's chronic comorbid conditions appear to be quite stable.  No medication change at discharge -recommend patient remain very vigilant about taking additional medications not prescribed to her given her numerous narcotic medications already on her regimen.  Otherwise close follow-up with PCP as indicated in the next 1 to 2 weeks as scheduled.Marland Kitchen  Discharge  Diagnoses:  Active Problems:   Polysubstance overdose   Overdose opiate [facility])  Discharge Instructions  Discharge Instructions    Call MD for:  difficulty breathing, headache or visual disturbances   Complete by: As directed    Call MD for:  extreme fatigue   Complete by: As directed    Call MD for:  hives   Complete by: As directed    Call MD for:  persistant dizziness or light-headedness   Complete by: As directed    Call MD for:  persistant nausea and vomiting   Complete by: As directed    Call MD for:  severe uncontrolled pain   Complete by: As directed    Call MD for:  temperature >100.4   Complete by: As directed    Diet - low sodium heart healthy   Complete by: As directed    Increase activity slowly   Complete by: As directed      Allergies as of 08/26/2019      Reactions   Penicillins Itching, Other (See Comments)   Ibuprofen Other (See Comments)   Morphine    Cariprazine Anxiety   Pt states she has hallucinations and suicidal thoughts when taking this medicine  Pt states she has hallucinations and suicidal thoughts when taking this medicine    Tramadol Rash, Other (See Comments)   headaches      Medication List    STOP taking these medications   HYDROcodone-acetaminophen 5-325 MG tablet Commonly known as: NORCO/VICODIN   omeprazole 20 MG capsule Commonly known as: PRILOSEC     TAKE these medications   albuterol 108 (90 Base) MCG/ACT inhaler Commonly known as: VENTOLIN HFA Inhale 2 puffs into the lungs every 6 (six) hours as needed for wheezing or shortness of breath.   albuterol (2.5 MG/3ML) 0.083% nebulizer solution Commonly  known as: PROVENTIL Take 6 mLs (5 mg total) by nebulization every 6 (six) hours as needed for wheezing or shortness of breath.   atorvastatin 20 MG tablet Commonly known as: LIPITOR Take 20 mg by mouth daily.   divalproex 250 MG DR tablet Commonly known as: DEPAKOTE Take 250-500 mg by mouth 2 (two) times daily. 250 mg in  the am and 500 in the pm   gabapentin 300 MG capsule Commonly known as: NEURONTIN Take 300 mg by mouth 3 (three) times daily.   Genvoya 150-150-200-10 MG Tabs tablet Generic drug: elvitegravir-cobicistat-emtricitabine-tenofovir Take 1 tablet by mouth every morning.   hydrOXYzine 25 MG tablet Commonly known as: ATARAX/VISTARIL Take 1 tablet by mouth 3 (three) times daily as needed.   ipratropium-albuterol 0.5-2.5 (3) MG/3ML Soln Commonly known as: DUONEB Inhale 3 mLs into the lungs every 6 (six) hours as needed (for shortness of breath-wheezing).   levETIRAcetam 750 MG tablet Commonly known as: KEPPRA Take 750 mg by mouth 2 (two) times daily.   oxyCODONE-acetaminophen 5-325 MG tablet Commonly known as: PERCOCET/ROXICET Take 1 tablet by mouth 4 (four) times daily as needed for moderate pain.   pantoprazole 40 MG tablet Commonly known as: PROTONIX Take 40 mg by mouth 2 (two) times daily.   pregabalin 75 MG capsule Commonly known as: LYRICA Take 1 capsule by mouth 3 (three) times daily.   rOPINIRole 1 MG tablet Commonly known as: REQUIP Take 1 mg by mouth at bedtime.   Spiriva HandiHaler 18 MCG inhalation capsule Generic drug: tiotropium Place 18 mcg into inhaler and inhale daily.   sucralfate 1 g tablet Commonly known as: CARAFATE Take 1 g by mouth 4 (four) times daily.   Symbicort 160-4.5 MCG/ACT inhaler Generic drug: budesonide-formoterol Inhale 2 puffs into the lungs 2 (two) times daily.   venlafaxine 37.5 MG tablet Commonly known as: EFFEXOR Take 1 tablet by mouth 2 (two) times a day.   Vitamin D (Ergocalciferol) 1.25 MG (50000 UNIT) Caps capsule Commonly known as: DRISDOL Take 1 capsule by mouth once a week.   Xtampza ER 13.5 MG C12a Generic drug: oxyCODONE ER Take 13.5 mg by mouth daily as needed (pain).       Allergies  Allergen Reactions  . Penicillins Itching and Other (See Comments)  . Ibuprofen Other (See Comments)  . Morphine   .  Cariprazine Anxiety    Pt states she has hallucinations and suicidal thoughts when taking this medicine  Pt states she has hallucinations and suicidal thoughts when taking this medicine    . Tramadol Rash and Other (See Comments)    headaches     Consultations:  PCCM   Procedures/Studies: DG Chest 2 View  Result Date: 08/13/2019 CLINICAL DATA:  Mid chest pain. Shortness of breath. Weakness. EXAM: CHEST - 2 VIEW COMPARISON:  Radiograph 03/28/2018. CT size size 15 no interval cross-sectional imaging. FINDINGS: Postsurgical change in the right hemithorax. Associated postsurgical volume loss. Right hilar prominence which is likely vascular. Upper normal heart size. No acute airspace disease. No pleural effusion or pneumothorax. No acute osseous abnormalities are seen. IMPRESSION: 1. Upper normal heart size.  No acute pulmonary process. 2. Postsurgical change in the right hemithorax. Electronically Signed   By: Keith Rake M.D.   On: 08/13/2019 17:57   CT Head Wo Contrast  Result Date: 08/15/2019 CLINICAL DATA:  Mental status change. Drug overdose. Personal history of lung cancer, HIV, and seizures. EXAM: CT HEAD WITHOUT CONTRAST TECHNIQUE: Contiguous axial images were obtained from the  base of the skull through the vertex without intravenous contrast. COMPARISON:  CT head without contrast 03/28/2018 FINDINGS: Brain: No acute infarct, hemorrhage, or mass lesion is present. The ventricles are of normal size. No significant extraaxial fluid collection is present. The brainstem and cerebellum are within normal limits. Vascular: Atherosclerotic calcifications are present within the cavernous internal carotid arteries. No hyperdense vessel is present. Skull: Calvarium is intact. No focal lytic or blastic lesions are present. No significant extracranial soft tissue lesion is present. Sinuses/Orbits: Extensive soft tissue prominence is present throughout the nasal cavity. Chronic wall thickening is  present in the right maxillary sinus. There is some mucosal thickening throughout the ethmoid air cells. Frontal sinuses and sphenoid sinuses are clear. Mastoid air cells are clear. The globes and orbits are within normal limits. IMPRESSION: 1. Normal CT appearance of the brain. 2. Extensive soft tissue prominence within the nasal cavity may represent polyps or diffuse nasal mucosal swelling. 3. Chronic right maxillary sinus disease. Electronically Signed   By: San Morelle M.D.   On: 08/15/2019 15:48   MR BRAIN WO CONTRAST  Result Date: 08/21/2019 CLINICAL DATA:  Encephalopathy, possible overdose EXAM: MRI HEAD WITHOUT CONTRAST TECHNIQUE: Multiplanar, multiecho pulse sequences of the brain and surrounding structures were obtained without intravenous contrast. COMPARISON:  None. FINDINGS: Motion artifact is present. Brain: Punctate focus of diffusion and T2 hyperintensity in the region of the right globus pallidus. There is no acute infarction or intracranial hemorrhage. There is no intracranial mass, mass effect, or edema. There is no hydrocephalus or extra-axial fluid collection. Vascular: Major vessel flow voids at the skull base are preserved. Skull and upper cervical spine: Normal marrow signal is preserved. Sinuses/Orbits: Paranasal sinuses are aerated. Orbits are unremarkable. Other: Sella is unremarkable.  Mastoid air cells are clear. IMPRESSION: Punctate focus of abnormal signal in the region of the right globus pallidus likely reflects hypoxic/anoxic injury. Electronically Signed   By: Macy Mis M.D.   On: 08/21/2019 19:47   DG Chest Port 1 View  Result Date: 08/22/2019 CLINICAL DATA:  Acute respiratory failure. EXAM: PORTABLE CHEST 1 VIEW COMPARISON:  Chest radiograph 08/19/2019 FINDINGS: An enteric tube passes below the level of the left hemidiaphragm with tip excluded from the field of view. The cardiomediastinal silhouette is unchanged. Redemonstrated postoperative changes to the  perihilar right lung. Again demonstrated are bilateral basal predominant interstitial and ill-defined airspace opacities. Persistent small bilateral pleural effusions. No evidence of pneumothorax. No acute bony abnormality. IMPRESSION: An enteric tube passes below the level of the left hemidiaphragm with tip excluded from the field of view. Similar appearance of bilateral basal predominant interstitial and ill-defined airspace opacities with small bilateral pleural effusions as compared to 08/19/2019. Electronically Signed   By: Kellie Simmering DO   On: 08/22/2019 07:20   DG Chest Port 1 View  Result Date: 08/19/2019 CLINICAL DATA:  Respiratory failure EXAM: PORTABLE CHEST 1 VIEW COMPARISON:  08/18/2019 FINDINGS: Interval endotracheal and esophagogastric extubation. No significant change in bilateral, bibasilar predominant heterogeneous and interstitial airspace opacity and small bilateral pleural effusions. Postoperative findings about the perihilar right lung. Cardiomegaly. IMPRESSION: 1. Interval extubation. 2. No significant change in bilateral heterogeneous and interstitial airspace opacity and small bilateral pleural effusions. Electronically Signed   By: Eddie Candle M.D.   On: 08/19/2019 09:26   DG CHEST PORT 1 VIEW  Result Date: 08/18/2019 CLINICAL DATA:  Acute respiratory failure with hypoxia EXAM: PORTABLE CHEST 1 VIEW COMPARISON:  Two days ago FINDINGS: Worsening infiltrates in  the lower lungs with small bilateral pleural effusion. Normal heart size for technique. Postoperative right lung. Endotracheal tube with tip 17 mm above the carina. The enteric tube reaches the stomach. IMPRESSION: Worsening bilateral infiltrates. Electronically Signed   By: Monte Fantasia M.D.   On: 08/18/2019 07:23   DG CHEST PORT 1 VIEW  Result Date: 08/16/2019 CLINICAL DATA:  Hypoxia,resp failure ,ETT present EXAM: PORTABLE CHEST - 1 VIEW COMPARISON:  the previous day's study FINDINGS: Endotracheal tube, nasogastric  tube stable position. Postop changes in the right lung. Relatively low lung volumes. No definite infiltrate or overt edema. Heart size and mediastinal contours are within normal limits. No pneumothorax. No effusion. Visualized bones unremarkable. IMPRESSION: Low lung volumes. No acute disease. Electronically Signed   By: Lucrezia Europe M.D.   On: 08/16/2019 10:49   DG Chest Portable 1 View  Result Date: 08/15/2019 CLINICAL DATA:  Endotracheal tube and orogastric tube placement. EXAM: PORTABLE CHEST 1 VIEW COMPARISON:  August 13, 2019 FINDINGS: An endotracheal tube is seen with its distal tip approximately 1.7 cm from the carina. A nasogastric tube is noted with its distal tip overlying the expected region of the gastric antrum. Radiopaque surgical clips are again seen overlying the lateral aspect of the upper right lung. Additional postoperative changes seen overlying the mid right lung. There is no evidence of acute infiltrate, pleural effusion or pneumothorax. The heart size and mediastinal contours are within normal limits. The visualized skeletal structures are unremarkable. IMPRESSION: 1. Endotracheal tube and nasogastric tube in good position. 2. No acute cardiopulmonary disease. Electronically Signed   By: Virgina Norfolk M.D.   On: 08/15/2019 15:17   DG Swallowing Func-Speech Pathology  Result Date: 08/23/2019 Objective Swallowing Evaluation: Type of Study: MBS-Modified Barium Swallow Study  Patient Details Name: Elizabeth [name] MRN: [id number] Date of Birth: 12-Jun-1970 Today's Date: 08/23/2019 Time: SLP Start Time (ACUTE ONLY): 1342 -SLP Stop Time (ACUTE ONLY): 1403 SLP Time Calculation (min) (ACUTE ONLY): 21 min Past Medical History: Past Medical History: Diagnosis Date . Asthma   uses Albuterol inhaler daily as needed . COPD (chronic obstructive pulmonary disease) ([place])  . Depression   takes Trazodone and Seroquel nightly . Dizziness  . Dysphagia  . Headache(784.0)  . History of migraine   last one about  2wks ago . HIV (human immunodeficiency virus infection) ([place])   takes Stribild daily . Lung cancer ([place])  . Nocturia  . Seizures ([place])   takes Depakote daily-last one about 6 months ago as of 01/02/19 . Shortness of breath   sitting/lying/exertion . Substance abuse ([place])  . Urinary frequency  Past Surgical History: Past Surgical History: Procedure Laterality Date . BREAST SURGERY Left   mass removed from left breast-fatty tissue . CESAREAN SECTION  74yr ago . VIDEO ASSISTED THORACOSCOPY (VATS)/WEDGE RESECTION Right 07/05/2013  Procedure: VIDEO ASSISTED THORACOSCOPY (VATS)/WEDGE RESECTION, Right Upper Lobectomy with lymph node disecction and OnQ placement;  Surgeon: SMelrose Nakayama MD;  [facility] [facility]  Service: Thoracic;  Laterality: Right;  (R)VATS, WEDGE RESECTION, POSSIBLE LOBECTOMY, POSSIBLE CHEST WALL RESECTION . VIDEO BRONCHOSCOPY Bilateral 05/05/2013  Procedure: VIDEO BRONCHOSCOPY WITH FLUORO;  Surgeon: MTanda Rockers MD;  Location: [facility];  Service: Cardiopulmonary;  Laterality: Bilateral; HPI: 49year old woman with a history of HIV, COPD, seizure d/o, polysubstance abuse, with new AMS. Daughter called EMS for suspected overdose, found pill bottles half empty after new prescription filled 3/8/2Benzos and opiates pos on urine tox. Intubated 3/14-3/17. Acute Hypoxemic Respiratory Insufficiency - multifactorial  in setting AMS, suspected Aspiration Pneumonitis with associated Bronchospasm; acute metabloic encephalopathy, AKI secondary to rhabdomyolosis, acute liver failure.  No data recorded Assessment / Plan / Recommendation CHL IP CLINICAL IMPRESSIONS 08/23/2019 Clinical Impression Pt exhibited mild oropharyngeal impairments marked by laryngeal penetration with thin due to decreased timing of laryngeal protection. She has dentures at home however last week daughter reported she never wears them when eating. She exhibited prolonged mastication, bolus formation and transit with solid texture.  Pharyngeal phase marked by minimally delayed epiglottic deflection with thin barium resulting in flash, momentary penetration during swallow. At end of study, she regurgitated mild amount to pharynx and trace amount was penetrated onto her vocal cords. Thin liquids via cup or straw with small sips was safe during study. Recommend Dys 2 texture with suspected progression soon to higher texture as endurance continues to improve. Small sips with cup or straw allowed, meds whole in puree and full assist with feeding and strategies.       SLP Visit Diagnosis Dysphagia, oropharyngeal phase (R13.12) Attention and concentration deficit following -- Frontal lobe and executive function deficit following -- Impact on safety and function Mild aspiration risk;Moderate aspiration risk   CHL IP TREATMENT RECOMMENDATION 08/23/2019 Treatment Recommendations Therapy as outlined in treatment plan below   Prognosis 08/23/2019 Prognosis for Safe Diet Advancement Good Barriers to Reach Goals Cognitive deficits Barriers/Prognosis Comment -- CHL IP DIET RECOMMENDATION 08/23/2019 SLP Diet Recommendations Dysphagia 2 (Fine chop) solids;Thin liquid Liquid Administration via Cup;Straw Medication Administration Whole meds with puree Compensations Minimize environmental distractions;Slow rate;Small sips/bites;Clear throat intermittently Postural Changes Seated upright at 90 degrees   CHL IP OTHER RECOMMENDATIONS 08/23/2019 Recommended Consults -- Oral Care Recommendations Oral care BID Other Recommendations --   CHL IP FOLLOW UP RECOMMENDATIONS 08/23/2019 Follow up Recommendations 24 hour supervision/assistance   CHL IP FREQUENCY AND DURATION 08/23/2019 Speech Therapy Frequency (ACUTE ONLY) min 2x/week Treatment Duration 2 weeks      CHL IP ORAL PHASE 08/23/2019 Oral Phase Impaired Oral - Pudding Teaspoon -- Oral - Pudding Cup -- Oral - Honey Teaspoon -- Oral - Honey Cup -- Oral - Nectar Teaspoon -- Oral - Nectar Cup Lingual/palatal residue Oral -  Nectar Straw WFL Oral - Thin Teaspoon -- Oral - Thin Cup Lingual/palatal residue Oral - Thin Straw Lingual/palatal residue Oral - Puree Delayed oral transit Oral - Mech Soft -- Oral - Regular Other (Comment) Oral - Multi-Consistency -- Oral - Pill -- Oral Phase - Comment --  CHL IP PHARYNGEAL PHASE 08/23/2019 Pharyngeal Phase Impaired Pharyngeal- Pudding Teaspoon -- Pharyngeal -- Pharyngeal- Pudding Cup -- Pharyngeal -- Pharyngeal- Honey Teaspoon -- Pharyngeal -- Pharyngeal- Honey Cup -- Pharyngeal -- Pharyngeal- Nectar Teaspoon -- Pharyngeal -- Pharyngeal- Nectar Cup WFL Pharyngeal -- Pharyngeal- Nectar Straw WFL Pharyngeal -- Pharyngeal- Thin Teaspoon -- Pharyngeal -- Pharyngeal- Thin Cup [facility] Pharyngeal Material does not enter airway Pharyngeal- Thin Straw Penetration/Aspiration during swallow Pharyngeal Material enters airway, remains ABOVE vocal cords then ejected out;Material enters airway, CONTACTS cords and then ejected out;Material enters airway, CONTACTS cords and not ejected out Pharyngeal- Puree WFL Pharyngeal -- Pharyngeal- Mechanical Soft -- Pharyngeal -- Pharyngeal- Regular WFL Pharyngeal -- Pharyngeal- Multi-consistency -- Pharyngeal -- Pharyngeal- Pill -- Pharyngeal -- Pharyngeal Comment --  CHL IP CERVICAL ESOPHAGEAL PHASE 08/23/2019 Cervical Esophageal Phase WFL Pudding Teaspoon -- Pudding Cup -- Honey Teaspoon -- Honey Cup -- Nectar Teaspoon -- Nectar Cup -- Nectar Straw -- Thin Teaspoon -- Thin Cup -- Thin Straw -- Puree -- Mechanical Soft -- Regular --  Multi-consistency -- Pill -- Cervical Esophageal Comment -- [place] Siren 08/23/2019, 4:31 PM Orbie Pyo Colvin Caroli.Ed Actor Pager [phone] Office [phone]              EEG adult  Result Date: 08/20/2019 Lora Havens, MD     08/20/2019  2:29 PM Patient Name: Elizabeth [name] MRN: [id number] Epilepsy Attending: Lora Havens Referring Physician/Provider: Dr. Dyann Ruddle Date: 08/20/2019 Duration: 24.52 minutes  Patient history: 49 year old female with history of seizures due to polysubstance abuse presented with altered mental status.  EEG to evaluate for seizures. Level of alertness: Lethargic AEDs during EEG study: Keppra, Ativan Technical aspects: This EEG study was done with scalp electrodes positioned according to the 10-20 International system of electrode placement. Electrical activity was acquired at a sampling rate of 500Hz  and reviewed with a high frequency filter of 70Hz  and a low frequency filter of 1Hz . EEG data were recorded continuously and digitally stored. Description: EEG showed continuous generalized 3 to 7 Hz theta-delta slowing as well as intermittent 2 to 3-second.'s of generalized EEG attenuation.  Hyperventilation and photic stimulation were not performed. Abnormality -Continuous slow, generalized -Background attenuation, generalized IMPRESSION: This study is suggestive of severe diffuse encephalopathy, nonspecific etiology. No seizures or epileptiform discharges were seen throughout the recording. Lora Havens   ECHOCARDIOGRAM COMPLETE  Result Date: 08/16/2019    ECHOCARDIOGRAM REPORT   Patient Name:   Elizabeth [name] Date of Exam: 08/16/2019 Medical Rec #:  [id number]   Height:       60.0 in Accession #:    [id number]  Weight:       145.5 lb Date of Birth:  22-Jul-1970   BSA:          1.631 m Patient Age:    87 years    BP:           114/87 mmHg Patient Gender: F           HR:           99 bpm. Exam Location:  Inpatient Procedure: 2D Echo, Cardiac Doppler and Color Doppler Indications:    Acute Respiratory Insufficiency 518.82  History:        Patient has no prior history of Echocardiogram examinations.                 COPD, Signs/Symptoms:Dyspnea; Risk Factors:Current Smoker.  Sonographer:    Vickie Epley RDCS Referring Phys: [id number] [facility]  Sonographer Comments: Echo performed with patient supine and on artificial respirator. IMPRESSIONS  1. Endocardial border defnition is poor.  Systolic function appears normal.. Left ventricular ejection fraction, by estimation, is 55 to 60%. The left ventricle has normal function. The left ventricle has no regional wall motion abnormalities. Left ventricular diastolic parameters were normal.  2. Right ventricular systolic function is normal. The right ventricular size is normal. There is moderately elevated pulmonary artery systolic pressure.  3. The mitral valve is normal in structure. Trivial mitral valve regurgitation. No evidence of mitral stenosis.  4. The aortic valve is tricuspid. Aortic valve regurgitation is not visualized. No aortic stenosis is present.  5. The inferior vena cava is dilated in size with <50% respiratory variability, suggesting right atrial pressure of 15 mmHg. FINDINGS  Left Ventricle: Endocardial border defnition is poor. Systolic function appears normal. Left ventricular ejection fraction, by estimation, is 55 to 60%. The left ventricle has normal function. The left ventricle has no regional wall motion abnormalities. The left ventricular internal cavity  size was normal in size. There is no left ventricular hypertrophy. Left ventricular diastolic parameters were normal. Right Ventricle: The right ventricular size is normal. No increase in right ventricular wall thickness. Right ventricular systolic function is normal. There is moderately elevated pulmonary artery systolic pressure. The tricuspid regurgitant velocity is 2.81 m/s, and with an assumed right atrial pressure of 15 mmHg, the estimated right ventricular systolic pressure is 70.1 mmHg. Left Atrium: Left atrial size was normal in size. Right Atrium: Right atrial size was normal in size. Pericardium: There is no evidence of pericardial effusion. Mitral Valve: The mitral valve is normal in structure. Normal mobility of the mitral valve leaflets. Trivial mitral valve regurgitation. No evidence of mitral valve stenosis. Tricuspid Valve: The tricuspid valve is normal in  structure. Tricuspid valve regurgitation is mild . No evidence of tricuspid stenosis. Aortic Valve: The aortic valve is tricuspid. Aortic valve regurgitation is not visualized. No aortic stenosis is present. Pulmonic Valve: The pulmonic valve was normal in structure. Pulmonic valve regurgitation is not visualized. No evidence of pulmonic stenosis. Aorta: The aortic root is normal in size and structure. Venous: The inferior vena cava is dilated in size with less than 50% respiratory variability, suggesting right atrial pressure of 15 mmHg. IAS/Shunts: No atrial level shunt detected by color flow Doppler. Additional Comments: A is visualized in the inferior vena cava.  LEFT VENTRICLE PLAX 2D LVIDd:         4.30 cm     Diastology LVIDs:         3.40 cm     LV e' lateral:   14.30 cm/s LV PW:         0.80 cm     LV E/e' lateral: 4.9 LV IVS:        0.80 cm     LV e' medial:    9.79 cm/s LVOT diam:     2.10 cm     LV E/e' medial:  7.2 LV SV:         78 LV SV Index:   48 LVOT Area:     3.46 cm  LV Volumes (MOD) LV vol d, MOD A2C: 89.0 ml LV vol d, MOD A4C: 73.2 ml LV vol s, MOD A2C: 40.7 ml LV vol s, MOD A4C: 42.8 ml LV SV MOD A2C:     48.3 ml LV SV MOD A4C:     73.2 ml LV SV MOD BP:      39.5 ml RIGHT VENTRICLE RV S prime:     16.60 cm/s TAPSE (M-mode): 1.8 cm LEFT ATRIUM             Index       RIGHT ATRIUM           Index LA diam:        3.00 cm 1.84 cm/m  RA Area:     14.00 cm LA Vol (A2C):   28.9 ml 17.72 ml/m RA Volume:   33.10 ml  20.30 ml/m LA Vol (A4C):   22.9 ml 14.04 ml/m LA Biplane Vol: 26.2 ml 16.07 ml/m  AORTIC VALVE LVOT Vmax:   132.00 cm/s LVOT Vmean:  89.500 cm/s LVOT VTI:    0.225 m  AORTA Ao Root diam: 2.60 cm MITRAL VALVE               TRICUSPID VALVE MV Area (PHT): 3.93 cm    TR Peak grad:   31.6 mmHg MV Decel Time: 193 msec  TR Vmax:        281.00 cm/s MR Peak grad: 77.8 mmHg MR Vmax:      441.00 cm/s  SHUNTS MV E velocity: 70.70 cm/s  Systemic VTI:  0.22 m MV A velocity: 72.80 cm/s  Systemic  Diam: 2.10 cm MV E/A ratio:  0.97 Skeet Latch MD Electronically signed by Skeet Latch MD Signature Date/Time: 08/16/2019/2:14:43 PM    Final    US LIVER DOPPLER  Result Date: 08/16/2019 CLINICAL DATA:  Transaminitis EXAM: DUPLEX ULTRASOUND OF LIVER TECHNIQUE: Color and duplex Doppler ultrasound was performed to evaluate the hepatic in-flow and out-flow vessels. COMPARISON:  PET-CT 05/19/2013 FINDINGS: Portal Vein 1.3 cm diameter. No occlusion or thrombus. Velocities (all hepatopetal): Main:  39-46 cm/sec Right:  31 cm/sec Left:  34 cm/sec Hepatic Vein Velocities (all hepatofugal): Right:  11 cm/sec Middle:  15 cm/sec Left:  11 cm/sec SMV: 23 cm/sec Intrahepatic IVC patent, velocity: 15 cm/sec Hepatic Artery Velocity:  67 cm/sec Splenic Vein Velocity:  34 cm/sec.  No occlusion or thrombus. Spleen 16.1 x 4.2 x 5 cm (volume = 200 cm^3). Varices: None identified Ascites: None No liver lesion identified.  No biliary ductal dilatation. Gallbladder wall thickening up to 0.7 cm with trace pericholecystic fluid. Common bile duct is nondilated measuring 4 mm diameter. IMPRESSION: 1. Unremarkable hepatic vascular Doppler evaluation. 2. Nonspecific gallbladder wall thickening with trace pericholecystic fluid. Electronically Signed   By: Lucrezia Europe M.D.   On: 08/16/2019 12:32    Subjective: No acute issues or events overnight, requesting discharge home which is certainly reasonable given resolution of symptoms, improvement in mental status and ability to take p.o. safely.   Discharge Exam: Vitals:   08/26/19 0750 08/26/19 0830  BP:  (!) 142/88  Pulse: 80 79  Resp: 18 16  Temp:  (!) 100.5 F (38.1 C)  SpO2: 93% 96%   Vitals:   08/25/19 2312 08/26/19 0427 08/26/19 0750 08/26/19 0830  BP: 113/75 116/80  (!) 142/88  Pulse: 75 80 80 79  Resp: 20 18 18 16   Temp: 98.8 F (37.1 C) 98.9 F (37.2 C)  (!) 100.5 F (38.1 C)  TempSrc: Oral Oral    SpO2: 96% 93% 93% 96%  Weight:      Height:         General:  Pleasantly resting in bed, No acute distress. HEENT:  Normocephalic atraumatic.  Sclerae nonicteric, noninjected.  Extraocular movements intact bilaterally. Neck:  Without mass or deformity.  Trachea is midline. Lungs:  Clear to auscultate bilaterally without rhonchi, wheeze, or rales. Heart:  Regular rate and rhythm.  Without murmurs, rubs, or gallops. Abdomen:  Soft, nontender, nondistended.  Without guarding or rebound. Extremities: Without cyanosis, clubbing, edema, or obvious deformity. Vascular:  Dorsalis pedis and posterior tibial pulses palpable bilaterally. Skin:  Warm and dry, no erythema, no ulcerations.   The results of significant diagnostics from this hospitalization (including imaging, microbiology, ancillary and laboratory) are listed below for reference.     Microbiology: No results found for this or any previous visit (from the past 240 hour(s)).   Labs: BNP (last 3 results) Recent Labs    08/13/19 1857  BNP 629.5*   Basic Metabolic Panel: Recent Labs  Lab 08/19/19 1700 08/20/19 0007 08/20/19 2841 08/20/19 1831 08/21/19 0057 08/22/19 1616 08/22/19 1616 08/23/19 0323 08/23/19 1426 08/24/19 0301 08/24/19 0853 08/25/19 0257 08/25/19 0826 08/25/19 1424 08/25/19 2050 08/26/19 0217  NA  --    < >  --   --    < >  153*   < > 154*   < > 147*   < > 141 143 143 141 137  K  --    < >  --   --    < > 4.0  --  3.1*  --  3.2*  --  3.1*  --   --   --  3.0*  CL  --   --   --   --    < > 121*  --  120*  --  116*  --  110  --   --   --  108  CO2  --   --   --   --    < > 23  --  23  --  24  --  24  --   --   --  22  GLUCOSE  --   --   --   --    < > 277*  --  150*  --  123*  --  106*  --   --   --  110*  BUN  --   --   --   --    < > 42*  --  34*  --  27*  --  18  --   --   --  13  CREATININE  --   --   --   --    < > 1.07*  --  0.91  --  1.07*  --  1.01*  --   --   --  0.81  CALCIUM  --   --   --   --    < > 8.9  --  8.8*  --  8.4*  --  8.2*  --   --    --  7.7*  MG 2.2  --  2.2 2.3  --   --   --   --   --   --   --   --   --   --   --   --   PHOS 3.9  --  3.7 3.3  --   --   --   --   --   --   --   --   --   --   --   --    < > = values in this interval not displayed.   Liver Function Tests: Recent Labs  Lab 08/22/19 0121 08/23/19 0323 08/24/19 0301 08/25/19 0257 08/26/19 0217  AST 19 28 22 21 20   ALT 199* 155* 97* 75* 60*  ALKPHOS 91 89 70 63 65  BILITOT 0.7 0.6 0.8 0.7 0.7  PROT 5.0* 5.4* 4.8* 4.9* 4.7*  ALBUMIN 2.5* 2.7* 2.4* 2.4* 2.3*   No results for input(s): LIPASE, AMYLASE in the last 168 hours. No results for input(s): AMMONIA in the last 168 hours. CBC: Recent Labs  Lab 08/22/19 0121 08/23/19 0323 08/24/19 0301 08/25/19 0257 08/26/19 0217  WBC 7.2 12.4* 10.1 9.5 7.2  HGB 9.8* 10.2* 8.6* 8.7* 8.7*  HCT 32.5* 34.1* 28.6* 28.4* 28.5*  MCV 90.3 90.9 91.1 90.2 90.5  PLT 474* 484* 356 300 235   Cardiac Enzymes: Recent Labs  Lab 08/19/19 1700 08/20/19 0628  CKTOTAL 102 52   BNP: Invalid input(s): POCBNP CBG: Recent Labs  Lab 08/25/19 2230 08/25/19 2356 08/26/19 0417 08/26/19 0828 08/26/19 1206  GLUCAP 170* 196* 124* 107* 119*   D-Dimer No results for input(s): DDIMER in the last 72 hours. Hgb  A1c No results for input(s): HGBA1C in the last 72 hours. Lipid Profile No results for input(s): CHOL, HDL, LDLCALC, TRIG, CHOLHDL, LDLDIRECT in the last 72 hours. Thyroid function studies No results for input(s): TSH, T4TOTAL, T3FREE, THYROIDAB in the last 72 hours.  Invalid input(s): FREET3 Anemia work up No results for input(s): VITAMINB12, FOLATE, FERRITIN, TIBC, IRON, RETICCTPCT in the last 72 hours. Urinalysis    Component Value Date/Time   COLORURINE AMBER (A) 08/15/2019 1230   APPEARANCEUR CLOUDY (A) 08/15/2019 1230   LABSPEC 1.015 08/15/2019 1230   PHURINE 5.0 08/15/2019 1230   GLUCOSEU NEGATIVE 08/15/2019 1230   GLUCOSEU NEG mg/dL 11/15/2009 1840   HGBUR LARGE (A) 08/15/2019 1230    BILIRUBINUR NEGATIVE 08/15/2019 1230   KETONESUR NEGATIVE 08/15/2019 1230   PROTEINUR 30 (A) 08/15/2019 1230   UROBILINOGEN 1.0 07/08/2013 1210   NITRITE NEGATIVE 08/15/2019 1230   LEUKOCYTESUR NEGATIVE 08/15/2019 1230   Sepsis Labs Invalid input(s): PROCALCITONIN,  WBC,  LACTICIDVEN Microbiology No results found for this or any previous visit (from the past 240 hour(s)).   Time coordinating discharge: Over 30 minutes  SIGNED:   Little Ishikawa, DO [facility] 08/26/2019, 1:30 PM

## 2019-08-29 ENCOUNTER — Emergency Department (HOSPITAL_COMMUNITY): Payer: Medicaid Other

## 2019-08-29 ENCOUNTER — Encounter (HOSPITAL_COMMUNITY): Payer: Self-pay | Admitting: Emergency Medicine

## 2019-08-29 ENCOUNTER — Observation Stay (HOSPITAL_COMMUNITY)
Admission: EM | Admit: 2019-08-29 | Discharge: 2019-08-31 | Disposition: A | Discharge status: Home / Self Care | Payer: Medicaid Other | Attending: Internal Medicine | Admitting: Internal Medicine

## 2019-08-29 ENCOUNTER — Other Ambulatory Visit: Payer: Self-pay

## 2019-08-29 ENCOUNTER — Encounter (HOSPITAL_COMMUNITY): Payer: Self-pay

## 2019-08-29 ENCOUNTER — Emergency Department (HOSPITAL_COMMUNITY)
Admission: EM | Admit: 2019-08-29 | Discharge: 2019-08-29 | Discharge status: Against Medical Advice | Payer: Medicaid Other | Source: Home / Self Care | Attending: Emergency Medicine | Admitting: Emergency Medicine

## 2019-08-29 DIAGNOSIS — Z880 Allergy status to penicillin: Secondary | ICD-10-CM | POA: Diagnosis not present

## 2019-08-29 DIAGNOSIS — F329 Major depressive disorder, single episode, unspecified: Secondary | ICD-10-CM | POA: Diagnosis not present

## 2019-08-29 DIAGNOSIS — B20 Human immunodeficiency virus [HIV] disease: Secondary | ICD-10-CM | POA: Diagnosis present

## 2019-08-29 DIAGNOSIS — R197 Diarrhea, unspecified: Secondary | ICD-10-CM

## 2019-08-29 DIAGNOSIS — Z79899 Other long term (current) drug therapy: Secondary | ICD-10-CM | POA: Insufficient documentation

## 2019-08-29 DIAGNOSIS — C3490 Malignant neoplasm of unspecified part of unspecified bronchus or lung: Secondary | ICD-10-CM | POA: Diagnosis not present

## 2019-08-29 DIAGNOSIS — A419 Sepsis, unspecified organism: Secondary | ICD-10-CM | POA: Insufficient documentation

## 2019-08-29 DIAGNOSIS — J449 Chronic obstructive pulmonary disease, unspecified: Secondary | ICD-10-CM | POA: Insufficient documentation

## 2019-08-29 DIAGNOSIS — E876 Hypokalemia: Principal | ICD-10-CM | POA: Insufficient documentation

## 2019-08-29 DIAGNOSIS — R112 Nausea with vomiting, unspecified: Secondary | ICD-10-CM | POA: Insufficient documentation

## 2019-08-29 DIAGNOSIS — F32A Depression, unspecified: Secondary | ICD-10-CM | POA: Diagnosis present

## 2019-08-29 DIAGNOSIS — G40909 Epilepsy, unspecified, not intractable, without status epilepticus: Secondary | ICD-10-CM | POA: Insufficient documentation

## 2019-08-29 DIAGNOSIS — R0602 Shortness of breath: Secondary | ICD-10-CM | POA: Diagnosis not present

## 2019-08-29 DIAGNOSIS — Z20822 Contact with and (suspected) exposure to covid-19: Secondary | ICD-10-CM | POA: Insufficient documentation

## 2019-08-29 DIAGNOSIS — R509 Fever, unspecified: Secondary | ICD-10-CM

## 2019-08-29 DIAGNOSIS — F17210 Nicotine dependence, cigarettes, uncomplicated: Secondary | ICD-10-CM | POA: Insufficient documentation

## 2019-08-29 DIAGNOSIS — G9340 Encephalopathy, unspecified: Secondary | ICD-10-CM | POA: Insufficient documentation

## 2019-08-29 DIAGNOSIS — F1113 Opioid abuse with withdrawal: Secondary | ICD-10-CM | POA: Diagnosis not present

## 2019-08-29 DIAGNOSIS — Z885 Allergy status to narcotic agent status: Secondary | ICD-10-CM | POA: Insufficient documentation

## 2019-08-29 DIAGNOSIS — F1910 Other psychoactive substance abuse, uncomplicated: Secondary | ICD-10-CM | POA: Diagnosis present

## 2019-08-29 DIAGNOSIS — A09 Infectious gastroenteritis and colitis, unspecified: Secondary | ICD-10-CM

## 2019-08-29 LAB — CBC WITH DIFFERENTIAL/PLATELET
Abs Immature Granulocytes: 0.24 10*3/uL — ABNORMAL HIGH (ref 0.00–0.07)
Basophils Absolute: 0 10*3/uL (ref 0.0–0.1)
Basophils Relative: 0 %
Eosinophils Absolute: 0 10*3/uL (ref 0.0–0.5)
Eosinophils Relative: 0 %
HCT: 34.8 % — ABNORMAL LOW (ref 36.0–46.0)
Hemoglobin: 10.3 g/dL — ABNORMAL LOW (ref 12.0–15.0)
Immature Granulocytes: 2 %
Lymphocytes Relative: 19 %
Lymphs Abs: 2.3 10*3/uL (ref 0.7–4.0)
MCH: 27.7 pg (ref 26.0–34.0)
MCHC: 29.6 g/dL — ABNORMAL LOW (ref 30.0–36.0)
MCV: 93.5 fL (ref 80.0–100.0)
Monocytes Absolute: 0.9 10*3/uL (ref 0.1–1.0)
Monocytes Relative: 8 %
Neutro Abs: 8.5 10*3/uL — ABNORMAL HIGH (ref 1.7–7.7)
Neutrophils Relative %: 71 %
Platelets: 310 10*3/uL (ref 150–400)
RBC: 3.72 MIL/uL — ABNORMAL LOW (ref 3.87–5.11)
RDW: 18.8 % — ABNORMAL HIGH (ref 11.5–15.5)
WBC: 12 10*3/uL — ABNORMAL HIGH (ref 4.0–10.5)
nRBC: 0 % (ref 0.0–0.2)

## 2019-08-29 LAB — URINALYSIS, ROUTINE W REFLEX MICROSCOPIC
Bacteria, UA: NONE SEEN
Bilirubin Urine: NEGATIVE
Glucose, UA: NEGATIVE mg/dL
Ketones, ur: NEGATIVE mg/dL
Leukocytes,Ua: NEGATIVE
Nitrite: NEGATIVE
Protein, ur: NEGATIVE mg/dL
Specific Gravity, Urine: 1.009 (ref 1.005–1.030)
pH: 6 (ref 5.0–8.0)

## 2019-08-29 LAB — PROTIME-INR
INR: 1.2 (ref 0.8–1.2)
Prothrombin Time: 15 seconds (ref 11.4–15.2)

## 2019-08-29 LAB — LACTIC ACID, PLASMA: Lactic Acid, Venous: 0.8 mmol/L (ref 0.5–1.9)

## 2019-08-29 LAB — COMPREHENSIVE METABOLIC PANEL
ALT: 43 U/L (ref 0–44)
AST: 20 U/L (ref 15–41)
Albumin: 3.3 g/dL — ABNORMAL LOW (ref 3.5–5.0)
Alkaline Phosphatase: 85 U/L (ref 38–126)
Anion gap: 10 (ref 5–15)
BUN: 6 mg/dL (ref 6–20)
CO2: 31 mmol/L (ref 22–32)
Calcium: 8.2 mg/dL — ABNORMAL LOW (ref 8.9–10.3)
Chloride: 99 mmol/L (ref 98–111)
Creatinine, Ser: 0.95 mg/dL (ref 0.44–1.00)
GFR calc Af Amer: >60 mL/min (ref >60)
GFR calc non Af Amer: >60 mL/min (ref >60)
Glucose, Bld: 107 mg/dL — ABNORMAL HIGH (ref 70–99)
Potassium: 2.7 mmol/L — CL (ref 3.5–5.1)
Sodium: 140 mmol/L (ref 135–145)
Total Bilirubin: 0.6 mg/dL (ref 0.3–1.2)
Total Protein: 6.3 g/dL — ABNORMAL LOW (ref 6.5–8.1)

## 2019-08-29 LAB — VALPROIC ACID LEVEL: Valproic Acid Lvl: <10 ug/mL — ABNORMAL LOW (ref 50.0–100.0)

## 2019-08-29 LAB — RESPIRATORY PANEL BY RT PCR (FLU A&B, COVID)
Influenza A by PCR: NEGATIVE
Influenza B by PCR: NEGATIVE
SARS Coronavirus 2 by RT PCR: NEGATIVE

## 2019-08-29 LAB — RAPID URINE DRUG SCREEN, HOSP PERFORMED
Amphetamines: NOT DETECTED
Barbiturates: NOT DETECTED
Benzodiazepines: NOT DETECTED
Cocaine: NOT DETECTED
Opiates: NOT DETECTED
Tetrahydrocannabinol: NOT DETECTED

## 2019-08-29 LAB — POC URINE PREG, ED: Preg Test, Ur: NEGATIVE

## 2019-08-29 LAB — LIPASE, BLOOD: Lipase: 23 U/L (ref 11–51)

## 2019-08-29 LAB — APTT: aPTT: 30 seconds (ref 24–36)

## 2019-08-29 MED ORDER — SODIUM CHLORIDE 0.9 % IV BOLUS
1000.0000 mL | Freq: Once | INTRAVENOUS | Status: AC
  Administered 2019-08-29: 1000 mL via INTRAVENOUS

## 2019-08-29 MED ORDER — POTASSIUM CHLORIDE 10 MEQ/100ML IV SOLN
10.0000 meq | INTRAVENOUS | Status: AC
  Administered 2019-08-29 (×3): 10 meq via INTRAVENOUS
  Filled 2019-08-29 (×3): qty 100

## 2019-08-29 MED ORDER — SODIUM CHLORIDE 0.9 % IV SOLN: 2.0000 g | Freq: Once | INTRAVENOUS | Status: DC

## 2019-08-29 MED ORDER — SODIUM CHLORIDE 0.9 % IV SOLN
2.0000 g | Freq: Once | INTRAVENOUS | Status: AC
  Administered 2019-08-29: 2 g via INTRAVENOUS
  Filled 2019-08-29: qty 2

## 2019-08-29 MED ORDER — SODIUM CHLORIDE 0.9 % IV SOLN: 2.0000 g | Freq: Three times a day (TID) | INTRAVENOUS | Status: DC

## 2019-08-29 MED ORDER — ONDANSETRON HCL 4 MG/2ML IJ SOLN
4.0000 mg | Freq: Once | INTRAMUSCULAR | Status: AC
  Administered 2019-08-29: 4 mg via INTRAVENOUS
  Filled 2019-08-29: qty 2

## 2019-08-29 MED ORDER — METRONIDAZOLE IN NACL 5-0.79 MG/ML-% IV SOLN
500.0000 mg | Freq: Once | INTRAVENOUS | Status: AC
  Administered 2019-08-29: 500 mg via INTRAVENOUS
  Filled 2019-08-29: qty 100

## 2019-08-29 MED ORDER — VANCOMYCIN HCL 1250 MG/250ML IV SOLN: 1250.0000 mg | INTRAVENOUS | Status: DC

## 2019-08-29 MED ORDER — DOXYCYCLINE HYCLATE 100 MG PO CAPS: 100.0000 mg | ORAL_CAPSULE | Freq: Two times a day (BID) | ORAL | 0 refills | Status: DC

## 2019-08-29 MED ORDER — VANCOMYCIN HCL IN DEXTROSE 1-5 GM/200ML-% IV SOLN
1000.0000 mg | Freq: Once | INTRAVENOUS | Status: AC
  Administered 2019-08-29: 1000 mg via INTRAVENOUS
  Filled 2019-08-29: qty 200

## 2019-08-29 MED ORDER — OXYCODONE-ACETAMINOPHEN 5-325 MG PO TABS
1.0000 | ORAL_TABLET | Freq: Four times a day (QID) | ORAL | Status: DC | PRN
  Administered 2019-08-29: 1 via ORAL
  Filled 2019-08-29: qty 1

## 2019-08-29 MED ORDER — IOHEXOL 300 MG/ML  SOLN
100.0000 mL | Freq: Once | INTRAMUSCULAR | Status: AC | PRN
  Administered 2019-08-29: 100 mL via INTRAVENOUS

## 2019-08-29 NOTE — Progress Notes (Signed)
Pharmacy Antibiotic Note  Elizabeth [name] is a 49 y.o. female admitted on 08/29/2019 with sepsis.  Pharmacy has been consulted for Vancomycin/Cefepime dosing. WBC elevated. Renal function OK. Recent discharge.   Plan: Vancomycin 1250 mg IV q24h >>Estimated AUC: 548 Cefepime 2g IV q8h  Trend WBC, temp, renal function  F/U infectious work-up Drug levels as indicated   Height: 5' (152.4 cm) Weight: 146 lb (66.2 kg) IBW/kg (Calculated) : 45.5  Temp (24hrs), Avg:101.3 F (38.5 C), Min:101.3 F (38.5 C), Max:101.3 F (38.5 C)  Recent Labs  Lab 08/23/19 0323 08/24/19 0301 08/25/19 0257 08/26/19 0217 08/29/19 0604 08/29/19 0605  WBC 12.4* 10.1 9.5 7.2 12.0*  --   CREATININE 0.91 1.07* 1.01* 0.81 0.95  --   LATICACIDVEN  --   --   --   --   --  0.8    Estimated Creatinine Clearance: 62.2 mL/min (by C-G formula based on SCr of 0.95 mg/dL).    Allergies  Allergen Reactions  . Penicillins Itching and Other (See Comments)  . Ibuprofen Other (See Comments)  . Morphine   . Cariprazine Anxiety    Pt states she has hallucinations and suicidal thoughts when taking this medicine  Pt states she has hallucinations and suicidal thoughts when taking this medicine    . Tramadol Rash and Other (See Comments)    headaches    Narda Bonds, PharmD, BCPS Clinical Pharmacist Phone: [phone]

## 2019-08-29 NOTE — ED Notes (Signed)
Patient c/o nausea at this time. Order for zofran placed.

## 2019-08-29 NOTE — ED Triage Notes (Signed)
Pt c/o abdominal pain and was seen here this morning. She left AMA and returns with rising fevers and pain.

## 2019-08-29 NOTE — ED Triage Notes (Signed)
Pt reports abd pain that started last night after eating fried oysters. Shortly after abd pain started, vomiting started. Pt says she has vomited twice and continues to have abd pain. Pt wears oxygen all the time 2L.  Pt was dropped off by spouse- did not bring any oxygen with her.

## 2019-08-29 NOTE — ED Provider Notes (Signed)
I spoke with this patient at approximately 11:20 AM, I have gone over all of her results with her, I have expressed my concern that she may have bacteremia or sepsis, she refuses to stay in the hospital stating that she does not like hospitals and wants to go home.  I will give her an antibiotic and she will sign out against [place].  She is aware that she can return at any time and understands very clearly and she is able to do teach back with me that she may die if she leaves and has a serious infection.  She still does not want stay in the hospital.  She appears to have medical decision-making capacity at this time.   Noemi Chapel, MD 08/29/19 1125

## 2019-08-29 NOTE — ED Notes (Signed)
Date and time results received: 08/29/19 7:00 AM  (use smartphrase ".now" to insert current time)  Test: Potassium  Critical Value: 2.7  Name of Provider Notified: Dr. Wyvonnia Dusky  Orders Received? Or Actions Taken?: see chart

## 2019-08-29 NOTE — Discharge Instructions (Addendum)
You have chosen to leave [name].  Please be aware that you may have a bacterial infection in your bloodstream.  This can kill you.  You have expressed to me your understanding that you may die by leaving against advice and yet it is still your wishes that you want to go home.  You are more than welcome to return should you change your mind at any time.

## 2019-08-29 NOTE — ED Notes (Signed)
Patient refusing admission to the hospital. EDP reviewed the risks of leaving and patient voiced understanding. Patient signing out AMA.
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
## 2019-08-30 ENCOUNTER — Encounter (HOSPITAL_COMMUNITY): Payer: Self-pay | Admitting: Internal Medicine

## 2019-08-30 ENCOUNTER — Other Ambulatory Visit: Payer: Self-pay

## 2019-08-30 DIAGNOSIS — R197 Diarrhea, unspecified: Secondary | ICD-10-CM

## 2019-08-30 DIAGNOSIS — E876 Hypokalemia: Secondary | ICD-10-CM | POA: Diagnosis not present

## 2019-08-30 LAB — BASIC METABOLIC PANEL
Anion gap: 7 (ref 5–15)
Anion gap: 10 (ref 5–15)
BUN: 6 mg/dL (ref 6–20)
BUN: 5 mg/dL — ABNORMAL LOW (ref 6–20)
CO2: 33 mmol/L — ABNORMAL HIGH (ref 22–32)
CO2: 30 mmol/L (ref 22–32)
Calcium: 8 mg/dL — ABNORMAL LOW (ref 8.9–10.3)
Calcium: 7.9 mg/dL — ABNORMAL LOW (ref 8.9–10.3)
Chloride: 100 mmol/L (ref 98–111)
Chloride: 99 mmol/L (ref 98–111)
Creatinine, Ser: 1.01 mg/dL — ABNORMAL HIGH (ref 0.44–1.00)
Creatinine, Ser: 0.99 mg/dL (ref 0.44–1.00)
GFR calc Af Amer: >60 mL/min (ref >60)
GFR calc Af Amer: >60 mL/min (ref >60)
GFR calc non Af Amer: >60 mL/min (ref >60)
GFR calc non Af Amer: >60 mL/min (ref >60)
Glucose, Bld: 108 mg/dL — ABNORMAL HIGH (ref 70–99)
Glucose, Bld: 135 mg/dL — ABNORMAL HIGH (ref 70–99)
Potassium: 2.7 mmol/L — CL (ref 3.5–5.1)
Potassium: 2.9 mmol/L — ABNORMAL LOW (ref 3.5–5.1)
Sodium: 140 mmol/L (ref 135–145)
Sodium: 139 mmol/L (ref 135–145)

## 2019-08-30 LAB — CBC WITH DIFFERENTIAL/PLATELET
Abs Immature Granulocytes: 0.16 10*3/uL — ABNORMAL HIGH (ref 0.00–0.07)
Basophils Absolute: 0 10*3/uL (ref 0.0–0.1)
Basophils Relative: 0 %
Eosinophils Absolute: 0.1 10*3/uL (ref 0.0–0.5)
Eosinophils Relative: 1 %
HCT: 34.9 % — ABNORMAL LOW (ref 36.0–46.0)
Hemoglobin: 10.5 g/dL — ABNORMAL LOW (ref 12.0–15.0)
Immature Granulocytes: 2 %
Lymphocytes Relative: 32 %
Lymphs Abs: 3.1 10*3/uL (ref 0.7–4.0)
MCH: 28.3 pg (ref 26.0–34.0)
MCHC: 30.1 g/dL (ref 30.0–36.0)
MCV: 94.1 fL (ref 80.0–100.0)
Monocytes Absolute: 0.9 10*3/uL (ref 0.1–1.0)
Monocytes Relative: 9 %
Neutro Abs: 5.5 10*3/uL (ref 1.7–7.7)
Neutrophils Relative %: 56 %
Platelets: 276 10*3/uL (ref 150–400)
RBC: 3.71 MIL/uL — ABNORMAL LOW (ref 3.87–5.11)
RDW: 19.3 % — ABNORMAL HIGH (ref 11.5–15.5)
WBC: 9.7 10*3/uL (ref 4.0–10.5)
nRBC: 0 % (ref 0.0–0.2)

## 2019-08-30 LAB — URINE CULTURE

## 2019-08-30 LAB — TSH: TSH: 2.791 u[IU]/mL (ref 0.350–4.500)

## 2019-08-30 LAB — LACTIC ACID, PLASMA: Lactic Acid, Venous: 0.9 mmol/L (ref 0.5–1.9)

## 2019-08-30 LAB — MAGNESIUM
Magnesium: 1.2 mg/dL — ABNORMAL LOW (ref 1.7–2.4)
Magnesium: 1.5 mg/dL — ABNORMAL LOW (ref 1.7–2.4)

## 2019-08-30 LAB — BRAIN NATRIURETIC PEPTIDE: B Natriuretic Peptide: 264 pg/mL — ABNORMAL HIGH (ref 0.0–100.0)

## 2019-08-30 MED ORDER — ONDANSETRON HCL 4 MG/2ML IJ SOLN
4.0000 mg | Freq: Four times a day (QID) | INTRAMUSCULAR | Status: DC | PRN
  Administered 2019-08-30: 4 mg via INTRAVENOUS
  Filled 2019-08-30: qty 2

## 2019-08-30 MED ORDER — MAGNESIUM SULFATE 2 GM/50ML IV SOLN
2.0000 g | Freq: Once | INTRAVENOUS | Status: AC
  Administered 2019-08-30: 02:00:00 2 g via INTRAVENOUS
  Filled 2019-08-30: qty 50

## 2019-08-30 MED ORDER — SODIUM CHLORIDE 0.9 % IV BOLUS
500.0000 mL | Freq: Once | INTRAVENOUS | Status: AC
  Administered 2019-08-30: 500 mL via INTRAVENOUS

## 2019-08-30 MED ORDER — ACETAMINOPHEN 325 MG PO TABS
650.0000 mg | ORAL_TABLET | Freq: Four times a day (QID) | ORAL | Status: DC | PRN
  Administered 2019-08-31: 650 mg via ORAL
  Filled 2019-08-30: qty 2

## 2019-08-30 MED ORDER — OXYCODONE HCL ER 10 MG PO T12A
10.0000 mg | EXTENDED_RELEASE_TABLET | Freq: Every day | ORAL | Status: DC | PRN
  Administered 2019-08-30 – 2019-08-31 (×2): 10 mg via ORAL
  Filled 2019-08-30 (×2): qty 1

## 2019-08-30 MED ORDER — IPRATROPIUM-ALBUTEROL 0.5-2.5 (3) MG/3ML IN SOLN: 3.0000 mL | Freq: Four times a day (QID) | RESPIRATORY_TRACT | Status: DC | PRN

## 2019-08-30 MED ORDER — PANTOPRAZOLE SODIUM 40 MG PO TBEC: 40.0000 mg | DELAYED_RELEASE_TABLET | Freq: Two times a day (BID) | ORAL | Status: DC

## 2019-08-30 MED ORDER — ACETAMINOPHEN 650 MG RE SUPP: 650.0000 mg | Freq: Four times a day (QID) | RECTAL | Status: DC | PRN

## 2019-08-30 MED ORDER — ELVITEG-COBIC-EMTRICIT-TENOFAF 150-150-200-10 MG PO TABS
1.0000 | ORAL_TABLET | Freq: Every morning | ORAL | Status: DC
  Administered 2019-08-30 – 2019-08-31 (×2): 1 via ORAL
  Filled 2019-08-30 (×3): qty 1

## 2019-08-30 MED ORDER — TRAMADOL HCL 50 MG PO TABS: 50.0000 mg | ORAL_TABLET | Freq: Three times a day (TID) | ORAL | Status: DC | PRN

## 2019-08-30 MED ORDER — ROPINIROLE HCL 1 MG PO TABS
1.0000 mg | ORAL_TABLET | Freq: Every day | ORAL | Status: DC
  Administered 2019-08-30: 1 mg via ORAL
  Filled 2019-08-30 (×3): qty 1

## 2019-08-30 MED ORDER — SUCRALFATE 1 G PO TABS
1.0000 g | ORAL_TABLET | Freq: Four times a day (QID) | ORAL | Status: DC
  Administered 2019-08-30 – 2019-08-31 (×5): 1 g via ORAL
  Filled 2019-08-30 (×5): qty 1

## 2019-08-30 MED ORDER — MOMETASONE FURO-FORMOTEROL FUM 100-5 MCG/ACT IN AERO
2.0000 | INHALATION_SPRAY | Freq: Two times a day (BID) | RESPIRATORY_TRACT | Status: DC
  Administered 2019-08-30 – 2019-08-31 (×3): 2 via RESPIRATORY_TRACT
  Filled 2019-08-30: qty 8.8

## 2019-08-30 MED ORDER — VENLAFAXINE HCL 37.5 MG PO TABS
37.5000 mg | ORAL_TABLET | Freq: Two times a day (BID) | ORAL | Status: DC
  Administered 2019-08-30 – 2019-08-31 (×3): 37.5 mg via ORAL
  Filled 2019-08-30 (×8): qty 1

## 2019-08-30 MED ORDER — POTASSIUM CHLORIDE CRYS ER 20 MEQ PO TBCR
40.0000 meq | EXTENDED_RELEASE_TABLET | ORAL | Status: DC
  Administered 2019-08-30 (×2): 40 meq via ORAL
  Filled 2019-08-30 (×2): qty 2

## 2019-08-30 MED ORDER — SODIUM CHLORIDE 0.9% FLUSH
3.0000 mL | Freq: Two times a day (BID) | INTRAVENOUS | Status: DC
  Administered 2019-08-30 – 2019-08-31 (×3): 3 mL via INTRAVENOUS

## 2019-08-30 MED ORDER — LEVETIRACETAM 500 MG PO TABS
750.0000 mg | ORAL_TABLET | Freq: Two times a day (BID) | ORAL | Status: DC
  Administered 2019-08-30 – 2019-08-31 (×3): 750 mg via ORAL
  Filled 2019-08-30 (×3): qty 1

## 2019-08-30 MED ORDER — ACETAMINOPHEN 500 MG PO TABS
1000.0000 mg | ORAL_TABLET | Freq: Once | ORAL | Status: AC
  Administered 2019-08-30: 1000 mg via ORAL
  Filled 2019-08-30: qty 2

## 2019-08-30 MED ORDER — POLYETHYLENE GLYCOL 3350 17 G PO PACK: 17.0000 g | PACK | Freq: Every day | ORAL | Status: DC | PRN

## 2019-08-30 MED ORDER — VALACYCLOVIR HCL 500 MG PO TABS
500.0000 mg | ORAL_TABLET | Freq: Two times a day (BID) | ORAL | Status: DC
  Administered 2019-08-30 – 2019-08-31 (×3): 500 mg via ORAL
  Filled 2019-08-30 (×3): qty 1

## 2019-08-30 MED ORDER — POTASSIUM CHLORIDE 20 MEQ PO PACK: 40.0000 meq | PACK | ORAL | Status: DC

## 2019-08-30 MED ORDER — HYDROXYZINE HCL 10 MG PO TABS
10.0000 mg | ORAL_TABLET | Freq: Three times a day (TID) | ORAL | Status: DC | PRN
  Filled 2019-08-30: qty 1

## 2019-08-30 MED ORDER — UMECLIDINIUM BROMIDE 62.5 MCG/INH IN AEPB
1.0000 | INHALATION_SPRAY | Freq: Every day | RESPIRATORY_TRACT | Status: DC
  Administered 2019-08-30 – 2019-08-31 (×2): 1 via RESPIRATORY_TRACT
  Filled 2019-08-30: qty 7

## 2019-08-30 MED ORDER — PANTOPRAZOLE SODIUM 40 MG IV SOLR
40.0000 mg | Freq: Two times a day (BID) | INTRAVENOUS | Status: DC
  Administered 2019-08-30 – 2019-08-31 (×3): 40 mg via INTRAVENOUS
  Filled 2019-08-30 (×3): qty 40

## 2019-08-30 MED ORDER — POTASSIUM CHLORIDE 10 MEQ/100ML IV SOLN
10.0000 meq | Freq: Once | INTRAVENOUS | Status: AC
  Administered 2019-08-30: 10 meq via INTRAVENOUS

## 2019-08-30 MED ORDER — ALBUTEROL SULFATE HFA 108 (90 BASE) MCG/ACT IN AERS: 2.0000 | INHALATION_SPRAY | Freq: Four times a day (QID) | RESPIRATORY_TRACT | Status: DC | PRN

## 2019-08-30 MED ORDER — MAGNESIUM SULFATE 2 GM/50ML IV SOLN
2.0000 g | Freq: Once | INTRAVENOUS | Status: AC
  Administered 2019-08-30: 2 g via INTRAVENOUS
  Filled 2019-08-30: qty 50

## 2019-08-30 MED ORDER — GABAPENTIN 300 MG PO CAPS
300.0000 mg | ORAL_CAPSULE | Freq: Three times a day (TID) | ORAL | Status: DC
  Administered 2019-08-30 – 2019-08-31 (×4): 300 mg via ORAL
  Filled 2019-08-30 (×4): qty 1

## 2019-08-30 MED ORDER — ATORVASTATIN CALCIUM 20 MG PO TABS
20.0000 mg | ORAL_TABLET | Freq: Every day | ORAL | Status: DC
  Administered 2019-08-30: 20 mg via ORAL
  Filled 2019-08-30: qty 1

## 2019-08-30 MED ORDER — ONDANSETRON HCL 4 MG/2ML IJ SOLN
4.0000 mg | Freq: Four times a day (QID) | INTRAMUSCULAR | Status: DC
  Administered 2019-08-30 – 2019-08-31 (×4): 4 mg via INTRAVENOUS
  Filled 2019-08-30 (×4): qty 2

## 2019-08-30 MED ORDER — TIOTROPIUM BROMIDE MONOHYDRATE 18 MCG IN CAPS: 18.0000 ug | ORAL_CAPSULE | Freq: Every day | RESPIRATORY_TRACT | Status: DC

## 2019-08-30 MED ORDER — SODIUM CHLORIDE 0.9 % IV SOLN: 250.0000 mL | INTRAVENOUS | Status: DC | PRN

## 2019-08-30 MED ORDER — SODIUM CHLORIDE 0.9 % IV SOLN: INTRAVENOUS | Status: DC

## 2019-08-30 MED ORDER — ENOXAPARIN SODIUM 40 MG/0.4ML ~~LOC~~ SOLN
40.0000 mg | SUBCUTANEOUS | Status: DC
  Administered 2019-08-30 – 2019-08-31 (×2): 40 mg via SUBCUTANEOUS
  Filled 2019-08-30 (×2): qty 0.4

## 2019-08-30 MED ORDER — ENSURE ENLIVE PO LIQD: 237.0000 mL | Freq: Two times a day (BID) | ORAL | Status: DC

## 2019-08-30 MED ORDER — OXYCODONE-ACETAMINOPHEN 5-325 MG PO TABS
1.0000 | ORAL_TABLET | Freq: Once | ORAL | Status: AC
  Administered 2019-08-30: 02:00:00 1 via ORAL
  Filled 2019-08-30: qty 1

## 2019-08-30 MED ORDER — SODIUM CHLORIDE 0.9% FLUSH: 3.0000 mL | INTRAVENOUS | Status: DC | PRN

## 2019-08-30 MED ORDER — ORAL CARE MOUTH RINSE
15.0000 mL | Freq: Two times a day (BID) | OROMUCOSAL | Status: DC
  Administered 2019-08-30: 15 mL via OROMUCOSAL

## 2019-08-30 MED ORDER — ONDANSETRON HCL 4 MG PO TABS: 4.0000 mg | ORAL_TABLET | Freq: Four times a day (QID) | ORAL | Status: DC | PRN

## 2019-08-30 MED ORDER — POTASSIUM CHLORIDE 10 MEQ/100ML IV SOLN
10.0000 meq | INTRAVENOUS | Status: AC
  Administered 2019-08-30 (×6): 10 meq via INTRAVENOUS
  Filled 2019-08-30 (×6): qty 100

## 2019-08-30 MED ORDER — OXYCODONE-ACETAMINOPHEN 5-325 MG PO TABS
1.0000 | ORAL_TABLET | Freq: Four times a day (QID) | ORAL | Status: DC | PRN
  Administered 2019-08-30 – 2019-08-31 (×4): 1 via ORAL
  Filled 2019-08-30 (×4): qty 1

## 2019-08-30 MED ORDER — DIVALPROEX SODIUM 250 MG PO DR TAB
250.0000 mg | DELAYED_RELEASE_TABLET | Freq: Every day | ORAL | Status: DC
  Administered 2019-08-30 – 2019-08-31 (×2): 250 mg via ORAL
  Filled 2019-08-30 (×2): qty 1

## 2019-08-30 MED ORDER — METRONIDAZOLE IN NACL 5-0.79 MG/ML-% IV SOLN
500.0000 mg | Freq: Three times a day (TID) | INTRAVENOUS | Status: DC
  Administered 2019-08-30 – 2019-08-31 (×4): 500 mg via INTRAVENOUS
  Filled 2019-08-30 (×4): qty 100

## 2019-08-30 MED ORDER — BISACODYL 10 MG RE SUPP: 10.0000 mg | Freq: Every day | RECTAL | Status: DC | PRN

## 2019-08-30 MED ORDER — POTASSIUM CHLORIDE 10 MEQ/100ML IV SOLN
10.0000 meq | Freq: Once | INTRAVENOUS | Status: AC
  Administered 2019-08-30: 10 meq via INTRAVENOUS
  Filled 2019-08-30: qty 100

## 2019-08-30 MED ORDER — OXYCODONE ER 13.5 MG PO C12A: 13.5000 mg | EXTENDED_RELEASE_CAPSULE | Freq: Every day | ORAL | Status: DC | PRN

## 2019-08-30 MED ORDER — DIVALPROEX SODIUM 250 MG PO DR TAB
500.0000 mg | DELAYED_RELEASE_TABLET | Freq: Every day | ORAL | Status: DC
  Administered 2019-08-30: 500 mg via ORAL
  Filled 2019-08-30: qty 2

## 2019-08-30 MED ORDER — SODIUM CHLORIDE 0.9 % IV SOLN
2.0000 g | INTRAVENOUS | Status: DC
  Administered 2019-08-30 – 2019-08-31 (×2): 2 g via INTRAVENOUS
  Filled 2019-08-30 (×3): qty 20

## 2019-08-30 MED ORDER — ALUM & MAG HYDROXIDE-SIMETH 200-200-20 MG/5ML PO SUSP: 30.0000 mL | ORAL | Status: DC | PRN

## 2019-08-30 NOTE — Progress Notes (Signed)
Per HPI: Elizabeth [name] is a 49 y.o. female with medical history significant of HIV, COPD, seizure d/o, polysubstance abuse who presented to the ER with abdominal pain, diarrhea. Review of records shows patient had a recent admission from 08/15/2019-08/26/2019 with drug overdose and respiratory failure requiring mechanical ventilation. Patient started having nausea, epigastric pain, vomiting and diarrhea starting Friday, 08/27/2019.  She reports she has had 4-5 episodes of vomiting since then and has had a decreased appetite and decreased ability to keep food down p.o.  Has also had 7-8 episodes of watery diarrhea daily.  Patient also reports increased weakness, difficulty with ambulation.  Has had mild bilateral lower extremity edema. No hematemesis, melena, hematochezia.   Noted a temperature of 100.4 in the ER.  3/29: Patient has been admitted with likely gastroenteritis with associated severe electrolyte disturbances of hypokalemia and hypomagnesemia.  She will need continuing supplementation of magnesium and potassium with IV fluid hydration.  She continues to have some abdominal pain, but no significant diarrhea, nausea, or vomiting and is able to tolerate her diet.  Continue pain management with oral narcotics as needed.  Recheck levels of electrolytes in a.m.  Anticipate discharge by a.m. if she is further improved.  Continue Rocephin and Flagyl for now.  Total care time: 40 minutes.

## 2019-08-30 NOTE — Progress Notes (Signed)
Patient discharged before full assessment completed.

## 2019-08-30 NOTE — H&P (Signed)
History and Physical    Patient Demographics:    Elizabeth [name] JME:[id number] DOB: [birth year] DOA: 08/29/2019  PCP: Edison Pace, MD  Patient coming from: Home  I have personally briefly reviewed patient's old medical records in [place]  Chief Complaint: Abdominal pain   Assessment & Plan:     Assessment/Plan Principal Problem:   Hypokalemia Active Problems:   Human immunodeficiency virus (HIV) disease ([place])   Depression   COPD (chronic obstructive pulmonary disease) ([place])   Lung cancer ([place])   Polysubstance abuse ([place])   Diarrhea     Principal Problem: Hypokalemia / Hypomagnesemia Potassium 2.7, magnesium 1.2 on presentation.  Likely secondary to diarrhea, decreased p.o. intake -Telemetry monitoring -Monitor and supplement electrolytes closely  Other Active Problems: Acute febrile illness with diarrhea possibly secondary to gastroenteritis Patient presented with a 2-day history of nausea, vomiting, abdominal pain, multiple episodes of diarrhea, fever.  Mild leukocytosis noted yesterday which is improved today.  Lactic acid is normal.  Was recently seen in the ER on the morning of 08/21/2019 but decided to sign out [place].  Return back due to persistent symptoms.  Recent admission at [facility] requiring intubation and mechanical ventilation.  CT abdomen does not show any evidence of colitis. -We will send stool work-up including C. Difficile -Stool culture, ova and parasites -We will empirically place on Flagyl  Mild fluid overload Patient has bilateral lower extremity edema.  Chest x-ray shows small bilateral pleural effusions with mild interstitial thickening which may represent interstitial edema.  Echocardiogram reviewed from 08/16/2019 which showed EF of 55 to 60%, no regional wall motion abnormalities.  LV diastolic parameters are normal.  Moderately elevated pulmonary artery systolic pressure. Albumin is 3.3.  Renal function is at baseline. -We  will send BNP, TSH. will defer IV fluids for now -Monitor output and consider diuresis if not improved.  Essential hypertension -Blood pressure acceptable, continue clonidine  History of seizures -EEG negative, continue Keppra, Depakote, seizure precautions  HIV disease Most recent CD4 was noted to be 262 -complete Genvoya, Valtrex  History of polysubstance abuse Review of records shows patient had a recent admission from 08/15/2019-08/26/2019 with drug overdose and respiratory failure requiring mechanical ventilation. She underwent EEG which was negative, she underwent an MRI with punctate area concerning for hypoxic/anoxic injury   Depression / Anxiety -Continue Effexor, hydroxyzine as needed  DVT prophylaxis: Lovenox Code Status:  Full code Family Communication: N/A  Disposition Plan: Place in observation for electrolyte supplementation, evaluation of diarrhea Consults called: N/A Admission status: Observation stay    HPI:     HPI: Elizabeth [name] is a 49 y.o. female with medical history significant of HIV, COPD, seizure d/o, polysubstance abuse who presented to the ER with abdominal pain, diarrhea. Review of records shows patient had a recent admission from 08/15/2019-08/26/2019 with drug overdose and respiratory failure requiring mechanical ventilation. Patient started having nausea, epigastric pain, vomiting and diarrhea starting Friday, 08/27/2019.  She reports she has had 4-5 episodes of vomiting since then and has had a decreased appetite and decreased ability to keep food down p.o.  Has also had 7-8 episodes of watery diarrhea daily.  Patient also reports increased weakness, difficulty with ambulation.  Has had mild bilateral lower extremity edema. No hematemesis, melena, hematochezia.   Noted a temperature of 100.4 in the ER. ED Course:  Vital Signs reviewed on presentation, significant for temperature 100.4, heart rate 88, blood pressure 126/92, saturation 98% on 2 L nasal  cannula. Labs  reviewed, significant for sodium 141 potassium 2.7, BUN 6, creatinine 1.01, lactic acid 0.9, WBC count 9.7, hemoglobin 10.5, hematocrit 34.9, platelets 276.  Pregnancy test is negative.  Influenza and SARS Covid RT-PCR negative.  Blood and urine cultures have been sent. Imaging personally Reviewed, CT of the abdomen and pelvis with IV contrast shows no acute intra-abdominal or intrapelvic abnormalities.  Small bilateral pleural effusions with atelectasis.  3.2 cm cyst in the right adnexa probably follicular cyst. Chest x-ray shows mild bilateral interstitial thickening which may reflect interstitial edema or infection. EKG personally reviewed, shows sinus rhythm, no acute ST-T changes.    Review of systems:    Review of Systems: As per HPI otherwise 10 point review of systems negative.  All other review of systems is negative except the ones noted above in the HPI.    Past Medical and Surgical History:  Reviewed by me  Past Medical History:  Diagnosis Date   Asthma    uses Albuterol inhaler daily as needed   COPD (chronic obstructive pulmonary disease) ([place])    Depression    takes Trazodone and Seroquel nightly   Dizziness    Dysphagia    Headache(784.0)    History of migraine    last one about 2wks ago   HIV (human immunodeficiency virus infection) ([place])    takes Stribild daily   Lung cancer ([place])    Nocturia    Seizures ([place])    takes Depakote daily-last one about 6 months ago as of 01/02/19   Shortness of breath    sitting/lying/exertion   Substance abuse ([place])    Urinary frequency     Past Surgical History:  Procedure Laterality Date   BREAST SURGERY Left    mass removed from left breast-fatty tissue   CESAREAN SECTION  78yr ago   VIDEO ASSISTED THORACOSCOPY (VATS)/WEDGE RESECTION Right 07/05/2013   Procedure: VIDEO ASSISTED THORACOSCOPY (VATS)/WEDGE RESECTION, Right Upper Lobectomy with lymph node disecction and OnQ placement;  Surgeon:  SMelrose Nakayama MD;  [facility]  Service: Thoracic;  Laterality: Right;  (R)VATS, WEDGE RESECTION, POSSIBLE LOBECTOMY, POSSIBLE CHEST WALL RESECTION   VIDEO BRONCHOSCOPY Bilateral 05/05/2013   Procedure: VIDEO BRONCHOSCOPY WITH FLUORO;  Surgeon: MTanda Rockers MD;  Location: [facility];  Service: Cardiopulmonary;  Laterality: Bilateral;     Social History:  Reviewed by me   reports that she has been smoking cigarettes. She has a 96.00 pack-year smoking history. She has never used smokeless tobacco. She reports that she does not drink alcohol or use drugs.  Allergies:    Allergies  Allergen Reactions   Penicillins Itching and Other (See Comments)   Ibuprofen Other (See Comments)   Morphine    Cariprazine Anxiety    Pt states she has hallucinations and suicidal thoughts when taking this medicine  Pt states she has hallucinations and suicidal thoughts when taking this medicine     Tramadol Rash and Other (See Comments)    headaches     Family History :   Family History  Problem Relation Age of Onset   Diabetes Mother    CAD Mother    Hypertension Mother    Hypertension Sister    Lung cancer Maternal Aunt        smoked   Allergies Sister    Asthma Sister    Lung cancer Maternal Uncle        never smoker   Lung cancer Maternal Grandmother        never  smoker   Family history reviewed, noted as above, not pertinent to current presentation.   Home Medications:    Prior to Admission medications   Medication Sig Start Date End Date Taking? Authorizing Provider  albuterol (PROVENTIL HFA;VENTOLIN HFA) 108 (90 BASE) MCG/ACT inhaler Inhale 2 puffs into the lungs every 6 (six) hours as needed for wheezing or shortness of breath.    [provider]  albuterol (PROVENTIL) (2.5 MG/3ML) 0.083% nebulizer solution Take 6 mLs (5 mg total) by nebulization every 6 (six) hours as needed for wheezing or shortness of breath. 10/28/13   Kirichenko, [place] Rocker,  PA-C  atorvastatin (LIPITOR) 20 MG tablet Take 20 mg by mouth daily.  03/19/18   [provider]  divalproex (DEPAKOTE) 250 MG DR tablet Take 250-500 mg by mouth 2 (two) times daily. 250 mg in the am and 500 in the pm    [provider]  doxycycline (VIBRAMYCIN) 100 MG capsule Take 1 capsule (100 mg total) by mouth 2 (two) times daily. 08/29/19   Noemi Chapel, MD  gabapentin (NEURONTIN) 300 MG capsule Take 300 mg by mouth 3 (three) times daily.  09/14/18   [provider]  GENVOYA 150-150-200-10 MG TABS tablet Take 1 tablet by mouth every morning.  03/19/18   [provider]  hydrOXYzine (ATARAX/VISTARIL) 25 MG tablet Take 10 mg by mouth 3 (three) times daily as needed.  05/20/18   [provider]  ipratropium-albuterol (DUONEB) 0.5-2.5 (3) MG/3ML SOLN Inhale 3 mLs into the lungs every 6 (six) hours as needed (for shortness of breath-wheezing).  03/19/18   [provider]  levETIRAcetam (KEPPRA) 750 MG tablet Take 750 mg by mouth 2 (two) times daily.  03/19/18   [provider]  mometasone-formoterol (DULERA) 100-5 MCG/ACT AERO Inhale 2 puffs into the lungs 2 (two) times daily.    [provider]  ondansetron (ZOFRAN) 4 MG tablet Take 4 mg by mouth every 8 (eight) hours as needed for nausea or vomiting.    [provider]  oxyCODONE ER (XTAMPZA ER) 13.5 MG C12A Take 13.5 mg by mouth daily as needed (pain).    [provider]  oxyCODONE-acetaminophen (PERCOCET/ROXICET) 5-325 MG tablet Take 1 tablet by mouth 4 (four) times daily as needed for moderate pain.    [provider]  pantoprazole (PROTONIX) 40 MG tablet Take 40 mg by mouth 2 (two) times daily. 11/08/18   [provider]  pregabalin (LYRICA) 75 MG capsule Take 1 capsule by mouth 3 (three) times daily. 10/01/18   [provider]  rOPINIRole (REQUIP) 1 MG tablet Take 1 mg by mouth at bedtime.  03/19/18   [provider]    SPIRIVA HANDIHALER 18 MCG inhalation capsule Place 18 mcg into inhaler and inhale daily.  03/19/18   [provider]  sucralfate (CARAFATE) 1 g tablet Take 1 g by mouth 4 (four) times daily.     [provider]  SYMBICORT 160-4.5 MCG/ACT inhaler Inhale 2 puffs into the lungs 2 (two) times daily.  03/19/18   [provider]  valACYclovir (VALTREX) 500 MG tablet Take 500 mg by mouth 2 (two) times daily.    [provider]  venlafaxine (EFFEXOR) 37.5 MG tablet Take 1 tablet by mouth 2 (two) times a day. 10/21/18   [provider]  Vitamin D, Ergocalciferol, (DRISDOL) 1.25 MG (50000 UT) CAPS capsule Take 1 capsule by mouth once a week. 06/18/18   [provider]    Physical Exam:  Physical Exam: Vitals:   08/29/19 2213 08/30/19 0200 08/30/19 0230 08/30/19 0510  BP: 126/82 117/87 (!) 126/92   Pulse: 95 86 88   Resp: 18 13 18    Temp: (!) 100.4 F (38 C)   99.7 F (37.6 C)  TempSrc: Oral   Oral  SpO2: 95% 100% 98%   Weight:      Height:        Constitutional: NAD, calm, comfortable Vitals:   08/29/19 2213 08/30/19 0200 08/30/19 0230 08/30/19 0510  BP: 126/82 117/87 (!) 126/92   Pulse: 95 86 88   Resp: 18 13 18    Temp: (!) 100.4 F (38 C)   99.7 F (37.6 C)  TempSrc: Oral   Oral  SpO2: 95% 100% 98%   Weight:      Height:       Eyes: PERRL, lids and conjunctivae normal ENMT: Mucous membranes are moist. Posterior pharynx clear of any exudate or lesions.Normal dentition.  Neck: normal, supple, no masses, no thyromegaly Respiratory: Mild decreased air entry at bases,. Normal respiratory effort. No accessory muscle use.  Cardiovascular: Regular rate and rhythm, no murmurs / rubs / gallops.  Bilateral 2+ pedal edema.  2+ pedal pulses. No carotid bruits.  Abdomen: no tenderness, no masses palpated. No hepatosplenomegaly. Bowel sounds positive.  Musculoskeletal: no clubbing / cyanosis. No joint deformity upper and lower extremities.  Good ROM, no contractures. Normal muscle tone.  Skin: no rashes, lesions, ulcers. No induration Neurologic: CN 2-12 grossly intact. Sensation intact, DTR normal. Strength 5/5 in all 4.  Psychiatric: Normal judgment and insight. Alert and oriented x 3. Normal mood.    Decubitus Ulcers: Not present on admission Catheters and tubes: None  Data Review:    Labs on Admission: I have personally reviewed following labs and imaging studies  CBC: Recent Labs  Lab 08/24/19 0301 08/25/19 0257 08/26/19 0217 08/29/19 0604 08/30/19 0045  WBC 10.1 9.5 7.2 12.0* 9.7  NEUTROABS  --   --   --  8.5* 5.5  HGB 8.6* 8.7* 8.7* 10.3* 10.5*  HCT 28.6* 28.4* 28.5* 34.8* 34.9*  MCV 91.1 90.2 90.5 93.5 94.1  PLT 356 300 235 310 235   Basic Metabolic Panel: Recent Labs  Lab 08/24/19 0301 08/24/19 0853 08/25/19 0257 08/25/19 0826 08/25/19 1424 08/25/19 2050 08/26/19 0217 08/29/19 0604 08/30/19 0045  NA 147*   < > 141   < > 143 141 137 140 140  K 3.2*  --  3.1*  --   --   --  3.0* 2.7* 2.7*  CL 116*  --  110  --   --   --  108 99 100  CO2 24  --  24  --   --   --  22 31 33*  GLUCOSE 123*  --  106*  --   --   --  110* 107* 108*  BUN 27*  --  18  --   --   --  13 6 6   CREATININE 1.07*  --  1.01*  --   --   --  0.81 0.95 1.01*  CALCIUM 8.4*  --  8.2*  --   --   --  7.7* 8.2* 8.0*  MG  --   --   --   --   --   --   --   --  1.2*   < > = values in this interval not displayed.   GFR: Estimated Creatinine Clearance: 58.5 mL/min (A) (by C-G  formula based on SCr of 1.01 mg/dL (H)). Liver Function Tests: Recent Labs  Lab 08/24/19 0301 08/25/19 0257 08/26/19 0217 08/29/19 0604  AST 22 21 20 20   ALT 97* 75* 60* 43  ALKPHOS 70 63 65 85  BILITOT 0.8 0.7 0.7 0.6  PROT 4.8* 4.9* 4.7* 6.3*  ALBUMIN 2.4* 2.4* 2.3* 3.3*   Recent Labs  Lab 08/29/19 0604  LIPASE 23   No results for input(s): AMMONIA in the last 168 hours. Coagulation Profile: Recent Labs  Lab 08/29/19 0604  INR 1.2    Cardiac Enzymes: No results for input(s): CKTOTAL, CKMB, CKMBINDEX, TROPONINI in the last 168 hours. BNP (last 3 results) No results for input(s): PROBNP in the last 8760 hours. HbA1C: No results for input(s): HGBA1C in the last 72 hours. CBG: Recent Labs  Lab 08/25/19 2230 08/25/19 2356 08/26/19 0417 08/26/19 0828 08/26/19 1206  GLUCAP 170* 196* 124* 107* 119*   Lipid Profile: No results for input(s): CHOL, HDL, LDLCALC, TRIG, CHOLHDL, LDLDIRECT in the last 72 hours. Thyroid Function Tests: No results for input(s): TSH, T4TOTAL, FREET4, T3FREE, THYROIDAB in the last 72 hours. Anemia Panel: No results for input(s): VITAMINB12, FOLATE, FERRITIN, TIBC, IRON, RETICCTPCT in the last 72 hours. Urine analysis:    Component Value Date/Time   COLORURINE YELLOW 08/29/2019 0542   APPEARANCEUR CLEAR 08/29/2019 0542   LABSPEC 1.009 08/29/2019 0542   PHURINE 6.0 08/29/2019 0542   GLUCOSEU NEGATIVE 08/29/2019 0542   GLUCOSEU NEG mg/dL 11/15/2009 1840   HGBUR MODERATE (A) 08/29/2019 0542   BILIRUBINUR NEGATIVE 08/29/2019 0542   KETONESUR NEGATIVE 08/29/2019 0542   PROTEINUR NEGATIVE 08/29/2019 0542   UROBILINOGEN 1.0 07/08/2013 1210   NITRITE NEGATIVE 08/29/2019 0542   LEUKOCYTESUR NEGATIVE 08/29/2019 0542     Imaging Results:      Radiological Exams on Admission: CT ABDOMEN PELVIS W CONTRAST  Result Date: 08/29/2019 CLINICAL DATA:  Sepsis. Abdomen pain. EXAM: CT ABDOMEN AND PELVIS WITH CONTRAST TECHNIQUE: Multidetector CT imaging of the abdomen and pelvis was performed using the standard protocol following bolus administration of intravenous contrast. CONTRAST:  150m OMNIPAQUE IOHEXOL 300 MG/ML  SOLN COMPARISON:  March 29, 2013 FINDINGS: Lower chest: There are small bilateral pleural effusions. Atelectasis of the lung bases are identified. The heart size is enlarged. Minimal pericardial effusion is noted. Hepatobiliary: No focal liver abnormality is seen. No gallstones,  gallbladder wall thickening, or biliary dilatation. Pancreas: Unremarkable. No pancreatic ductal dilatation or surrounding inflammatory changes. Spleen: Normal in size without focal abnormality. Adrenals/Urinary Tract: Adrenal glands are unremarkable. Kidneys are normal, without renal calculi, focal lesion, or hydronephrosis. Bladder is unremarkable. Stomach/Bowel: Stomach is within normal limits. The appendix is not seen but no inflammation is noted around cecum. No evidence of bowel wall thickening, distention, or inflammatory changes. Vascular/Lymphatic: Aortic atherosclerosis. No enlarged abdominal or pelvic lymph nodes. Reproductive: The uterus is normal. There is a 3.2 x 3.1 cm cyst in the right adnexa probably follicular cyst. Other: Minimal free fluid is identified in the pelvis. Musculoskeletal: Degenerative joint changes of L5-S1 are noted. IMPRESSION: 1. No acute abnormality identified in the abdomen and pelvis. 2. Small bilateral pleural effusions with atelectasis of lung bases. 3. 3.2 cm cyst in the right adnexa probably follicular cyst. Aortic Atherosclerosis (ICD10-I70.0). Electronically Signed   By: WAbelardo DieselM.D.   On: 08/29/2019 09:10   DG Chest Port 1 View  Result Date: 08/29/2019 CLINICAL DATA:  Fever EXAM: PORTABLE CHEST 1 VIEW COMPARISON:  08/22/2019 FINDINGS: Bilateral mild interstitial  thickening. Trace right pleural effusion. Postsurgical changes in the right upper lobe and perihilar region. Stable cardiomediastinal silhouette. No aggressive osseous lesion. IMPRESSION: Mild bilateral interstitial thickening which may reflect mild interstitial edema infection. Improved aeration compared with 08/22/2019. Postsurgical changes in the right lung. Electronically Signed   By: Kathreen Devoid   On: 08/29/2019 08:40      Cristina Gong Ginette Otto MD [facility]  If 7PM-7AM, please contact night-coverage   08/30/2019, 5:39 AM

## 2019-08-30 NOTE — ED Notes (Signed)
CRITICAL VALUE ALERT  Critical Value: Potassium 2.7 Date & Time Notied:  08/30/19 @ 0118 Provider Notified: Dr Reather Converse Orders Received/Actions taken: None yet

## 2019-08-30 NOTE — ED Provider Notes (Addendum)
[facility] EMERGENCY DEPARTMENT Provider Note   CSN: [id number] Arrival date & time: 08/29/19  2158     History Chief Complaint  Patient presents with  . Abdominal Pain    Elizabeth [name] is a 49 y.o. female.  Patient with history of COPD, HIV on antivirals, seizure disorder, lung cancer, recent admission for 2 weeks for overdose/intubation/respiratory failure, was in the emergency room yesterday for abdominal pain fever and diarrhea with strong recommendation for admission however patient left [place] presents back as she is not feeling any better and would like to be admitted.  Patient admits to having recurrent diarrhea multiple episodes nonbloody and low-grade fevers.  Patient had significant work-up done yesterday in the emergency room.  Patient feels generally malaise.  Patient on home oxygen.        Past Medical History:  Diagnosis Date  . Asthma    uses Albuterol inhaler daily as needed  . COPD (chronic obstructive pulmonary disease) ([place])   . Depression    takes Trazodone and Seroquel nightly  . Dizziness   . Dysphagia   . Headache(784.0)   . History of migraine    last one about 2wks ago  . HIV (human immunodeficiency virus infection) ([place])    takes Stribild daily  . Lung cancer ([place])   . Nocturia   . Seizures ([place])    takes Depakote daily-last one about 6 months ago as of 01/02/19  . Shortness of breath    sitting/lying/exertion  . Substance abuse ([place])   . Urinary frequency     Patient Active Problem List   Diagnosis Date Noted  . Polysubstance overdose 08/15/2019  . Overdose opiate ([place]) 08/15/2019  . Pain in joint, lower leg 10/21/2018  . Long-term use of high-risk medication 06/29/2018  . Lower extremity neuropathy 06/29/2018  . 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 Cyst of right ovary 01/29/2018  . Unspecified convulsions ([place]) 01/29/2018  . Leukocytosis 05/10/2017  . Lung cancer ([place]) 03/07/2017  . Polysubstance abuse ([place])  02/22/2017  . Cocaine abuse ([place]) 05/20/2016  . Generalized anxiety disorder 05/20/2016  . COPD exacerbation ([place]) 10/20/2013  . COPD (chronic obstructive pulmonary disease) ([place]) 10/20/2013  . 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 Hemoptysis 05/08/2013  . Dyspnea 04/30/2013  . Abnormal uterine bleeding (AUB) 04/13/2013  . Pulmonary mass 04/13/2013  . CARBUNCLE AND FURUNCLE OF TRUNK 01/12/2010  . DEPRESSION 11/29/2009  . BREAST MASS, LEFT 11/29/2009  . BACK PAIN, LUMBAR 11/29/2009  . Human immunodeficiency virus (HIV) disease ([place]) 10/26/2009  . SMOKER 10/26/2009  . COPD GOLD III 10/26/2009  . COCAINE ABUSE, HX OF 10/26/2009    Past Surgical History:  Procedure Laterality Date  . BREAST SURGERY Left    mass removed from left breast-fatty tissue  . CESAREAN SECTION  61yr ago  . VIDEO ASSISTED THORACOSCOPY (VATS)/WEDGE RESECTION Right 07/05/2013   Procedure: VIDEO ASSISTED THORACOSCOPY (VATS)/WEDGE RESECTION, Right Upper Lobectomy with lymph node disecction and OnQ placement;  Surgeon: SMelrose Nakayama MD;  [place] [facility]  Service: Thoracic;  Laterality: Right;  (R)VATS, WEDGE RESECTION, POSSIBLE LOBECTOMY, POSSIBLE CHEST WALL RESECTION  . VIDEO BRONCHOSCOPY Bilateral 05/05/2013   Procedure: VIDEO BRONCHOSCOPY WITH FLUORO;  Surgeon: MTanda Rockers MD;  Location: [facility];  Service: Cardiopulmonary;  Laterality: Bilateral;     OB History   No obstetric history on file.     Family History  Problem Relation Age of Onset  . Diabetes Mother   . CAD Mother   .  Hypertension Mother   . Hypertension Sister   . Lung cancer Maternal Aunt        smoked  . Allergies Sister   . Asthma Sister   . Lung cancer Maternal Uncle        never smoker  . Lung cancer Maternal Grandmother        never smoker    Social History   Tobacco Use  . Smoking status: Current Every Day Smoker    Packs/day: 4.00    Years: 24.00    Pack years: 96.00    Types: Cigarettes    Last  attempt to quit: 11/01/2013    Years since quitting: 5.8  . Smokeless tobacco: Never Used  Substance Use Topics  . Alcohol use: No    Alcohol/week: 0.0 standard drinks  . Drug use: No    Types: "Crack" cocaine    Comment: drug relapse 05/2014, clean x 3 months 09/08/14- 03/28/18 pt reports in recovery x 2 years at this time.     Home Medications Prior to Admission medications   Medication Sig Start Date End Date Taking? Authorizing Provider  albuterol (PROVENTIL HFA;VENTOLIN HFA) 108 (90 BASE) MCG/ACT inhaler Inhale 2 puffs into the lungs every 6 (six) hours as needed for wheezing or shortness of breath.    [provider]  albuterol (PROVENTIL) (2.5 MG/3ML) 0.083% nebulizer solution Take 6 mLs (5 mg total) by nebulization every 6 (six) hours as needed for wheezing or shortness of breath. 10/28/13   Kirichenko, [place] Rocker, PA-C  atorvastatin (LIPITOR) 20 MG tablet Take 20 mg by mouth daily.  03/19/18   [provider]  divalproex (DEPAKOTE) 250 MG DR tablet Take 250-500 mg by mouth 2 (two) times daily. 250 mg in the am and 500 in the pm    [provider]  doxycycline (VIBRAMYCIN) 100 MG capsule Take 1 capsule (100 mg total) by mouth 2 (two) times daily. 08/29/19   Noemi Chapel, MD  gabapentin (NEURONTIN) 300 MG capsule Take 300 mg by mouth 3 (three) times daily.  09/14/18   [provider]  GENVOYA 150-150-200-10 MG TABS tablet Take 1 tablet by mouth every morning.  03/19/18   [provider]  hydrOXYzine (ATARAX/VISTARIL) 25 MG tablet Take 10 mg by mouth 3 (three) times daily as needed.  05/20/18   [provider]  ipratropium-albuterol (DUONEB) 0.5-2.5 (3) MG/3ML SOLN Inhale 3 mLs into the lungs every 6 (six) hours as needed (for shortness of breath-wheezing).  03/19/18   [provider]  levETIRAcetam (KEPPRA) 750 MG tablet Take 750 mg by mouth 2 (two) times daily.  03/19/18   [provider]  mometasone-formoterol (DULERA)  100-5 MCG/ACT AERO Inhale 2 puffs into the lungs 2 (two) times daily.    [provider]  ondansetron (ZOFRAN) 4 MG tablet Take 4 mg by mouth every 8 (eight) hours as needed for nausea or vomiting.    [provider]  oxyCODONE ER (XTAMPZA ER) 13.5 MG C12A Take 13.5 mg by mouth daily as needed (pain).    [provider]  oxyCODONE-acetaminophen (PERCOCET/ROXICET) 5-325 MG tablet Take 1 tablet by mouth 4 (four) times daily as needed for moderate pain.    [provider]  pantoprazole (PROTONIX) 40 MG tablet Take 40 mg by mouth 2 (two) times daily. 11/08/18   [provider]  pregabalin (LYRICA) 75 MG capsule Take 1 capsule by mouth 3 (three) times daily. 10/01/18   [provider]  rOPINIRole (REQUIP) 1 MG  tablet Take 1 mg by mouth at bedtime.  03/19/18   [provider]  SPIRIVA HANDIHALER 18 MCG inhalation capsule Place 18 mcg into inhaler and inhale daily.  03/19/18   [provider]  sucralfate (CARAFATE) 1 g tablet Take 1 g by mouth 4 (four) times daily.     [provider]  SYMBICORT 160-4.5 MCG/ACT inhaler Inhale 2 puffs into the lungs 2 (two) times daily.  03/19/18   [provider]  valACYclovir (VALTREX) 500 MG tablet Take 500 mg by mouth 2 (two) times daily.    [provider]  venlafaxine (EFFEXOR) 37.5 MG tablet Take 1 tablet by mouth 2 (two) times a day. 10/21/18   [provider]  Vitamin D, Ergocalciferol, (DRISDOL) 1.25 MG (50000 UT) CAPS capsule Take 1 capsule by mouth once a week. 06/18/18   [provider]    Allergies    Penicillins, Ibuprofen, Morphine, Cariprazine, and Tramadol  Review of Systems   Review of Systems  Constitutional: Positive for appetite change, chills and fever.  HENT: Negative for congestion.   Eyes: Negative for visual disturbance.  Respiratory: Negative for shortness of breath.   Cardiovascular: Negative for chest pain.  Gastrointestinal:  Positive for abdominal pain, diarrhea and vomiting.  Genitourinary: Negative for dysuria and flank pain.  Musculoskeletal: Negative for back pain, neck pain and neck stiffness.  Skin: Negative for rash.  Neurological: Positive for light-headedness. Negative for headaches.    Physical Exam Updated Vital Signs BP 126/82 (BP Location: Left Arm)   Pulse 95   Temp (!) 100.4 F (38 C) (Oral)   Resp 18   Ht 5' (1.524 m)   Wt 66.2 kg   SpO2 95%   BMI 28.51 kg/m   Physical Exam Vitals and nursing note reviewed.  Constitutional:      Appearance: She is well-developed.  HENT:     Head: Normocephalic and atraumatic.     Comments: Dry mm Eyes:     General:        Right eye: No discharge.        Left eye: No discharge.     Conjunctiva/sclera: Conjunctivae normal.  Neck:     Trachea: No tracheal deviation.  Cardiovascular:     Rate and Rhythm: Normal rate and regular rhythm.  Pulmonary:     Effort: Pulmonary effort is normal.     Breath sounds: Normal breath sounds.  Abdominal:     General: There is no distension.     Palpations: Abdomen is soft.     Tenderness: There is abdominal tenderness in the periumbilical area. There is no guarding.  Musculoskeletal:     Cervical back: Normal range of motion and neck supple.  Skin:    General: Skin is warm.     Findings: No rash.  Neurological:     Mental Status: She is alert and oriented to person, place, and time.     ED Results / Procedures / Treatments   Labs (all labs ordered are listed, but only abnormal results are displayed) Labs Reviewed  CBC WITH DIFFERENTIAL/PLATELET - Abnormal; Notable for the following components:      Result Value   RBC 3.71 (*)    Hemoglobin 10.5 (*)    HCT 34.9 (*)    RDW 19.3 (*)    Abs Immature Granulocytes 0.16 (*)    All other components within normal limits  BASIC METABOLIC PANEL - Abnormal; Notable for the following components:   Potassium 2.7 (*)  CO2 33 (*)    Glucose, Bld 108 (*)     Creatinine, Ser 1.01 (*)    Calcium 8.0 (*)    All other components within normal limits  MAGNESIUM - Abnormal; Notable for the following components:   Magnesium 1.2 (*)    All other components within normal limits  LACTIC ACID, PLASMA  LACTIC ACID, PLASMA    EKG None  Radiology CT ABDOMEN PELVIS W CONTRAST  Result Date: 08/29/2019 CLINICAL DATA:  Sepsis. Abdomen pain. EXAM: CT ABDOMEN AND PELVIS WITH CONTRAST TECHNIQUE: Multidetector CT imaging of the abdomen and pelvis was performed using the standard protocol following bolus administration of intravenous contrast. CONTRAST:  175m OMNIPAQUE IOHEXOL 300 MG/ML  SOLN COMPARISON:  March 29, 2013 FINDINGS: Lower chest: There are small bilateral pleural effusions. Atelectasis of the lung bases are identified. The heart size is enlarged. Minimal pericardial effusion is noted. Hepatobiliary: No focal liver abnormality is seen. No gallstones, gallbladder wall thickening, or biliary dilatation. Pancreas: Unremarkable. No pancreatic ductal dilatation or surrounding inflammatory changes. Spleen: Normal in size without focal abnormality. Adrenals/Urinary Tract: Adrenal glands are unremarkable. Kidneys are normal, without renal calculi, focal lesion, or hydronephrosis. Bladder is unremarkable. Stomach/Bowel: Stomach is within normal limits. The appendix is not seen but no inflammation is noted around cecum. No evidence of bowel wall thickening, distention, or inflammatory changes. Vascular/Lymphatic: Aortic atherosclerosis. No enlarged abdominal or pelvic lymph nodes. Reproductive: The uterus is normal. There is a 3.2 x 3.1 cm cyst in the right adnexa probably follicular cyst. Other: Minimal free fluid is identified in the pelvis. Musculoskeletal: Degenerative joint changes of L5-S1 are noted. IMPRESSION: 1. No acute abnormality identified in the abdomen and pelvis. 2. Small bilateral pleural effusions with atelectasis of lung bases. 3. 3.2 cm cyst in the  right adnexa probably follicular cyst. Aortic Atherosclerosis (ICD10-I70.0). Electronically Signed   By: WAbelardo DieselM.D.   On: 08/29/2019 09:10   DG Chest Port 1 View  Result Date: 08/29/2019 CLINICAL DATA:  Fever EXAM: PORTABLE CHEST 1 VIEW COMPARISON:  08/22/2019 FINDINGS: Bilateral mild interstitial thickening. Trace right pleural effusion. Postsurgical changes in the right upper lobe and perihilar region. Stable cardiomediastinal silhouette. No aggressive osseous lesion. IMPRESSION: Mild bilateral interstitial thickening which may reflect mild interstitial edema infection. Improved aeration compared with 08/22/2019. Postsurgical changes in the right lung. Electronically Signed   By: HKathreen Devoid  On: 08/29/2019 08:40    Procedures .Critical Care Performed by: ZElnora Morrison MD Authorized by: ZElnora Morrison MD   Critical care provider statement:    Critical care time (minutes):  33   Critical care start time:  08/30/2019 12:50 AM   Critical care end time:  08/30/2019 1:23 AM   Critical care time was exclusive of:  Separately billable procedures and treating other patients and teaching time   Critical care was time spent personally by me on the following activities:  Evaluation of patient's response to treatment, examination of patient, ordering and performing treatments and interventions, ordering and review of laboratory studies, pulse oximetry, re-evaluation of patient's condition, ordering and review of radiographic studies and review of old charts Comments:     IV K and IV Magn, sepsis screening   (including critical care time)  Medications Ordered in ED Medications  oxyCODONE-acetaminophen (PERCOCET/ROXICET) 5-325 MG per tablet 1 tablet (has no administration in time range)  potassium chloride 10 mEq in 100 mL IVPB (has no administration in time range)  potassium chloride 10 mEq in 100 mL  IVPB (has no administration in time range)  sodium chloride 0.9 % bolus 500 mL (500 mLs  Intravenous New Bag/Given 08/30/19 0044)  acetaminophen (TYLENOL) tablet 1,000 mg (1,000 mg Oral Given 08/30/19 0020)    ED Course  I have reviewed the triage vital signs and the nursing notes.  Pertinent labs & imaging results that were available during my care of the patient were reviewed by me and considered in my medical decision making (see chart for details).    MDM Rules/Calculators/A&P                      Patient presents for second visit in 24 hours after leaving the emergency room [place].  Concern yesterday for possible sepsis with fever abdominal pain recurrent diarrhea and recent admission to the hospital.  Patient is now in agreement to come in the hospital as she clinically is not feeling well.  On arrival patient has fever 100.4 degrees, pulse 95, neurologically doing well.  Plan for repeat basic blood work to check white blood cell count, potassium, give IV fluid bolus.  Patient already had cultures obtained and CT abdomen pelvis results reviewed with no acute findings.  CBC reviewed white blood cell count normalized, hemoglobin 10.5.  Blood cultures had recently been sent.  Concern clinically for C. difficile or other infectious diarrhea.  Discussed with hospitalist for admission.  Potassium returned 2.7.  With recurrent diarrhea and symptomatic 2 rounds of IV potassium ordered.  Patient will be put on telemetry monitor and repeat EKG to check QT interval until admission.  Magnesium also low will order IV magnesium.    Final Clinical Impression(s) / ED Diagnoses Final diagnoses:  Fever in adult  Diarrhea of infectious origin  Hypokalemia    Rx / DC Orders ED Discharge Orders    None       Elnora Morrison, MD 08/30/19 1884    Elnora Morrison, MD 08/30/19 [id number]

## 2019-08-31 DIAGNOSIS — E876 Hypokalemia: Secondary | ICD-10-CM | POA: Diagnosis not present

## 2019-08-31 LAB — COMPREHENSIVE METABOLIC PANEL
ALT: 28 U/L (ref 0–44)
AST: 17 U/L (ref 15–41)
Albumin: 2.9 g/dL — ABNORMAL LOW (ref 3.5–5.0)
Alkaline Phosphatase: 70 U/L (ref 38–126)
Anion gap: 8 (ref 5–15)
BUN: <5 mg/dL — ABNORMAL LOW (ref 6–20)
CO2: 30 mmol/L (ref 22–32)
Calcium: 8.3 mg/dL — ABNORMAL LOW (ref 8.9–10.3)
Chloride: 102 mmol/L (ref 98–111)
Creatinine, Ser: 0.8 mg/dL (ref 0.44–1.00)
GFR calc Af Amer: >60 mL/min (ref >60)
GFR calc non Af Amer: >60 mL/min (ref >60)
Glucose, Bld: 96 mg/dL (ref 70–99)
Potassium: 3.6 mmol/L (ref 3.5–5.1)
Sodium: 140 mmol/L (ref 135–145)
Total Bilirubin: 0.7 mg/dL (ref 0.3–1.2)
Total Protein: 5.5 g/dL — ABNORMAL LOW (ref 6.5–8.1)

## 2019-08-31 LAB — CBC
HCT: 32.1 % — ABNORMAL LOW (ref 36.0–46.0)
Hemoglobin: 9.6 g/dL — ABNORMAL LOW (ref 12.0–15.0)
MCH: 27.8 pg (ref 26.0–34.0)
MCHC: 29.9 g/dL — ABNORMAL LOW (ref 30.0–36.0)
MCV: 93 fL (ref 80.0–100.0)
Platelets: 319 10*3/uL (ref 150–400)
RBC: 3.45 MIL/uL — ABNORMAL LOW (ref 3.87–5.11)
RDW: 18.8 % — ABNORMAL HIGH (ref 11.5–15.5)
WBC: 9 10*3/uL (ref 4.0–10.5)
nRBC: 0 % (ref 0.0–0.2)

## 2019-08-31 LAB — MAGNESIUM: Magnesium: 1.9 mg/dL (ref 1.7–2.4)

## 2019-08-31 MED ORDER — ONDANSETRON HCL 4 MG PO TABS: 4.0000 mg | ORAL_TABLET | Freq: Three times a day (TID) | ORAL | 0 refills | Status: DC | PRN

## 2019-08-31 MED ORDER — ONDANSETRON HCL 4 MG PO TABS: 4.0000 mg | ORAL_TABLET | Freq: Three times a day (TID) | ORAL | 0 refills | Status: AC | PRN

## 2019-08-31 NOTE — Discharge Summary (Signed)
Physician Discharge Summary  Elizabeth [name] DQQ:[id number] DOB: 1970-07-21 DOA: 08/29/2019  PCP: Edison Pace, MD  Admit date: 08/29/2019  Discharge date: 08/31/2019  Admitted From:Home  Disposition:  Home  Recommendations for Outpatient Follow-up:  1. Follow up with PCP in 1-2 weeks 2. Continue on prior opioid regimen as prescribed and follow-up closely 3. Zofran given as needed for any nausea or vomiting with no further diarrhea noted at this time 4. Continue other home medications as prior  Home Health: None  Equipment/Devices: None  Discharge Condition: Stable  CODE STATUS: Full  Diet recommendation: Heart Healthy  Brief/Interim Summary: Per HPI: Elizabeth [name] a 49 y.o.femalewith medical history significant ofHIV, COPD, seizure d/o, polysubstance abusewho presented to the ER with abdominal pain, diarrhea. Review of records shows patient had a recent admission from 08/15/2019-08/26/2019 with drug overdose and respiratory failure requiring mechanical ventilation. Patient started having nausea, epigastric pain, vomiting and diarrhea starting Friday, 08/27/2019. She reports she has had 4-5 episodes of vomiting since then and has had a decreased appetite and decreased ability to keep food down p.o. Has also had 7-8 episodes of watery diarrhea daily. Patient also reports increased weakness, difficulty with ambulation. Has had mild bilateral lower extremity edema. No hematemesis, melena, hematochezia.  Noted a temperature of 100.4 in the ER.  3/29: Patient has been admitted with likely gastroenteritis with associated severe electrolyte disturbances of hypokalemia and hypomagnesemia.  She will need continuing supplementation of magnesium and potassium with IV fluid hydration.  She continues to have some abdominal pain, but no significant diarrhea, nausea, or vomiting and is able to tolerate her diet.  Continue pain management with oral narcotics as needed.  Recheck levels of  electrolytes in a.m.  Anticipate discharge by a.m. if she is further improved.  Continue Rocephin and Flagyl for now.  3/30: Patient continues to have some mild abdominal pain along with some nausea, but no vomiting and is able to tolerate her diet.  She has no further diarrhea.  She appears stable for discharge and does not appear to need any further antibiotics.  It appears that much of her symptoms had developed after her recent admission for opioid overdose and more than likely she has been in some form of withdrawal which has caused her abdominal symptoms.  She will discharged with some Zofran as needed and continue the rest of her home medications including narcotics as previously prescribed.  Her magnesium and potassium levels have now stabilized after supplementation.  No other acute events noted throughout the course of this admission.  Discharge Diagnoses:  Principal Problem:   Hypokalemia Active Problems:   Human immunodeficiency virus (HIV) disease (HCC)   Depression   COPD (chronic obstructive pulmonary disease) ([place])   Lung cancer ([place])   Polysubstance abuse ([place])   Diarrhea  Principal discharge diagnosis: Severe hypokalemia/hypomagnesemia secondary to GI disturbances related to opioid withdrawal.  Discharge Instructions  Discharge Instructions    Diet - low sodium heart healthy   Complete by: As directed    Increase activity slowly   Complete by: As directed      Allergies as of 08/31/2019      Reactions   Penicillins Itching, Other (See Comments)   Ibuprofen Other (See Comments)   Morphine    Cariprazine Anxiety   Pt states she has hallucinations and suicidal thoughts when taking this medicine  Pt states she has hallucinations and suicidal thoughts when taking this medicine    Tramadol Rash, Other (See Comments)   headaches  Medication List    STOP taking these medications   doxycycline 100 MG capsule Commonly known as: VIBRAMYCIN     TAKE these  medications   albuterol 108 (90 Base) MCG/ACT inhaler Commonly known as: VENTOLIN HFA Inhale 2 puffs into the lungs every 6 (six) hours as needed for wheezing or shortness of breath.   albuterol (2.5 MG/3ML) 0.083% nebulizer solution Commonly known as: PROVENTIL Take 6 mLs (5 mg total) by nebulization every 6 (six) hours as needed for wheezing or shortness of breath.   atorvastatin 20 MG tablet Commonly known as: LIPITOR Take 20 mg by mouth daily.   divalproex 250 MG DR tablet Commonly known as: DEPAKOTE Take 250-500 mg by mouth 2 (two) times daily. 250 mg in the am and 500 in the pm   gabapentin 300 MG capsule Commonly known as: NEURONTIN Take 300 mg by mouth 3 (three) times daily.   Genvoya 150-150-200-10 MG Tabs tablet Generic drug: elvitegravir-cobicistat-emtricitabine-tenofovir Take 1 tablet by mouth every morning.   hydrOXYzine 25 MG tablet Commonly known as: ATARAX/VISTARIL Take 10 mg by mouth 3 (three) times daily as needed.   ipratropium-albuterol 0.5-2.5 (3) MG/3ML Soln Commonly known as: DUONEB Inhale 3 mLs into the lungs every 6 (six) hours as needed (for shortness of breath-wheezing).   levETIRAcetam 750 MG tablet Commonly known as: KEPPRA Take 750 mg by mouth 2 (two) times daily.   mometasone-formoterol 100-5 MCG/ACT Aero Commonly known as: DULERA Inhale 2 puffs into the lungs 2 (two) times daily.   ondansetron 4 MG tablet Commonly known as: ZOFRAN Take 1 tablet (4 mg total) by mouth every 8 (eight) hours as needed for nausea or vomiting.   oxyCODONE-acetaminophen 5-325 MG tablet Commonly known as: PERCOCET/ROXICET Take 1 tablet by mouth 4 (four) times daily as needed for moderate pain.   pantoprazole 40 MG tablet Commonly known as: PROTONIX Take 40 mg by mouth 2 (two) times daily.   pregabalin 75 MG capsule Commonly known as: LYRICA Take 1 capsule by mouth 3 (three) times daily.   rOPINIRole 1 MG tablet Commonly known as: REQUIP Take 1 mg by  mouth at bedtime.   Spiriva HandiHaler 18 MCG inhalation capsule Generic drug: tiotropium Place 18 mcg into inhaler and inhale daily.   sucralfate 1 g tablet Commonly known as: CARAFATE Take 1 g by mouth 4 (four) times daily.   Symbicort 160-4.5 MCG/ACT inhaler Generic drug: budesonide-formoterol Inhale 2 puffs into the lungs 2 (two) times daily.   valACYclovir 500 MG tablet Commonly known as: VALTREX Take 500 mg by mouth 2 (two) times daily.   venlafaxine 37.5 MG tablet Commonly known as: EFFEXOR Take 1 tablet by mouth 2 (two) times a day.   Vitamin D (Ergocalciferol) 1.25 MG (50000 UNIT) Caps capsule Commonly known as: DRISDOL Take 1 capsule by mouth once a week.   Xtampza ER 13.5 MG C12a Generic drug: oxyCODONE ER Take 13.5 mg by mouth daily as needed (pain).      Follow-up Information    Peace, Jim Desanctis, MD Follow up in 1 week(s).   Specialty: Family Medicine Contact information: [place] [address] [phone]          Allergies  Allergen Reactions  . Penicillins Itching and Other (See Comments)  . Ibuprofen Other (See Comments)  . Morphine   . Cariprazine Anxiety    Pt states she has hallucinations and suicidal thoughts when taking this medicine  Pt states she has hallucinations and suicidal thoughts  when taking this medicine    . Tramadol Rash and Other (See Comments)    headaches     Consultations:  None   Procedures/Studies: DG Chest 2 View  Result Date: 08/13/2019 CLINICAL DATA:  Mid chest pain. Shortness of breath. Weakness. EXAM: CHEST - 2 VIEW COMPARISON:  Radiograph 03/28/2018. CT size size 15 no interval cross-sectional imaging. FINDINGS: Postsurgical change in the right hemithorax. Associated postsurgical volume loss. Right hilar prominence which is likely vascular. Upper normal heart size. No acute airspace disease. No pleural effusion or pneumothorax. No acute osseous abnormalities are seen. IMPRESSION:  1. Upper normal heart size.  No acute pulmonary process. 2. Postsurgical change in the right hemithorax. Electronically Signed   By: Keith Rake M.D.   On: 08/13/2019 17:57   CT Head Wo Contrast  Result Date: 08/15/2019 CLINICAL DATA:  Mental status change. Drug overdose. Personal history of lung cancer, HIV, and seizures. EXAM: CT HEAD WITHOUT CONTRAST TECHNIQUE: Contiguous axial images were obtained from the base of the skull through the vertex without intravenous contrast. COMPARISON:  CT head without contrast 03/28/2018 FINDINGS: Brain: No acute infarct, hemorrhage, or mass lesion is present. The ventricles are of normal size. No significant extraaxial fluid collection is present. The brainstem and cerebellum are within normal limits. Vascular: Atherosclerotic calcifications are present within the cavernous internal carotid arteries. No hyperdense vessel is present. Skull: Calvarium is intact. No focal lytic or blastic lesions are present. No significant extracranial soft tissue lesion is present. Sinuses/Orbits: Extensive soft tissue prominence is present throughout the nasal cavity. Chronic wall thickening is present in the right maxillary sinus. There is some mucosal thickening throughout the ethmoid air cells. Frontal sinuses and sphenoid sinuses are clear. Mastoid air cells are clear. The globes and orbits are within normal limits. IMPRESSION: 1. Normal CT appearance of the brain. 2. Extensive soft tissue prominence within the nasal cavity may represent polyps or diffuse nasal mucosal swelling. 3. Chronic right maxillary sinus disease. Electronically Signed   By: San Morelle M.D.   On: 08/15/2019 15:48   MR BRAIN WO CONTRAST  Result Date: 08/21/2019 CLINICAL DATA:  Encephalopathy, possible overdose EXAM: MRI HEAD WITHOUT CONTRAST TECHNIQUE: Multiplanar, multiecho pulse sequences of the brain and surrounding structures were obtained without intravenous contrast. COMPARISON:  None.  FINDINGS: Motion artifact is present. Brain: Punctate focus of diffusion and T2 hyperintensity in the region of the right globus pallidus. There is no acute infarction or intracranial hemorrhage. There is no intracranial mass, mass effect, or edema. There is no hydrocephalus or extra-axial fluid collection. Vascular: Major vessel flow voids at the skull base are preserved. Skull and upper cervical spine: Normal marrow signal is preserved. Sinuses/Orbits: Paranasal sinuses are aerated. Orbits are unremarkable. Other: Sella is unremarkable.  Mastoid air cells are clear. IMPRESSION: Punctate focus of abnormal signal in the region of the right globus pallidus likely reflects hypoxic/anoxic injury. Electronically Signed   By: Macy Mis M.D.   On: 08/21/2019 19:47   CT ABDOMEN PELVIS W CONTRAST  Result Date: 08/29/2019 CLINICAL DATA:  Sepsis. Abdomen pain. EXAM: CT ABDOMEN AND PELVIS WITH CONTRAST TECHNIQUE: Multidetector CT imaging of the abdomen and pelvis was performed using the standard protocol following bolus administration of intravenous contrast. CONTRAST:  184m OMNIPAQUE IOHEXOL 300 MG/ML  SOLN COMPARISON:  March 29, 2013 FINDINGS: Lower chest: There are small bilateral pleural effusions. Atelectasis of the lung bases are identified. The heart size is enlarged. Minimal pericardial effusion is noted. Hepatobiliary: No focal liver  abnormality is seen. No gallstones, gallbladder wall thickening, or biliary dilatation. Pancreas: Unremarkable. No pancreatic ductal dilatation or surrounding inflammatory changes. Spleen: Normal in size without focal abnormality. Adrenals/Urinary Tract: Adrenal glands are unremarkable. Kidneys are normal, without renal calculi, focal lesion, or hydronephrosis. Bladder is unremarkable. Stomach/Bowel: Stomach is within normal limits. The appendix is not seen but no inflammation is noted around cecum. No evidence of bowel wall thickening, distention, or inflammatory changes.  Vascular/Lymphatic: Aortic atherosclerosis. No enlarged abdominal or pelvic lymph nodes. Reproductive: The uterus is normal. There is a 3.2 x 3.1 cm cyst in the right adnexa probably follicular cyst. Other: Minimal free fluid is identified in the pelvis. Musculoskeletal: Degenerative joint changes of L5-S1 are noted. IMPRESSION: 1. No acute abnormality identified in the abdomen and pelvis. 2. Small bilateral pleural effusions with atelectasis of lung bases. 3. 3.2 cm cyst in the right adnexa probably follicular cyst. Aortic Atherosclerosis (ICD10-I70.0). Electronically Signed   By: Abelardo Diesel M.D.   On: 08/29/2019 09:10   DG Chest Port 1 View  Result Date: 08/29/2019 CLINICAL DATA:  Fever EXAM: PORTABLE CHEST 1 VIEW COMPARISON:  08/22/2019 FINDINGS: Bilateral mild interstitial thickening. Trace right pleural effusion. Postsurgical changes in the right upper lobe and perihilar region. Stable cardiomediastinal silhouette. No aggressive osseous lesion. IMPRESSION: Mild bilateral interstitial thickening which may reflect mild interstitial edema infection. Improved aeration compared with 08/22/2019. Postsurgical changes in the right lung. Electronically Signed   By: Kathreen Devoid   On: 08/29/2019 08:40   DG Chest Port 1 View  Result Date: 08/22/2019 CLINICAL DATA:  Acute respiratory failure. EXAM: PORTABLE CHEST 1 VIEW COMPARISON:  Chest radiograph 08/19/2019 FINDINGS: An enteric tube passes below the level of the left hemidiaphragm with tip excluded from the field of view. The cardiomediastinal silhouette is unchanged. Redemonstrated postoperative changes to the perihilar right lung. Again demonstrated are bilateral basal predominant interstitial and ill-defined airspace opacities. Persistent small bilateral pleural effusions. No evidence of pneumothorax. No acute bony abnormality. IMPRESSION: An enteric tube passes below the level of the left hemidiaphragm with tip excluded from the field of view. Similar  appearance of bilateral basal predominant interstitial and ill-defined airspace opacities with small bilateral pleural effusions as compared to 08/19/2019. Electronically Signed   By: Kellie Simmering DO   On: 08/22/2019 07:20   DG Chest Port 1 View  Result Date: 08/19/2019 CLINICAL DATA:  Respiratory failure EXAM: PORTABLE CHEST 1 VIEW COMPARISON:  08/18/2019 FINDINGS: Interval endotracheal and esophagogastric extubation. No significant change in bilateral, bibasilar predominant heterogeneous and interstitial airspace opacity and small bilateral pleural effusions. Postoperative findings about the perihilar right lung. Cardiomegaly. IMPRESSION: 1. Interval extubation. 2. No significant change in bilateral heterogeneous and interstitial airspace opacity and small bilateral pleural effusions. Electronically Signed   By: Eddie Candle M.D.   On: 08/19/2019 09:26   DG CHEST PORT 1 VIEW  Result Date: 08/18/2019 CLINICAL DATA:  Acute respiratory failure with hypoxia EXAM: PORTABLE CHEST 1 VIEW COMPARISON:  Two days ago FINDINGS: Worsening infiltrates in the lower lungs with small bilateral pleural effusion. Normal heart size for technique. Postoperative right lung. Endotracheal tube with tip 17 mm above the carina. The enteric tube reaches the stomach. IMPRESSION: Worsening bilateral infiltrates. Electronically Signed   By: Monte Fantasia M.D.   On: 08/18/2019 07:23   DG CHEST PORT 1 VIEW  Result Date: 08/16/2019 CLINICAL DATA:  Hypoxia,resp failure ,ETT present EXAM: PORTABLE CHEST - 1 VIEW COMPARISON:  the previous day's study FINDINGS: Endotracheal tube,  nasogastric tube stable position. Postop changes in the right lung. Relatively low lung volumes. No definite infiltrate or overt edema. Heart size and mediastinal contours are within normal limits. No pneumothorax. No effusion. Visualized bones unremarkable. IMPRESSION: Low lung volumes. No acute disease. Electronically Signed   By: Lucrezia Europe M.D.   On:  08/16/2019 10:49   DG Chest Portable 1 View  Result Date: 08/15/2019 CLINICAL DATA:  Endotracheal tube and orogastric tube placement. EXAM: PORTABLE CHEST 1 VIEW COMPARISON:  August 13, 2019 FINDINGS: An endotracheal tube is seen with its distal tip approximately 1.7 cm from the carina. A nasogastric tube is noted with its distal tip overlying the expected region of the gastric antrum. Radiopaque surgical clips are again seen overlying the lateral aspect of the upper right lung. Additional postoperative changes seen overlying the mid right lung. There is no evidence of acute infiltrate, pleural effusion or pneumothorax. The heart size and mediastinal contours are within normal limits. The visualized skeletal structures are unremarkable. IMPRESSION: 1. Endotracheal tube and nasogastric tube in good position. 2. No acute cardiopulmonary disease. Electronically Signed   By: Virgina Norfolk M.D.   On: 08/15/2019 15:17   DG Swallowing Func-Speech Pathology  Result Date: 08/23/2019 Objective Swallowing Evaluation: Type of Study: MBS-Modified Barium Swallow Study  Patient Details Name: Judy Pollman MRN: [id number] Date of Birth: 09/18/1970 Today's Date: 08/23/2019 Time: SLP Start Time (ACUTE ONLY): 1342 -SLP Stop Time (ACUTE ONLY): 1403 SLP Time Calculation (min) (ACUTE ONLY): 21 min Past Medical History: Past Medical History: Diagnosis Date . Asthma   uses Albuterol inhaler daily as needed . COPD (chronic obstructive pulmonary disease) ([place])  . Depression   takes Trazodone and Seroquel nightly . Dizziness  . Dysphagia  . Headache(784.0)  . History of migraine   last one about 2wks ago . HIV (human immunodeficiency virus infection) ([place])   takes Stribild daily . Lung cancer ([place])  . Nocturia  . Seizures ([place])   takes Depakote daily-last one about 6 months ago as of 01/02/19 . Shortness of breath   sitting/lying/exertion . Substance abuse ([place])  . Urinary frequency  Past Surgical History: Past Surgical History: Procedure  Laterality Date . BREAST SURGERY Left   mass removed from left breast-fatty tissue . CESAREAN SECTION  24yr ago . VIDEO ASSISTED THORACOSCOPY (VATS)/WEDGE RESECTION Right 07/05/2013  Procedure: VIDEO ASSISTED THORACOSCOPY (VATS)/WEDGE RESECTION, Right Upper Lobectomy with lymph node disecction and OnQ placement;  Surgeon: SMelrose Nakayama MD;  [facility]  Service: Thoracic;  Laterality: Right;  (R)VATS, WEDGE RESECTION, POSSIBLE LOBECTOMY, POSSIBLE CHEST WALL RESECTION . VIDEO BRONCHOSCOPY Bilateral 05/05/2013  Procedure: VIDEO BRONCHOSCOPY WITH FLUORO;  Surgeon: MTanda Rockers MD;  Location: [facility];  Service: Cardiopulmonary;  Laterality: Bilateral; HPI: 49year old woman with a history of HIV, COPD, seizure d/o, polysubstance abuse, with new AMS. Daughter called EMS for suspected overdose, found pill bottles half empty after new prescription filled 3/8/2Benzos and opiates pos on urine tox. Intubated 3/14-3/17. Acute Hypoxemic Respiratory Insufficiency - multifactorial in setting AMS, suspected Aspiration Pneumonitis with associated Bronchospasm; acute metabloic encephalopathy, AKI secondary to rhabdomyolosis, acute liver failure.  No data recorded Assessment / Plan / Recommendation CHL IP CLINICAL IMPRESSIONS 08/23/2019 Clinical Impression Pt exhibited mild oropharyngeal impairments marked by laryngeal penetration with thin due to decreased timing of laryngeal protection. She has dentures at home however last week daughter reported she never wears them when eating. She exhibited prolonged mastication, bolus formation and transit with solid texture. Pharyngeal phase  marked by minimally delayed epiglottic deflection with thin barium resulting in flash, momentary penetration during swallow. At end of study, she regurgitated mild amount to pharynx and trace amount was penetrated onto her vocal cords. Thin liquids via cup or straw with small sips was safe during study. Recommend Dys 2 texture with  suspected progression soon to higher texture as endurance continues to improve. Small sips with cup or straw allowed, meds whole in puree and full assist with feeding and strategies.       SLP Visit Diagnosis Dysphagia, oropharyngeal phase (R13.12) Attention and concentration deficit following -- Frontal lobe and executive function deficit following -- Impact on safety and function Mild aspiration risk;Moderate aspiration risk   CHL IP TREATMENT RECOMMENDATION 08/23/2019 Treatment Recommendations Therapy as outlined in treatment plan below   Prognosis 08/23/2019 Prognosis for Safe Diet Advancement Good Barriers to Reach Goals Cognitive deficits Barriers/Prognosis Comment -- CHL IP DIET RECOMMENDATION 08/23/2019 SLP Diet Recommendations Dysphagia 2 (Fine chop) solids;Thin liquid Liquid Administration via Cup;Straw Medication Administration Whole meds with puree Compensations Minimize environmental distractions;Slow rate;Small sips/bites;Clear throat intermittently Postural Changes Seated upright at 90 degrees   CHL IP OTHER RECOMMENDATIONS 08/23/2019 Recommended Consults -- Oral Care Recommendations Oral care BID Other Recommendations --   CHL IP FOLLOW UP RECOMMENDATIONS 08/23/2019 Follow up Recommendations 24 hour supervision/assistance   CHL IP FREQUENCY AND DURATION 08/23/2019 Speech Therapy Frequency (ACUTE ONLY) min 2x/week Treatment Duration 2 weeks      CHL IP ORAL PHASE 08/23/2019 Oral Phase Impaired Oral - Pudding Teaspoon -- Oral - Pudding Cup -- Oral - Honey Teaspoon -- Oral - Honey Cup -- Oral - Nectar Teaspoon -- Oral - Nectar Cup Lingual/palatal residue Oral - Nectar Straw WFL Oral - Thin Teaspoon -- Oral - Thin Cup Lingual/palatal residue Oral - Thin Straw Lingual/palatal residue Oral - Puree Delayed oral transit Oral - Mech Soft -- Oral - Regular Other (Comment) Oral - Multi-Consistency -- Oral - Pill -- Oral Phase - Comment --  CHL IP PHARYNGEAL PHASE 08/23/2019 Pharyngeal Phase Impaired Pharyngeal- Pudding  Teaspoon -- Pharyngeal -- Pharyngeal- Pudding Cup -- Pharyngeal -- Pharyngeal- Honey Teaspoon -- Pharyngeal -- Pharyngeal- Honey Cup -- Pharyngeal -- Pharyngeal- Nectar Teaspoon -- Pharyngeal -- Pharyngeal- Nectar Cup WFL Pharyngeal -- Pharyngeal- Nectar Straw WFL Pharyngeal -- Pharyngeal- Thin Teaspoon -- Pharyngeal -- Pharyngeal- Thin Cup [facility] Pharyngeal Material does not enter airway Pharyngeal- Thin Straw Penetration/Aspiration during swallow Pharyngeal Material enters airway, remains ABOVE vocal cords then ejected out;Material enters airway, CONTACTS cords and then ejected out;Material enters airway, CONTACTS cords and not ejected out Pharyngeal- Puree WFL Pharyngeal -- Pharyngeal- Mechanical Soft -- Pharyngeal -- Pharyngeal- Regular WFL Pharyngeal -- Pharyngeal- Multi-consistency -- Pharyngeal -- Pharyngeal- Pill -- Pharyngeal -- Pharyngeal Comment --  CHL IP CERVICAL ESOPHAGEAL PHASE 08/23/2019 Cervical Esophageal Phase WFL Pudding Teaspoon -- Pudding Cup -- Honey Teaspoon -- Honey Cup -- Nectar Teaspoon -- Nectar Cup -- Nectar Straw -- Thin Teaspoon -- Thin Cup -- Thin Straw -- Puree -- Mechanical Soft -- Regular -- Multi-consistency -- Pill -- Cervical Esophageal Comment -- [place] Siren 08/23/2019, 4:31 PM Orbie Pyo Litaker M.Ed Actor Pager [phone] Office [phone]              EEG adult  Result Date: 08/20/2019 Lora Havens, MD     08/20/2019  2:29 PM Patient Name: Stpehanie Montroy MRN: [id number] Epilepsy Attending: Lora Havens Referring Physician/Provider: Dr. Dyann Ruddle Date: 08/20/2019 Duration: 24.52 minutes Patient history: 49 year old female  with history of seizures due to polysubstance abuse presented with altered mental status.  EEG to evaluate for seizures. Level of alertness: Lethargic AEDs during EEG study: Keppra, Ativan Technical aspects: This EEG study was done with scalp electrodes positioned according to the 10-20 International system of  electrode placement. Electrical activity was acquired at a sampling rate of 500Hz  and reviewed with a high frequency filter of 70Hz  and a low frequency filter of 1Hz . EEG data were recorded continuously and digitally stored. Description: EEG showed continuous generalized 3 to 7 Hz theta-delta slowing as well as intermittent 2 to 3-second.'s of generalized EEG attenuation.  Hyperventilation and photic stimulation were not performed. Abnormality -Continuous slow, generalized -Background attenuation, generalized IMPRESSION: This study is suggestive of severe diffuse encephalopathy, nonspecific etiology. No seizures or epileptiform discharges were seen throughout the recording. Lora Havens   ECHOCARDIOGRAM COMPLETE  Result Date: 08/16/2019    ECHOCARDIOGRAM REPORT   Patient Name:   TASHEMA TILLER Date of Exam: 08/16/2019 Medical Rec #:  [id number]   Height:       60.0 in Accession #:    [id number]  Weight:       145.5 lb Date of Birth:  [birth year]   BSA:          1.631 m Patient Age:    12 years    BP:           114/87 mmHg Patient Gender: F           HR:           99 bpm. Exam Location:  Inpatient Procedure: 2D Echo, Cardiac Doppler and Color Doppler Indications:    Acute Respiratory Insufficiency 518.82  History:        Patient has no prior history of Echocardiogram examinations.                 COPD, Signs/Symptoms:Dyspnea; Risk Factors:Current Smoker.  Sonographer:    Vickie Epley RDCS Referring Phys: [id number] [facility]  Sonographer Comments: Echo performed with patient supine and on artificial respirator. IMPRESSIONS  1. Endocardial border defnition is poor. Systolic function appears normal.. Left ventricular ejection fraction, by estimation, is 55 to 60%. The left ventricle has normal function. The left ventricle has no regional wall motion abnormalities. Left ventricular diastolic parameters were normal.  2. Right ventricular systolic function is normal. The right ventricular size is normal. There is  moderately elevated pulmonary artery systolic pressure.  3. The mitral valve is normal in structure. Trivial mitral valve regurgitation. No evidence of mitral stenosis.  4. The aortic valve is tricuspid. Aortic valve regurgitation is not visualized. No aortic stenosis is present.  5. The inferior vena cava is dilated in size with <50% respiratory variability, suggesting right atrial pressure of 15 mmHg. FINDINGS  Left Ventricle: Endocardial border defnition is poor. Systolic function appears normal. Left ventricular ejection fraction, by estimation, is 55 to 60%. The left ventricle has normal function. The left ventricle has no regional wall motion abnormalities. The left ventricular internal cavity size was normal in size. There is no left ventricular hypertrophy. Left ventricular diastolic parameters were normal. Right Ventricle: The right ventricular size is normal. No increase in right ventricular wall thickness. Right ventricular systolic function is normal. There is moderately elevated pulmonary artery systolic pressure. The tricuspid regurgitant velocity is 2.81 m/s, and with an assumed right atrial pressure of 15 mmHg, the estimated right ventricular systolic pressure is 62.9 mmHg. Left Atrium: Left atrial size was normal in size.  Right Atrium: Right atrial size was normal in size. Pericardium: There is no evidence of pericardial effusion. Mitral Valve: The mitral valve is normal in structure. Normal mobility of the mitral valve leaflets. Trivial mitral valve regurgitation. No evidence of mitral valve stenosis. Tricuspid Valve: The tricuspid valve is normal in structure. Tricuspid valve regurgitation is mild . No evidence of tricuspid stenosis. Aortic Valve: The aortic valve is tricuspid. Aortic valve regurgitation is not visualized. No aortic stenosis is present. Pulmonic Valve: The pulmonic valve was normal in structure. Pulmonic valve regurgitation is not visualized. No evidence of pulmonic stenosis.  Aorta: The aortic root is normal in size and structure. Venous: The inferior vena cava is dilated in size with less than 50% respiratory variability, suggesting right atrial pressure of 15 mmHg. IAS/Shunts: No atrial level shunt detected by color flow Doppler. Additional Comments: A is visualized in the inferior vena cava.  LEFT VENTRICLE PLAX 2D LVIDd:         4.30 cm     Diastology LVIDs:         3.40 cm     LV e' lateral:   14.30 cm/s LV PW:         0.80 cm     LV E/e' lateral: 4.9 LV IVS:        0.80 cm     LV e' medial:    9.79 cm/s LVOT diam:     2.10 cm     LV E/e' medial:  7.2 LV SV:         78 LV SV Index:   48 LVOT Area:     3.46 cm  LV Volumes (MOD) LV vol d, MOD A2C: 89.0 ml LV vol d, MOD A4C: 73.2 ml LV vol s, MOD A2C: 40.7 ml LV vol s, MOD A4C: 42.8 ml LV SV MOD A2C:     48.3 ml LV SV MOD A4C:     73.2 ml LV SV MOD BP:      39.5 ml RIGHT VENTRICLE RV S prime:     16.60 cm/s TAPSE (M-mode): 1.8 cm LEFT ATRIUM             Index       RIGHT ATRIUM           Index LA diam:        3.00 cm 1.84 cm/m  RA Area:     14.00 cm LA Vol (A2C):   28.9 ml 17.72 ml/m RA Volume:   33.10 ml  20.30 ml/m LA Vol (A4C):   22.9 ml 14.04 ml/m LA Biplane Vol: 26.2 ml 16.07 ml/m  AORTIC VALVE LVOT Vmax:   132.00 cm/s LVOT Vmean:  89.500 cm/s LVOT VTI:    0.225 m  AORTA Ao Root diam: 2.60 cm MITRAL VALVE               TRICUSPID VALVE MV Area (PHT): 3.93 cm    TR Peak grad:   31.6 mmHg MV Decel Time: 193 msec    TR Vmax:        281.00 cm/s MR Peak grad: 77.8 mmHg MR Vmax:      441.00 cm/s  SHUNTS MV E velocity: 70.70 cm/s  Systemic VTI:  0.22 m MV A velocity: 72.80 cm/s  Systemic Diam: 2.10 cm MV E/A ratio:  0.97 Skeet Latch MD Electronically signed by Skeet Latch MD Signature Date/Time: 08/16/2019/2:14:43 PM    Final    US LIVER DOPPLER  Result Date: 08/16/2019 CLINICAL  DATA:  Transaminitis EXAM: DUPLEX ULTRASOUND OF LIVER TECHNIQUE: Color and duplex Doppler ultrasound was performed to evaluate the hepatic  in-flow and out-flow vessels. COMPARISON:  PET-CT 05/19/2013 FINDINGS: Portal Vein 1.3 cm diameter. No occlusion or thrombus. Velocities (all hepatopetal): Main:  39-46 cm/sec Right:  31 cm/sec Left:  34 cm/sec Hepatic Vein Velocities (all hepatofugal): Right:  11 cm/sec Middle:  15 cm/sec Left:  11 cm/sec SMV: 23 cm/sec Intrahepatic IVC patent, velocity: 15 cm/sec Hepatic Artery Velocity:  67 cm/sec Splenic Vein Velocity:  34 cm/sec.  No occlusion or thrombus. Spleen 16.1 x 4.2 x 5 cm (volume = 200 cm^3). Varices: None identified Ascites: None No liver lesion identified.  No biliary ductal dilatation. Gallbladder wall thickening up to 0.7 cm with trace pericholecystic fluid. Common bile duct is nondilated measuring 4 mm diameter. IMPRESSION: 1. Unremarkable hepatic vascular Doppler evaluation. 2. Nonspecific gallbladder wall thickening with trace pericholecystic fluid. Electronically Signed   By: Lucrezia Europe M.D.   On: 08/16/2019 12:32     Discharge Exam: Vitals:   08/31/19 0537 08/31/19 0746  BP: (!) 146/96   Pulse: (!) 116   Resp:    Temp:    SpO2: (!) 72% 90%   Vitals:   08/30/19 2115 08/31/19 0536 08/31/19 0537 08/31/19 0746  BP: (!) 165/111 (!) 157/103 (!) 146/96   Pulse: 93 (!) 116 (!) 116   Resp: 16 16    Temp: 98.6 F (37 C) 98.5 F (36.9 C)    TempSrc: Oral Oral    SpO2: 92% (!) 75% (!) 72% 90%  Weight:      Height:        General: Pt is alert, awake, not in acute distress Cardiovascular: RRR, S1/S2 +, no rubs, no gallops Respiratory: CTA bilaterally, no wheezing, no rhonchi Abdominal: Soft, NT, ND, bowel sounds + Extremities: no edema, no cyanosis    The results of significant diagnostics from this hospitalization (including imaging, microbiology, ancillary and laboratory) are listed below for reference.     Microbiology: Recent Results (from the past 240 hour(s))  Urine Culture     Status: Abnormal   Collection Time: 08/29/19  5:51 AM   Specimen: Urine, Clean  Catch  Result Value Ref Range Status   Specimen Description   Final    URINE, CLEAN CATCH Performed at [facility], [address], [place], [place] [address]    Special Requests   Final    NONE Performed at [facility], [address] [place], [place] [address]    Culture MULTIPLE SPECIES PRESENT, SUGGEST RECOLLECTION (A)  Final   Report Status 08/30/2019 FINAL  Final  Respiratory Panel by RT PCR (Flu A&B, Covid) - Nasopharyngeal Swab     Status: None   Collection Time: 08/29/19  5:51 AM   Specimen: Nasopharyngeal Swab  Result Value Ref Range Status   SARS Coronavirus 2 by RT PCR NEGATIVE NEGATIVE Final    Comment: (NOTE) SARS-CoV-2 target nucleic acids are NOT DETECTED. The SARS-CoV-2 RNA is generally detectable in upper respiratoy specimens during the acute phase of infection. The lowest concentration of SARS-CoV-2 viral copies this assay can detect is 131 copies/mL. A negative result does not preclude SARS-Cov-2 infection and should not be used as the sole basis for treatment or other patient management decisions. A negative result may occur with  improper specimen collection/handling, submission of specimen other than nasopharyngeal swab, presence of viral mutation(s) within the areas targeted by this assay, and inadequate number of viral copies (<  131 copies/mL). A negative result must be combined with clinical observations, patient history, and epidemiological information. The expected result is Negative. Fact Sheet for Patients:  PinkCheek.be Fact Sheet for Healthcare Providers:  GravelBags.it This test is not yet ap proved or cleared by the [place] FDA and  has been authorized for detection and/or diagnosis of SARS-CoV-2 by FDA under an Emergency Use Authorization (EUA). This EUA will remain  in effect (meaning this test can be used) for the duration of the COVID-19 declaration under Section 564(b)(1) of  the Act, 21 U.S.C. section 360bbb-3(b)(1), unless the authorization is terminated or revoked sooner.    Influenza A by PCR NEGATIVE NEGATIVE Final   Influenza B by PCR NEGATIVE NEGATIVE Final    Comment: (NOTE) The Xpert Xpress SARS-CoV-2/FLU/RSV assay is intended as an aid in  the diagnosis of influenza from Nasopharyngeal swab specimens and  should not be used as a sole basis for treatment. Nasal washings and  aspirates are unacceptable for Xpert Xpress SARS-CoV-2/FLU/RSV  testing. Fact Sheet for Patients: PinkCheek.be Fact Sheet for Healthcare Providers: GravelBags.it This test is not yet approved or cleared by the [place] FDA and  has been authorized for detection and/or diagnosis of SARS-CoV-2 by  FDA under an Emergency Use Authorization (EUA). This EUA will remain  in effect (meaning this test can be used) for the duration of the  Covid-19 declaration under Section 564(b)(1) of the Act, 21  U.S.C. section 360bbb-3(b)(1), unless the authorization is  terminated or revoked. Performed at [facility], [address] [place], [place] [address]   Blood culture (routine x 2)     Status: None (Preliminary result)   Collection Time: 08/29/19  6:04 AM   Specimen: BLOOD  Result Value Ref Range Status   Specimen Description   Final    BLOOD RIGHT ANTECUBITAL Performed at [facility] [address] [place], [place] [address]    Special Requests   Final    Blood Culture adequate volume BOTTLES DRAWN AEROBIC AND ANAEROBIC Performed at [facility] [address], [place], [place] [address]    Culture   Final    NO GROWTH 2 DAYS Performed at [facility], [address], [place], [place] [address]    Report Status PENDING  Incomplete  Blood culture (routine x 2)     Status: None (Preliminary result)   Collection Time: 08/29/19  6:05 AM   Specimen: BLOOD  Result Value Ref Range  Status   Specimen Description   Final    BLOOD LEFT ANTECUBITAL Performed at [place] [address] [place], [place] [address]    Special Requests   Final    Blood Culture adequate volume BOTTLES DRAWN AEROBIC AND ANAEROBIC Performed at [facility] [address], [place], [place] [address]    Culture   Final    NO GROWTH 2 DAYS Performed at [facility], [address] [place], [place] [address]    Report Status PENDING  Incomplete     Labs: BNP (last 3 results) Recent Labs    08/13/19 1857 08/30/19 0631  BNP 233.0* 932.3*   Basic Metabolic Panel: Recent Labs  Lab 08/26/19 0217 08/29/19 0604 08/30/19 0045 08/30/19 1353 08/31/19 0457  NA 137 140 140 139 140  K 3.0* 2.7* 2.7* 2.9* 3.6  CL 108 99 100 99 102  CO2 22 31 33* 30 30  GLUCOSE 110* 107* 108* 135* 96  BUN 13 6  6 5* <5*  CREATININE 0.81 0.95 1.01* 0.99 0.80  CALCIUM 7.7* 8.2* 8.0* 7.9* 8.3*  MG  --   --  1.2* 1.5* 1.9   Liver Function Tests: Recent Labs  Lab 08/25/19 0257 08/26/19 0217 08/29/19 0604 08/31/19 0457  AST 21 20 20 17   ALT 75* 60* 43 28  ALKPHOS 63 65 85 70  BILITOT 0.7 0.7 0.6 0.7  PROT 4.9* 4.7* 6.3* 5.5*  ALBUMIN 2.4* 2.3* 3.3* 2.9*   Recent Labs  Lab 08/29/19 0604  LIPASE 23   No results for input(s): AMMONIA in the last 168 hours. CBC: Recent Labs  Lab 08/25/19 0257 08/26/19 0217 08/29/19 0604 08/30/19 0045 08/31/19 0457  WBC 9.5 7.2 12.0* 9.7 9.0  NEUTROABS  --   --  8.5* 5.5  --   HGB 8.7* 8.7* 10.3* 10.5* 9.6*  HCT 28.4* 28.5* 34.8* 34.9* 32.1*  MCV 90.2 90.5 93.5 94.1 93.0  PLT 300 235 310 276 319   Cardiac Enzymes: No results for input(s): CKTOTAL, CKMB, CKMBINDEX, TROPONINI in the last 168 hours. BNP: Invalid input(s): POCBNP CBG: Recent Labs  Lab 08/25/19 2230 08/25/19 2356 08/26/19 0417 08/26/19 0828 08/26/19 1206  GLUCAP 170* 196* 124* 107* 119*   D-Dimer No results for input(s): DDIMER in the last  72 hours. Hgb A1c No results for input(s): HGBA1C in the last 72 hours. Lipid Profile No results for input(s): CHOL, HDL, LDLCALC, TRIG, CHOLHDL, LDLDIRECT in the last 72 hours. Thyroid function studies Recent Labs    08/30/19 0637  TSH 2.791   Anemia work up No results for input(s): VITAMINB12, FOLATE, FERRITIN, TIBC, IRON, RETICCTPCT in the last 72 hours. Urinalysis    Component Value Date/Time   COLORURINE YELLOW 08/29/2019 0542   APPEARANCEUR CLEAR 08/29/2019 0542   LABSPEC 1.009 08/29/2019 0542   PHURINE 6.0 08/29/2019 0542   GLUCOSEU NEGATIVE 08/29/2019 0542   GLUCOSEU NEG mg/dL 11/15/2009 1840   HGBUR MODERATE (A) 08/29/2019 0542   BILIRUBINUR NEGATIVE 08/29/2019 0542   KETONESUR NEGATIVE 08/29/2019 0542   PROTEINUR NEGATIVE 08/29/2019 0542   UROBILINOGEN 1.0 07/08/2013 1210   NITRITE NEGATIVE 08/29/2019 0542   LEUKOCYTESUR NEGATIVE 08/29/2019 0542   Sepsis Labs Invalid input(s): PROCALCITONIN,  WBC,  LACTICIDVEN Microbiology Recent Results (from the past 240 hour(s))  Urine Culture     Status: Abnormal   Collection Time: 08/29/19  5:51 AM   Specimen: Urine, Clean Catch  Result Value Ref Range Status   Specimen Description   Final    URINE, CLEAN CATCH Performed at [facility], [address] [place], [place] [address]    Special Requests   Final    NONE Performed at [facility], [address] [place], [place] [address]    Culture MULTIPLE SPECIES PRESENT, SUGGEST RECOLLECTION (A)  Final   Report Status 08/30/2019 FINAL  Final  Respiratory Panel by RT PCR (Flu A&B, Covid) - Nasopharyngeal Swab     Status: None   Collection Time: 08/29/19  5:51 AM   Specimen: Nasopharyngeal Swab  Result Value Ref Range Status   SARS Coronavirus 2 by RT PCR NEGATIVE NEGATIVE Final    Comment: (NOTE) SARS-CoV-2 target nucleic acids are NOT DETECTED. The SARS-CoV-2 RNA is generally detectable in upper respiratoy specimens during the acute phase of infection. The  lowest concentration of SARS-CoV-2 viral copies this assay can detect is 131 copies/mL. A negative result does not preclude SARS-Cov-2 infection and should not be used as the sole basis for treatment or other  patient management decisions. A negative result may occur with  improper specimen collection/handling, submission of specimen other than nasopharyngeal swab, presence of viral mutation(s) within the areas targeted by this assay, and inadequate number of viral copies (<131 copies/mL). A negative result must be combined with clinical observations, patient history, and epidemiological information. The expected result is Negative. Fact Sheet for Patients:  PinkCheek.be Fact Sheet for Healthcare Providers:  GravelBags.it This test is not yet ap proved or cleared by the [place] FDA and  has been authorized for detection and/or diagnosis of SARS-CoV-2 by FDA under an Emergency Use Authorization (EUA). This EUA will remain  in effect (meaning this test can be used) for the duration of the COVID-19 declaration under Section 564(b)(1) of the Act, 21 U.S.C. section 360bbb-3(b)(1), unless the authorization is terminated or revoked sooner.    Influenza A by PCR NEGATIVE NEGATIVE Final   Influenza B by PCR NEGATIVE NEGATIVE Final    Comment: (NOTE) The Xpert Xpress SARS-CoV-2/FLU/RSV assay is intended as an aid in  the diagnosis of influenza from Nasopharyngeal swab specimens and  should not be used as a sole basis for treatment. Nasal washings and  aspirates are unacceptable for Xpert Xpress SARS-CoV-2/FLU/RSV  testing. Fact Sheet for Patients: PinkCheek.be Fact Sheet for Healthcare Providers: GravelBags.it This test is not yet approved or cleared by the [place] FDA and  has been authorized for detection and/or diagnosis of SARS-CoV-2 by  FDA under an Emergency  Use Authorization (EUA). This EUA will remain  in effect (meaning this test can be used) for the duration of the  Covid-19 declaration under Section 564(b)(1) of the Act, 21  U.S.C. section 360bbb-3(b)(1), unless the authorization is  terminated or revoked. Performed at [facility], [address], [place], [place] [address]   Blood culture (routine x 2)     Status: None (Preliminary result)   Collection Time: 08/29/19  6:04 AM   Specimen: BLOOD  Result Value Ref Range Status   Specimen Description   Final    BLOOD RIGHT ANTECUBITAL Performed at [facility] [address] [place], [place] [address]    Special Requests   Final    Blood Culture adequate volume BOTTLES DRAWN AEROBIC AND ANAEROBIC Performed at [place] [facility] [address] [place], [place] [address]    Culture   Final    NO GROWTH 2 DAYS Performed at [facility], [address], [place], [place] [address]    Report Status PENDING  Incomplete  Blood culture (routine x 2)     Status: None (Preliminary result)   Collection Time: 08/29/19  6:05 AM   Specimen: BLOOD  Result Value Ref Range Status   Specimen Description   Final    BLOOD LEFT ANTECUBITAL Performed at [place] [address] [place], [place] [address]    Special Requests   Final    Blood Culture adequate volume BOTTLES DRAWN AEROBIC AND ANAEROBIC Performed at [facility] [address] [place], [place] [address]    Culture   Final    NO GROWTH 2 DAYS Performed at [facility], [address] [place], [place] [address]    Report Status PENDING  Incomplete     Time coordinating discharge: 35 minutes  SIGNED:   Rodena Goldmann, DO [facility] 08/31/2019, 9:10 AM  If 7PM-7AM, please contact night-coverage [email or web address]

## 2019-09-03 LAB — CULTURE, BLOOD (ROUTINE X 2)
Culture: NO GROWTH
Culture: NO GROWTH
Special Requests: ADEQUATE
Special Requests: ADEQUATE

## 2019-10-07 ENCOUNTER — Encounter (HOSPITAL_COMMUNITY): Payer: Self-pay

## 2019-10-07 ENCOUNTER — Emergency Department (HOSPITAL_COMMUNITY)
Admission: EM | Admit: 2019-10-07 | Discharge: 2019-10-07 | Disposition: A | Discharge status: Home / Self Care | Payer: Medicaid Other | Attending: Emergency Medicine | Admitting: Emergency Medicine

## 2019-10-07 ENCOUNTER — Other Ambulatory Visit: Payer: Self-pay

## 2019-10-07 ENCOUNTER — Emergency Department (HOSPITAL_COMMUNITY): Payer: Medicaid Other

## 2019-10-07 DIAGNOSIS — Z21 Asymptomatic human immunodeficiency virus [HIV] infection status: Secondary | ICD-10-CM | POA: Insufficient documentation

## 2019-10-07 DIAGNOSIS — Z79899 Other long term (current) drug therapy: Secondary | ICD-10-CM | POA: Insufficient documentation

## 2019-10-07 DIAGNOSIS — R0602 Shortness of breath: Secondary | ICD-10-CM | POA: Diagnosis present

## 2019-10-07 DIAGNOSIS — J441 Chronic obstructive pulmonary disease with (acute) exacerbation: Secondary | ICD-10-CM | POA: Insufficient documentation

## 2019-10-07 DIAGNOSIS — Z85118 Personal history of other malignant neoplasm of bronchus and lung: Secondary | ICD-10-CM | POA: Diagnosis not present

## 2019-10-07 DIAGNOSIS — F17210 Nicotine dependence, cigarettes, uncomplicated: Secondary | ICD-10-CM | POA: Diagnosis not present

## 2019-10-07 DIAGNOSIS — Z20822 Contact with and (suspected) exposure to covid-19: Secondary | ICD-10-CM | POA: Insufficient documentation

## 2019-10-07 LAB — CBC WITH DIFFERENTIAL/PLATELET
Abs Immature Granulocytes: 0.02 10*3/uL (ref 0.00–0.07)
Basophils Absolute: 0.1 10*3/uL (ref 0.0–0.1)
Basophils Relative: 1 %
Eosinophils Absolute: 0.3 10*3/uL (ref 0.0–0.5)
Eosinophils Relative: 4 %
HCT: 42.3 % (ref 36.0–46.0)
Hemoglobin: 13.2 g/dL (ref 12.0–15.0)
Immature Granulocytes: 0 %
Lymphocytes Relative: 48 %
Lymphs Abs: 4.4 10*3/uL — ABNORMAL HIGH (ref 0.7–4.0)
MCH: 28.6 pg (ref 26.0–34.0)
MCHC: 31.2 g/dL (ref 30.0–36.0)
MCV: 91.8 fL (ref 80.0–100.0)
Monocytes Absolute: 0.6 10*3/uL (ref 0.1–1.0)
Monocytes Relative: 6 %
Neutro Abs: 3.8 10*3/uL (ref 1.7–7.7)
Neutrophils Relative %: 41 %
Platelets: 305 10*3/uL (ref 150–400)
RBC: 4.61 MIL/uL (ref 3.87–5.11)
RDW: 16.6 % — ABNORMAL HIGH (ref 11.5–15.5)
WBC: 9.3 10*3/uL (ref 4.0–10.5)
nRBC: 0 % (ref 0.0–0.2)

## 2019-10-07 LAB — BASIC METABOLIC PANEL
Anion gap: 8 (ref 5–15)
BUN: 11 mg/dL (ref 6–20)
CO2: 28 mmol/L (ref 22–32)
Calcium: 9.3 mg/dL (ref 8.9–10.3)
Chloride: 104 mmol/L (ref 98–111)
Creatinine, Ser: 0.7 mg/dL (ref 0.44–1.00)
GFR calc Af Amer: >60 mL/min (ref >60)
GFR calc non Af Amer: >60 mL/min (ref >60)
Glucose, Bld: 121 mg/dL — ABNORMAL HIGH (ref 70–99)
Potassium: 3.7 mmol/L (ref 3.5–5.1)
Sodium: 140 mmol/L (ref 135–145)

## 2019-10-07 LAB — RESPIRATORY PANEL BY RT PCR (FLU A&B, COVID)
Influenza A by PCR: NEGATIVE
Influenza B by PCR: NEGATIVE
SARS Coronavirus 2 by RT PCR: NEGATIVE

## 2019-10-07 MED ORDER — OXYCODONE-ACETAMINOPHEN 5-325 MG PO TABS
1.0000 | ORAL_TABLET | Freq: Once | ORAL | Status: AC
  Administered 2019-10-07: 10:00:00 1 via ORAL
  Filled 2019-10-07: qty 1

## 2019-10-07 MED ORDER — ALBUTEROL SULFATE HFA 108 (90 BASE) MCG/ACT IN AERS
8.0000 | INHALATION_SPRAY | Freq: Once | RESPIRATORY_TRACT | Status: AC
  Administered 2019-10-07: 05:00:00 8 via RESPIRATORY_TRACT
  Filled 2019-10-07: qty 6.7

## 2019-10-07 MED ORDER — GABAPENTIN 300 MG PO CAPS
300.0000 mg | ORAL_CAPSULE | Freq: Once | ORAL | Status: AC
  Administered 2019-10-07: 300 mg via ORAL
  Filled 2019-10-07: qty 1

## 2019-10-07 MED ORDER — AZITHROMYCIN 250 MG PO TABS: ORAL_TABLET | ORAL | 0 refills | Status: DC

## 2019-10-07 MED ORDER — IPRATROPIUM-ALBUTEROL 0.5-2.5 (3) MG/3ML IN SOLN
3.0000 mL | Freq: Once | RESPIRATORY_TRACT | Status: AC
  Administered 2019-10-07: 09:00:00 3 mL via RESPIRATORY_TRACT
  Filled 2019-10-07: qty 3

## 2019-10-07 MED ORDER — IPRATROPIUM-ALBUTEROL 0.5-2.5 (3) MG/3ML IN SOLN
3.0000 mL | Freq: Once | RESPIRATORY_TRACT | Status: AC
  Administered 2019-10-07: 06:00:00 3 mL via RESPIRATORY_TRACT
  Filled 2019-10-07: qty 3

## 2019-10-07 MED ORDER — PREDNISONE 20 MG PO TABS: 40.0000 mg | ORAL_TABLET | Freq: Every day | ORAL | 0 refills | Status: DC

## 2019-10-07 MED ORDER — METHYLPREDNISOLONE SODIUM SUCC 125 MG IJ SOLR
125.0000 mg | Freq: Once | INTRAMUSCULAR | Status: AC
  Administered 2019-10-07: 05:00:00 125 mg via INTRAVENOUS
  Filled 2019-10-07: qty 2

## 2019-10-07 MED ORDER — PREDNISONE 20 MG PO TABS: 40.0000 mg | ORAL_TABLET | Freq: Every day | ORAL | 0 refills | Status: AC

## 2019-10-07 NOTE — ED Triage Notes (Signed)
Pt arrives from home via [facility] c/o increasing SOB. Prior to arrival [facility] administered 1 duoneb treatment and Pt reports minimum relief. Pt chronically on 2L Nasal Cannula at home.

## 2019-10-07 NOTE — ED Provider Notes (Addendum)
[facility] EMERGENCY DEPARTMENT Provider Note   CSN: [id number] Arrival date & time: 10/07/19  5638     History Chief Complaint  Patient presents with  . Shortness of Breath    Elizabeth [name] is a 49 y.o. female.  The history is provided by the patient.  Shortness of Breath She has history COPD, lung cancer, HIV infection, seizure disorder, substance abuse and comes in because of difficulty breathing which started yesterday.  Got much worse when she woke up in try to walk to the bathroom.  She has had a nonproductive cough.  She denies fever, chills, sweats.  She denies chest pain, heaviness, tightness, pressure.  She did not use her home nebulizer, but called for an ambulance.  She came in by ambulance and did receive albuterol and ipratropium via nebulizer with modest improvement.  She does continue to smoke.  She is on chronic home oxygen at 2 L/min.  She denies exposure to COVID-19.  She denies alteration in sense of smell or taste.  She has not received COVID-19 vaccine because her primary care provider advised her not to get vaccinated because of HIV status.     Past Medical History:  Diagnosis Date  . Asthma    uses Albuterol inhaler daily as needed  . COPD (chronic obstructive pulmonary disease) ([place])   . Depression    takes Trazodone and Seroquel nightly  . Dizziness   . Dysphagia   . Headache(784.0)   . History of migraine    last one about 2wks ago  . HIV (human immunodeficiency virus infection) ([place])    takes Stribild daily  . Lung cancer ([place])   . Nocturia   . Seizures ([place])    takes Depakote daily-last one about 6 months ago as of 01/02/19  . Shortness of breath    sitting/lying/exertion  . Substance abuse ([place])   . Urinary frequency     Patient Active Problem List   Diagnosis Date Noted  . Hypokalemia 08/30/2019  . Diarrhea 08/30/2019  . Polysubstance overdose 08/15/2019  . Overdose opiate ([place]) 08/15/2019  . Pain in joint, lower leg 10/21/2018  .  Long-term use of high-risk medication 06/29/2018  . Lower extremity neuropathy 06/29/2018  . 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 Cyst of right ovary 01/29/2018  . Unspecified convulsions ([place]) 01/29/2018  . Leukocytosis 05/10/2017  . Lung cancer ([place]) 03/07/2017  . Polysubstance abuse ([place]) 02/22/2017  . Cocaine abuse ([place]) 05/20/2016  . Generalized anxiety disorder 05/20/2016  . COPD exacerbation ([place]) 10/20/2013  . COPD (chronic obstructive pulmonary disease) ([place]) 10/20/2013  . 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 Hemoptysis 05/08/2013  . Dyspnea 04/30/2013  . Abnormal uterine bleeding (AUB) 04/13/2013  . Pulmonary mass 04/13/2013  . CARBUNCLE AND FURUNCLE OF TRUNK 01/12/2010  . Depression 11/29/2009  . BREAST MASS, LEFT 11/29/2009  . BACK PAIN, LUMBAR 11/29/2009  . Human immunodeficiency virus (HIV) disease ([place]) 10/26/2009  . SMOKER 10/26/2009  . COPD GOLD III 10/26/2009  . COCAINE ABUSE, HX OF 10/26/2009    Past Surgical History:  Procedure Laterality Date  . BREAST SURGERY Left    mass removed from left breast-fatty tissue  . CESAREAN SECTION  45yr ago  . VIDEO ASSISTED THORACOSCOPY (VATS)/WEDGE RESECTION Right 07/05/2013   Procedure: VIDEO ASSISTED THORACOSCOPY (VATS)/WEDGE RESECTION, Right Upper Lobectomy with lymph node disecction and OnQ placement;  Surgeon: SMelrose Nakayama MD;  Location: [facility]  Service: Thoracic;  Laterality: Right;  (R)VATS, WEDGE RESECTION, POSSIBLE LOBECTOMY, POSSIBLE  CHEST WALL RESECTION  . VIDEO BRONCHOSCOPY Bilateral 05/05/2013   Procedure: VIDEO BRONCHOSCOPY WITH FLUORO;  Surgeon: Tanda Rockers, MD;  Location: [facility];  Service: Cardiopulmonary;  Laterality: Bilateral;     OB History   No obstetric history on file.     Family History  Problem Relation Age of Onset  . Diabetes Mother   . CAD Mother   . Hypertension Mother   . Hypertension Sister   . Lung cancer Maternal Aunt        smoked  . Allergies Sister   .  Asthma Sister   . Lung cancer Maternal Uncle        never smoker  . Lung cancer Maternal Grandmother        never smoker    Social History   Tobacco Use  . Smoking status: Current Every Day Smoker    Packs/day: 4.00    Years: 24.00    Pack years: 96.00    Types: Cigarettes    Last attempt to quit: 11/01/2013    Years since quitting: 5.9  . Smokeless tobacco: Never Used  Substance Use Topics  . Alcohol use: No    Alcohol/week: 0.0 standard drinks  . Drug use: No    Types: "Crack" cocaine    Comment: drug relapse 05/2014, clean x 3 months 09/08/14- 03/28/18 pt reports in recovery x 2 years at this time.     Home Medications Prior to Admission medications   Medication Sig Start Date End Date Taking? Authorizing Provider  albuterol (PROVENTIL HFA;VENTOLIN HFA) 108 (90 BASE) MCG/ACT inhaler Inhale 2 puffs into the lungs every 6 (six) hours as needed for wheezing or shortness of breath.    [provider]  albuterol (PROVENTIL) (2.5 MG/3ML) 0.083% nebulizer solution Take 6 mLs (5 mg total) by nebulization every 6 (six) hours as needed for wheezing or shortness of breath. 10/28/13   Kirichenko, [place]ahoma Rocker, PA-C  divalproex (DEPAKOTE) 250 MG DR tablet Take 250-500 mg by mouth 2 (two) times daily. 250 mg in the am and 500 in the pm    [provider]  gabapentin (NEURONTIN) 300 MG capsule Take 300 mg by mouth 3 (three) times daily.  09/14/18   [provider]  GENVOYA 150-150-200-10 MG TABS tablet Take 1 tablet by mouth every morning.  03/19/18   [provider]  hydrOXYzine (ATARAX/VISTARIL) 25 MG tablet Take 10 mg by mouth 3 (three) times daily as needed.  05/20/18   [provider]  ipratropium-albuterol (DUONEB) 0.5-2.5 (3) MG/3ML SOLN Inhale 3 mLs into the lungs every 6 (six) hours as needed (for shortness of breath-wheezing).  03/19/18   [provider]  levETIRAcetam (KEPPRA) 750 MG tablet Take 750 mg by mouth 2 (two) times daily.   03/19/18   [provider]  mometasone-formoterol (DULERA) 100-5 MCG/ACT AERO Inhale 2 puffs into the lungs 2 (two) times daily.    [provider]  ondansetron (ZOFRAN) 4 MG tablet Take 1 tablet (4 mg total) by mouth every 8 (eight) hours as needed for nausea or vomiting. 08/31/19   Manuella Ghazi, Pratik D, DO  oxyCODONE ER (XTAMPZA ER) 13.5 MG C12A Take 13.5 mg by mouth daily as needed (pain).    [provider]  oxyCODONE-acetaminophen (PERCOCET/ROXICET) 5-325 MG tablet Take 1 tablet by mouth 4 (four) times daily as needed for moderate pain.    [provider]  pantoprazole (PROTONIX) 40 MG tablet Take 40 mg by mouth 2 (two) times daily. 11/08/18  [provider]  pregabalin (LYRICA) 75 MG capsule Take 1 capsule by mouth 3 (three) times daily. 10/01/18   [provider]  rOPINIRole (REQUIP) 1 MG tablet Take 1 mg by mouth at bedtime.  03/19/18   [provider]  SPIRIVA HANDIHALER 18 MCG inhalation capsule Place 18 mcg into inhaler and inhale daily.  03/19/18   [provider]  sucralfate (CARAFATE) 1 g tablet Take 1 g by mouth 4 (four) times daily.     [provider]  SYMBICORT 160-4.5 MCG/ACT inhaler Inhale 2 puffs into the lungs 2 (two) times daily.  03/19/18   [provider]  valACYclovir (VALTREX) 500 MG tablet Take 500 mg by mouth 2 (two) times daily.    [provider]  venlafaxine (EFFEXOR) 37.5 MG tablet Take 1 tablet by mouth 2 (two) times a day. 10/21/18   [provider]  Vitamin D, Ergocalciferol, (DRISDOL) 1.25 MG (50000 UT) CAPS capsule Take 1 capsule by mouth once a week. 06/18/18   [provider]    Allergies    Penicillins, Ibuprofen, Morphine, Cariprazine, and Tramadol  Review of Systems   Review of Systems  Respiratory: Positive for shortness of breath.   All other systems reviewed and are negative.   Physical Exam Updated Vital Signs BP (!) 145/91 (BP Location:  Right Arm)   Pulse 93   Temp 98.2 F (36.8 C) (Oral)   Resp (!) 24   Ht 5' (1.524 m)   Wt 63.2 kg   SpO2 95%   BMI 27.21 kg/m   Physical Exam Vitals and nursing note reviewed.   49 year old female, appears mildly dyspneic, but is in no acute distress. Vital signs are significant for elevated respiratory rate and blood pressure. Oxygen saturation is 95%, which is normal.  She is able to speak in complete sentences without taking a new breath. Head is normocephalic and atraumatic. PERRLA, EOMI. Oropharynx is clear. Neck is nontender and supple without adenopathy or JVD. Back is nontender and there is no CVA tenderness. Lungs have diminished airflow diffusely and mild expiratory wheezing diffusely.  There are no rales or rhonchi. Chest is nontender. Heart has regular rate and rhythm without murmur. Abdomen is soft, flat, nontender without masses or hepatosplenomegaly and peristalsis is normoactive. Extremities have no cyanosis or edema, full range of motion is present. Skin is warm and dry without rash. Neurologic: Mental status is normal, cranial nerves are intact, there are no motor or sensory deficits.  ED Results / Procedures / Treatments   Labs (all labs ordered are listed, but only abnormal results are displayed) Labs Reviewed - No data to display  Radiology No results found.  Procedures Procedures   Medications Ordered in ED Medications - No data to display  ED Course  I have reviewed the triage vital signs and the nursing notes.  Pertinent labs & imaging results that were available during my care of the patient were reviewed by me and considered in my medical decision making (see chart for details).  MDM Rules/Calculators/A&P COPD exacerbation.  Low risk of COVID-19.  Old records are reviewed showing she does have prior ED visits and hospitalizations for COPD.  Will check chest x-ray and give additional albuterol as well as intravenous methylprednisolone.  Chest  x-ray shows no acute process.  CBC and metabolic panel are unremarkable.  Covid test has come back negative and she is given albuterol and ipratropium via nebulizer.  Following this, she feels slightly better but  is still noted to be tachypneic at rest.  Will give additional albuterol and ipratropium.  Case is signed out to Dr. Sedonia Small.  Elizabeth [name] was evaluated in Emergency Department on 10/07/2019 for the symptoms described in the history of present illness. She was evaluated in the context of the global COVID-19 pandemic, which necessitated consideration that the patient might be at risk for infection with the SARS-CoV-2 virus that causes COVID-19. Institutional protocols and algorithms that pertain to the evaluation of patients at risk for COVID-19 are in a state of rapid change based on information released by regulatory bodies including the CDC and federal and state organizations. These policies and algorithms were followed during the patient's care in the ED.  Final Clinical Impression(s) / ED Diagnoses Final diagnoses:  COPD exacerbation [facility])    Rx / DC Orders ED Discharge Orders    None       Delora Fuel, MD 79/39/03 0092    Delora Fuel, MD 33/00/76 [phone]

## 2019-10-07 NOTE — ED Provider Notes (Signed)
  Provider Note MRN:  [id number]  Arrival date & time: 10/07/19    ED Course and Medical Decision Making  Assumed care from Dr. Roxanne Mins at shift change.  COPD exacerbation, awaiting one more neb treatment and reassessment.  May need admission.  Upon reassessment patient is sitting comfortably in bed with no respiratory distress.  She states she is feeling much better and would like to go home.  She ambulated to the bathroom on her own without any symptoms or issues.  She is appropriate for outpatient management.  Procedures  Final Clinical Impressions(s) / ED Diagnoses     ICD-10-CM   1. COPD exacerbation ([place])  J44.1     ED Discharge Orders         Ordered    azithromycin (ZITHROMAX) 250 MG tablet     10/07/19 1000    predniSONE (DELTASONE) 20 MG tablet  Daily     10/07/19 1000            Discharge Instructions     You were evaluated in the Emergency Department and after careful evaluation, we did not find any emergent condition requiring admission or further testing in the hospital.  Your exam/testing today was overall reassuring.  Your symptoms seem to be due to a flare of your COPD.  Please take the azithromycin antibiotic and the prednisone steroid medication as directed.  Please return to the Emergency Department if you experience any worsening of your condition.  We encourage you to follow up with a primary care provider.  Thank you for allowing [place] to be a part of your care.      Barth Kirks. Sedonia Small, [place] [email or web address]    Maudie Flakes, MD 10/07/19 1002

## 2019-10-07 NOTE — Discharge Instructions (Addendum)
You were evaluated in the Emergency Department and after careful evaluation, we did not find any emergent condition requiring admission or further testing in the hospital.  Your exam/testing today was overall reassuring.  Your symptoms seem to be due to a flare of your COPD.  Please take the azithromycin antibiotic and the prednisone steroid medication as directed.  Please return to the Emergency Department if you experience any worsening of your condition.  We encourage you to follow up with a primary care provider.  Thank you for allowing [place] to be a part of your care.

## 2019-10-22 ENCOUNTER — Emergency Department (HOSPITAL_COMMUNITY)
Admission: EM | Admit: 2019-10-22 | Discharge: 2019-10-22 | Disposition: A | Discharge status: Home / Self Care | Payer: Medicaid Other | Attending: Emergency Medicine | Admitting: Emergency Medicine

## 2019-10-22 ENCOUNTER — Other Ambulatory Visit: Payer: Self-pay

## 2019-10-22 ENCOUNTER — Emergency Department (HOSPITAL_COMMUNITY): Payer: Medicaid Other

## 2019-10-22 DIAGNOSIS — Z79899 Other long term (current) drug therapy: Secondary | ICD-10-CM | POA: Diagnosis not present

## 2019-10-22 DIAGNOSIS — Z20822 Contact with and (suspected) exposure to covid-19: Secondary | ICD-10-CM | POA: Diagnosis not present

## 2019-10-22 DIAGNOSIS — F17210 Nicotine dependence, cigarettes, uncomplicated: Secondary | ICD-10-CM | POA: Diagnosis not present

## 2019-10-22 DIAGNOSIS — R05 Cough: Secondary | ICD-10-CM | POA: Diagnosis present

## 2019-10-22 DIAGNOSIS — J441 Chronic obstructive pulmonary disease with (acute) exacerbation: Secondary | ICD-10-CM | POA: Diagnosis not present

## 2019-10-22 LAB — COMPREHENSIVE METABOLIC PANEL
ALT: 14 U/L (ref 0–44)
AST: 17 U/L (ref 15–41)
Albumin: 3.6 g/dL (ref 3.5–5.0)
Alkaline Phosphatase: 78 U/L (ref 38–126)
Anion gap: 11 (ref 5–15)
BUN: 10 mg/dL (ref 6–20)
CO2: 28 mmol/L (ref 22–32)
Calcium: 9.2 mg/dL (ref 8.9–10.3)
Chloride: 99 mmol/L (ref 98–111)
Creatinine, Ser: 0.74 mg/dL (ref 0.44–1.00)
GFR calc Af Amer: >60 mL/min (ref >60)
GFR calc non Af Amer: >60 mL/min (ref >60)
Glucose, Bld: 106 mg/dL — ABNORMAL HIGH (ref 70–99)
Potassium: 4.2 mmol/L (ref 3.5–5.1)
Sodium: 138 mmol/L (ref 135–145)
Total Bilirubin: 0.8 mg/dL (ref 0.3–1.2)
Total Protein: 6.3 g/dL — ABNORMAL LOW (ref 6.5–8.1)

## 2019-10-22 LAB — POCT I-STAT EG7
Acid-Base Excess: 4 mmol/L — ABNORMAL HIGH (ref 0.0–2.0)
Bicarbonate: 29.6 mmol/L — ABNORMAL HIGH (ref 20.0–28.0)
Calcium, Ion: 1.17 mmol/L (ref 1.15–1.40)
HCT: 40 % (ref 36.0–46.0)
Hemoglobin: 13.6 g/dL (ref 12.0–15.0)
O2 Saturation: 75 %
Potassium: 4.1 mmol/L (ref 3.5–5.1)
Sodium: 137 mmol/L (ref 135–145)
TCO2: 31 mmol/L (ref 22–32)
pCO2, Ven: 45.4 mmHg (ref 44.0–60.0)
pH, Ven: 7.422 (ref 7.250–7.430)
pO2, Ven: 40 mmHg (ref 32.0–45.0)

## 2019-10-22 LAB — CBC WITH DIFFERENTIAL/PLATELET
Abs Immature Granulocytes: 0.04 10*3/uL (ref 0.00–0.07)
Basophils Absolute: 0.1 10*3/uL (ref 0.0–0.1)
Basophils Relative: 1 %
Eosinophils Absolute: 0.3 10*3/uL (ref 0.0–0.5)
Eosinophils Relative: 3 %
HCT: 40.6 % (ref 36.0–46.0)
Hemoglobin: 12.9 g/dL (ref 12.0–15.0)
Immature Granulocytes: 0 %
Lymphocytes Relative: 44 %
Lymphs Abs: 4.4 10*3/uL — ABNORMAL HIGH (ref 0.7–4.0)
MCH: 29.7 pg (ref 26.0–34.0)
MCHC: 31.8 g/dL (ref 30.0–36.0)
MCV: 93.3 fL (ref 80.0–100.0)
Monocytes Absolute: 0.9 10*3/uL (ref 0.1–1.0)
Monocytes Relative: 9 %
Neutro Abs: 4.2 10*3/uL (ref 1.7–7.7)
Neutrophils Relative %: 43 %
Platelets: 312 10*3/uL (ref 150–400)
RBC: 4.35 MIL/uL (ref 3.87–5.11)
RDW: 15.5 % (ref 11.5–15.5)
WBC: 9.8 10*3/uL (ref 4.0–10.5)
nRBC: 0 % (ref 0.0–0.2)

## 2019-10-22 LAB — SARS CORONAVIRUS 2 BY RT PCR (HOSPITAL ORDER, PERFORMED IN ~~LOC~~ HOSPITAL LAB): SARS Coronavirus 2: NEGATIVE

## 2019-10-22 MED ORDER — OXYCODONE-ACETAMINOPHEN 5-325 MG PO TABS
1.0000 | ORAL_TABLET | Freq: Once | ORAL | Status: AC
  Administered 2019-10-22: 1 via ORAL
  Filled 2019-10-22: qty 1

## 2019-10-22 MED ORDER — ALBUTEROL SULFATE HFA 108 (90 BASE) MCG/ACT IN AERS
8.0000 | INHALATION_SPRAY | Freq: Once | RESPIRATORY_TRACT | Status: AC
  Administered 2019-10-22: 8 via RESPIRATORY_TRACT
  Filled 2019-10-22: qty 6.7

## 2019-10-22 MED ORDER — MAGNESIUM SULFATE 2 GM/50ML IV SOLN
2.0000 g | Freq: Once | INTRAVENOUS | Status: AC
  Administered 2019-10-22: 2 g via INTRAVENOUS
  Filled 2019-10-22: qty 50

## 2019-10-22 MED ORDER — PREDNISONE 20 MG PO TABS
20.0000 mg | ORAL_TABLET | Freq: Two times a day (BID) | ORAL | 0 refills | Status: DC
Start: 2019-10-22 — End: 2023-01-11

## 2019-10-22 MED ORDER — METHYLPREDNISOLONE SODIUM SUCC 125 MG IJ SOLR
125.0000 mg | Freq: Once | INTRAMUSCULAR | Status: AC
  Administered 2019-10-22: 125 mg via INTRAVENOUS
  Filled 2019-10-22: qty 2

## 2019-10-22 NOTE — ED Triage Notes (Signed)
Pt BIB [facility] from home c/o worsening cough x3 days. Cough is chronic but now productive and bloody. Baseline 2 L O2

## 2019-10-22 NOTE — Discharge Instructions (Addendum)
Continue usual at home medications.  Start the prednisone prescription tomorrow morning.  See your doctor for checkup next week.

## 2019-10-22 NOTE — ED Notes (Signed)
Discharge instructions discussed with pt. Pt verbalized understanding with no questions at this time. Pt. To go home with family

## 2019-10-22 NOTE — ED Provider Notes (Signed)
[facility] DEPART[facility] Provider Note   CSN: [id number] Arrival date & time: 10/22/19  1914     History Chief Complaint  Patient presents with  . Cough    Elizabeth [name] is a 49 y.o. female.  HPI She presents for evaluation of cough which has been ongoing for several days, despite using her usual medications including oxygen and nebulizers at home.  She is coughing producing clear sputum.  She denies fever, chills, nausea or vomiting.  She tumbled down a few steps 5 days ago but does not really have any injuries from that.  She is a somewhat poor historian and cannot recall her medications exactly.  She has not had Covid vaccines or Covid infection, in the past.  She denies other concurrent illnesses or problems.  There are no other known modifying factors.    Past Medical History:  Diagnosis Date  . Asthma    uses Albuterol inhaler daily as needed  . COPD (chronic obstructive pulmonary disease) ([place])   . Depression    takes Trazodone and Seroquel nightly  . Dizziness   . Dysphagia   . Headache(784.0)   . History of migraine    last one about 2wks ago  . HIV (human immunodeficiency virus infection) ([place])    takes Stribild daily  . Lung cancer ([place])   . Nocturia   . Seizures ([place])    takes Depakote daily-last one about 6 months ago as of 01/02/19  . Shortness of breath    sitting/lying/exertion  . Substance abuse ([place])   . Urinary frequency     Patient Active Problem List   Diagnosis Date Noted  . Hypokalemia 08/30/2019  . Diarrhea 08/30/2019  . Polysubstance overdose 08/15/2019  . Overdose opiate ([place]) 08/15/2019  . Pain in joint, lower leg 10/21/2018  . Long-term use of high-risk medication 06/29/2018  . Lower extremity neuropathy 06/29/2018  . 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 Cyst of right ovary 01/29/2018  . Unspecified convulsions ([place]) 01/29/2018  . Leukocytosis 05/10/2017  . Lung cancer ([place]) 03/07/2017  . Polysubstance abuse  ([place]) 02/22/2017  . Cocaine abuse ([place]) 05/20/2016  . Generalized anxiety disorder 05/20/2016  . COPD exacerbation ([place]) 10/20/2013  . COPD (chronic obstructive pulmonary disease) ([place]) 10/20/2013  . 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 Hemoptysis 05/08/2013  . Dyspnea 04/30/2013  . Abnormal uterine bleeding (AUB) 04/13/2013  . Pulmonary mass 04/13/2013  . CARBUNCLE AND FURUNCLE OF TRUNK 01/12/2010  . Depression 11/29/2009  . BREAST MASS, LEFT 11/29/2009  . BACK PAIN, LUMBAR 11/29/2009  . Human immunodeficiency virus (HIV) disease ([place]) 10/26/2009  . SMOKER 10/26/2009  . COPD GOLD III 10/26/2009  . COCAINE ABUSE, HX OF 10/26/2009    Past Surgical History:  Procedure Laterality Date  . BREAST SURGERY Left    mass removed from left breast-fatty tissue  . CESAREAN SECTION  64yr ago  . VIDEO ASSISTED THORACOSCOPY (VATS)/WEDGE RESECTION Right 07/05/2013   Procedure: VIDEO ASSISTED THORACOSCOPY (VATS)/WEDGE RESECTION, Right Upper Lobectomy with lymph node disecction and OnQ placement;  Surgeon: SMelrose Nakayama MD;  Location: [facility]  Service: Thoracic;  Laterality: Right;  (R)VATS, WEDGE RESECTION, POSSIBLE LOBECTOMY, POSSIBLE CHEST WALL RESECTION  . VIDEO BRONCHOSCOPY Bilateral 05/05/2013   Procedure: VIDEO BRONCHOSCOPY WITH FLUORO;  Surgeon: MTanda Rockers MD;  Location: [facility];  Service: Cardiopulmonary;  Laterality: Bilateral;     OB History   No obstetric history on file.     Family History  Problem  Relation Age of Onset  . Diabetes Mother   . CAD Mother   . Hypertension Mother   . Hypertension Sister   . Lung cancer Maternal Aunt        smoked  . Allergies Sister   . Asthma Sister   . Lung cancer Maternal Uncle        never smoker  . Lung cancer Maternal Grandmother        never smoker    Social History   Tobacco Use  . Smoking status: Current Every Day Smoker    Packs/day: 4.00    Years: 24.00    Pack years: 96.00    Types: Cigarettes     Last attempt to quit: 11/01/2013    Years since quitting: 5.9  . Smokeless tobacco: Never Used  Substance Use Topics  . Alcohol use: No    Alcohol/week: 0.0 standard drinks  . Drug use: No    Types: "Crack" cocaine    Comment: drug relapse 05/2014, clean x 3 months 09/08/14- 03/28/18 pt reports in recovery x 2 years at this time.     Home Medications Prior to Admission medications   Medication Sig Start Date End Date Taking? Authorizing Provider  albuterol (PROVENTIL HFA;VENTOLIN HFA) 108 (90 BASE) MCG/ACT inhaler Inhale 2 puffs into the lungs every 6 (six) hours as needed for wheezing or shortness of breath.   Yes [provider]  albuterol (PROVENTIL) (2.5 MG/3ML) 0.083% nebulizer solution Take 6 mLs (5 mg total) by nebulization every 6 (six) hours as needed for wheezing or shortness of breath. 10/28/13  Yes Kirichenko, Tatyana, PA-C  atorvastatin (LIPITOR) 20 MG tablet Take 20 mg by mouth daily.   Yes [provider]  gabapentin (NEURONTIN) 300 MG capsule Take 300 mg by mouth 3 (three) times daily.  09/14/18  Yes [provider]  GENVOYA 150-150-200-10 MG TABS tablet Take 1 tablet by mouth every morning.  03/19/18  Yes [provider]  ipratropium-albuterol (DUONEB) 0.5-2.5 (3) MG/3ML SOLN Inhale 3 mLs into the lungs every 6 (six) hours as needed (for shortness of breath-wheezing).  03/19/18  Yes [provider]  levETIRAcetam (KEPPRA) 750 MG tablet Take 750 mg by mouth 2 (two) times daily.  03/19/18  Yes [provider]  mometasone-formoterol (DULERA) 100-5 MCG/ACT AERO Inhale 2 puffs into the lungs 2 (two) times daily.   Yes [provider]  ondansetron (ZOFRAN) 4 MG tablet Take 1 tablet (4 mg total) by mouth every 8 (eight) hours as needed for nausea or vomiting. 08/31/19  Yes Manuella Ghazi, Pratik D, DO  oxyCODONE ER (XTAMPZA ER) 13.5 MG C12A Take 13.5 mg by mouth daily as needed (pain).   Yes [provider]   oxyCODONE-acetaminophen (PERCOCET/ROXICET) 5-325 MG tablet Take 1 tablet by mouth 4 (four) times daily as needed for moderate pain.   Yes [provider]  SPIRIVA HANDIHALER 18 MCG inhalation capsule Place 18 mcg into inhaler and inhale daily.  03/19/18  Yes [provider]  SYMBICORT 160-4.5 MCG/ACT inhaler Inhale 2 puffs into the lungs 2 (two) times daily.  03/19/18  Yes [provider]  valACYclovir (VALTREX) 500 MG tablet Take 500 mg by mouth 2 (two) times daily.   Yes [provider]  Vitamin D, Ergocalciferol, (DRISDOL) 1.25 MG (50000 UT) CAPS capsule Take 1 capsule by mouth once a week. 06/18/18  Yes [provider]  azithromycin (ZITHROMAX) 250 MG tablet Take 2 tablets together on the first day, then 1 every day  until finished. Patient not taking: Reported on 10/22/2019 10/07/19   Maudie Flakes, MD  predniSONE (DELTASONE) 20 MG tablet Take 1 tablet (20 mg total) by mouth 2 (two) times daily. 10/22/19   Daleen Bo, MD    Allergies    Penicillins, Ibuprofen, Morphine, Cariprazine, and Tramadol  Review of Systems   Review of Systems  All other systems reviewed and are negative.   Physical Exam Updated Vital Signs BP 136/70   Pulse 92   Temp 98.1 F (36.7 C) (Oral)   Resp 19   Ht 5' (1.524 m)   Wt 63.2 kg   SpO2 (!) 89%   BMI 27.21 kg/m   Physical Exam Vitals and nursing note reviewed.  Constitutional:      General: She is not in acute distress.    Appearance: She is well-developed. She is not ill-appearing, toxic-appearing or diaphoretic.  HENT:     Head: Normocephalic and atraumatic.     Nose: No congestion.     Mouth/Throat:     Mouth: Mucous membranes are moist.  Eyes:     Conjunctiva/sclera: Conjunctivae normal.     Pupils: Pupils are equal, round, and reactive to light.  Neck:     Trachea: Phonation normal.  Cardiovascular:     Rate and Rhythm: Normal rate and regular rhythm.  Pulmonary:     Effort: Pulmonary  effort is normal. No respiratory distress.     Breath sounds: No stridor. No wheezing or rhonchi.     Comments: Poor air movement bilaterally Chest:     Chest wall: No tenderness.  Abdominal:     General: There is no distension.     Palpations: Abdomen is soft.     Tenderness: There is no abdominal tenderness. There is no guarding.  Musculoskeletal:        General: Normal range of motion.     Cervical back: Normal range of motion and neck supple.     Right lower leg: No edema.     Left lower leg: No edema.  Skin:    General: Skin is warm and dry.  Neurological:     Mental Status: She is alert and oriented to person, place, and time.     Motor: No abnormal muscle tone.  Psychiatric:        Mood and Affect: Mood normal.        Behavior: Behavior normal.        Thought Content: Thought content normal.        Judgment: Judgment normal.     ED Results / Procedures / Treatments   Labs (all labs ordered are listed, but only abnormal results are displayed) Labs Reviewed  COMPREHENSIVE METABOLIC PANEL - Abnormal; Notable for the following components:      Result Value   Glucose, Bld 106 (*)    Total Protein 6.3 (*)    All other components within normal limits  CBC WITH DIFFERENTIAL/PLATELET - Abnormal; Notable for the following components:   Lymphs Abs 4.4 (*)    All other components within normal limits  POCT I-STAT EG7 - Abnormal; Notable for the following components:   Bicarbonate 29.6 (*)    Acid-Base Excess 4.0 (*)    All other components within normal limits  SARS CORONAVIRUS 2 BY RT PCR (HOSPITAL ORDER, [facility])  RAPID URINE DRUG SCREEN, HOSP PERFORMED  URINALYSIS, ROUTINE W REFLEX MICROSCOPIC    EKG None   Date: 5/21521  Rate: 7:20 PM  Rhythm: normal sinus rhythm  QRS Axis: normal  PR and QT Intervals: normal  ST/T Wave abnormalities: normal  PR and QRS Conduction Disutrbances:none   Radiology DG Chest Port 1 View  Result  Date: 10/22/2019 CLINICAL DATA:  Shortness of breath EXAM: PORTABLE CHEST 1 VIEW COMPARISON:  10/07/2019 FINDINGS: Cardiac shadow is stable. Aortic calcifications are seen. Left lung is clear. Changes of prior surgery and architectural distortion are again noted in the right mid lung stable from the prior exam. No acute abnormality noted. IMPRESSION: Chronic changes similar to that seen on the prior exam. Electronically Signed   By: Inez Catalina M.D.   On: 10/22/2019 20:31    Procedures .Critical Care Performed by: Daleen Bo, MD Authorized by: Daleen Bo, MD   Critical care provider statement:    Critical care time (minutes):  35   Critical care start time:  10/22/2019 8:00 PM   Critical care end time:  10/22/2019 10:37 PM   Critical care time was exclusive of:  Separately billable procedures and treating other patients   Critical care was necessary to treat or prevent imminent or life-threatening deterioration of the following conditions:  Respiratory failure   Critical care was time spent personally by me on the following activities:  Blood draw for specimens, development of treatment plan with patient or surrogate, discussions with consultants, evaluation of patient's response to treatment, examination of patient, obtaining history from patient or surrogate, ordering and performing treatments and interventions, ordering and review of laboratory studies, pulse oximetry, re-evaluation of patient's condition, review of old charts and ordering and review of radiographic studies   (including critical care time)  Medications Ordered in ED Medications  oxyCODONE-acetaminophen (PERCOCET/ROXICET) 5-325 MG per tablet 1 tablet (has no administration in time range)  methylPREDNISolone sodium succinate (SOLU-MEDROL) 125 mg/2 mL injection 125 mg (125 mg Intravenous Given 10/22/19 2052)  magnesium sulfate IVPB 2 g 50 mL (2 g Intravenous New Bag/Given 10/22/19 2052)  albuterol (VENTOLIN HFA) 108 (90  Base) MCG/ACT inhaler 8 puff (8 puffs Inhalation Given 10/22/19 2037)    ED Course  I have reviewed the triage vital signs and the nursing notes.  Pertinent labs & imaging results that were available during my care of the patient were reviewed by me and considered in my medical decision making (see chart for details).  Clinical Course as of Oct 22 2235  Fri Oct 22, 2019  2147 Normal  SARS Coronavirus 2 by RT PCR (hospital order, performed in [facility] hospital lab) Nasopharyngeal Nasopharyngeal Swab [EW]  2225 Normal except bicarb slightly elevated  POCT I-Stat EG7(!) [EW]  2225 Normal  CBC with Differential(!) [EW]  2225 Normal except leukocyte [place], total protein low  Comprehensive metabolic panel(!) [EW]  8144 No infiltrate or CHF, interpreted by me  DG Chest [facility] 1 View [EW]    Clinical Course User Index [EW] Daleen Bo, MD   MDM Rules/Calculators/A&P                       Patient Vitals for the past 24 hrs:  BP Temp Temp src Pulse Resp SpO2 Height Weight  10/22/19 2200 136/70 -- -- 92 19 (!) 89 % -- --  10/22/19 2115 (!) 159/98 -- -- (!) 101 18 94 % -- --  10/22/19 2046 -- -- -- -- -- 100 % -- --  10/22/19 2015 (!) 142/98 -- -- 79 15 98 % -- --  10/22/19 2000 (!) 144/95 -- -- 82 14  97 % -- --  10/22/19 1945 (!) 135/96 -- -- 85 18 99 % -- --  10/22/19 1930 (!) 150/104 -- -- 95 (!) 22 95 % -- --  10/22/19 1924 -- 98.1 F (36.7 C) Oral -- -- -- 5' (1.524 m) 63.2 kg  10/22/19 1920 (!) 146/98 -- -- 95 16 98 % -- --    10:27 PM Reevaluation with update and discussion. After initial assessment and treatment, an updated evaluation reveals she states she feels much better and is ready to go home.  Findings discussed and questions answered. Daleen Bo   Medical Decision Making:  This patient is presenting for evaluation of shortness of breath and cough, which does require a range of treatment options, and is a complaint that involves a high risk of morbidity and  mortality. The differential diagnoses include acute infection, COPD exacerbation, metabolic abnormality. I decided to review old records, and in summary chronically ill patient on home oxygen with history of severe COPD..  I did not require additional historical information from anyone.  Clinical Laboratory Tests Ordered, included CBC, Metabolic panel, Urinalysis and Venous blood gas, Covid testing. Review indicates normal venous blood gas, normal CBC, normal chemistry panel except mild elevation of glucose and low protein.  Covid test negative. Radiologic Tests Ordered, included chest x-ray.  I independently Visualized: Radiographic images, which show no infiltrate or CHF  Cardiac Monitor Tracing which shows normal sinus rhythm     Critical Interventions-clinical evaluation, laboratory testing, medication administration for COPD exacerbation, observation reassessment   After These Interventions, the Patient was reevaluated and was found patient with apparent COPD exacerbation.  No evidence for acute viral or bacterial process.  Suspect inflammatory exacerbation etiology.  She is stable for discharge.   CRITICAL CARE-yes Performed by: Daleen Bo  Nursing Notes Reviewed/ Care Coordinated Applicable Imaging Reviewed Interpretation of Laboratory Data incorporated into ED treatment  The patient appears reasonably screened and/or stabilized for discharge and I doubt any other medical condition or other [facility] requiring further screening, evaluation, or treatment in the ED at this time prior to discharge.  Plan: Home Medications-continue usual; Home Treatments-rest, fluids; return here if the recommended treatment, does not improve the symptoms; Recommended follow up-PCP, checkup 1 week and as needed     Final Clinical Impression(s) / ED Diagnoses Final diagnoses:  COPD exacerbation ([place])    Rx / DC Orders ED Discharge Orders         Ordered    predniSONE (DELTASONE) 20 MG tablet   2 times daily     10/22/19 2232           Daleen Bo, MD 10/22/19 2237

## 2022-08-24 ENCOUNTER — Other Ambulatory Visit: Payer: Self-pay

## 2022-08-24 ENCOUNTER — Emergency Department (HOSPITAL_COMMUNITY): Payer: Medicaid Other

## 2022-08-24 ENCOUNTER — Emergency Department (HOSPITAL_COMMUNITY)
Admission: EM | Admit: 2022-08-24 | Discharge: 2022-08-25 | Disposition: A | Discharge status: Home / Self Care | Payer: Medicaid Other | Attending: Emergency Medicine | Admitting: Emergency Medicine

## 2022-08-24 ENCOUNTER — Encounter (HOSPITAL_COMMUNITY): Payer: Self-pay

## 2022-08-24 DIAGNOSIS — F13230 Sedative, hypnotic or anxiolytic dependence with withdrawal, uncomplicated: Secondary | ICD-10-CM | POA: Diagnosis not present

## 2022-08-24 DIAGNOSIS — Z21 Asymptomatic human immunodeficiency virus [HIV] infection status: Secondary | ICD-10-CM | POA: Insufficient documentation

## 2022-08-24 DIAGNOSIS — F419 Anxiety disorder, unspecified: Secondary | ICD-10-CM | POA: Insufficient documentation

## 2022-08-24 DIAGNOSIS — F13930 Sedative, hypnotic or anxiolytic use, unspecified with withdrawal, uncomplicated: Secondary | ICD-10-CM

## 2022-08-24 DIAGNOSIS — J4541 Moderate persistent asthma with (acute) exacerbation: Secondary | ICD-10-CM | POA: Diagnosis not present

## 2022-08-24 DIAGNOSIS — R0602 Shortness of breath: Secondary | ICD-10-CM | POA: Insufficient documentation

## 2022-08-24 DIAGNOSIS — J449 Chronic obstructive pulmonary disease, unspecified: Secondary | ICD-10-CM | POA: Insufficient documentation

## 2022-08-24 DIAGNOSIS — J441 Chronic obstructive pulmonary disease with (acute) exacerbation: Secondary | ICD-10-CM

## 2022-08-24 LAB — BASIC METABOLIC PANEL
Anion gap: 8 (ref 5–15)
BUN: 17 mg/dL (ref 6–20)
CO2: 31 mmol/L (ref 22–32)
Calcium: 9.1 mg/dL (ref 8.9–10.3)
Chloride: 94 mmol/L — ABNORMAL LOW (ref 98–111)
Creatinine, Ser: 0.57 mg/dL (ref 0.44–1.00)
GFR, Estimated: >60 mL/min (ref >60)
Glucose, Bld: 128 mg/dL — ABNORMAL HIGH (ref 70–99)
Potassium: 3.2 mmol/L — ABNORMAL LOW (ref 3.5–5.1)
Sodium: 133 mmol/L — ABNORMAL LOW (ref 135–145)

## 2022-08-24 LAB — CBC WITH DIFFERENTIAL/PLATELET
Abs Immature Granulocytes: 0.06 10*3/uL (ref 0.00–0.07)
Basophils Absolute: 0.1 10*3/uL (ref 0.0–0.1)
Basophils Relative: 1 %
Eosinophils Absolute: 0.2 10*3/uL (ref 0.0–0.5)
Eosinophils Relative: 2 %
HCT: 40 % (ref 36.0–46.0)
Hemoglobin: 13.4 g/dL (ref 12.0–15.0)
Immature Granulocytes: 1 %
Lymphocytes Relative: 33 %
Lymphs Abs: 4 10*3/uL (ref 0.7–4.0)
MCH: 30 pg (ref 26.0–34.0)
MCHC: 33.5 g/dL (ref 30.0–36.0)
MCV: 89.5 fL (ref 80.0–100.0)
Monocytes Absolute: 0.9 10*3/uL (ref 0.1–1.0)
Monocytes Relative: 8 %
Neutro Abs: 6.9 10*3/uL (ref 1.7–7.7)
Neutrophils Relative %: 55 %
Platelets: 364 10*3/uL (ref 150–400)
RBC: 4.47 MIL/uL (ref 3.87–5.11)
RDW: 13.8 % (ref 11.5–15.5)
WBC: 12.2 10*3/uL — ABNORMAL HIGH (ref 4.0–10.5)
nRBC: 0 % (ref 0.0–0.2)

## 2022-08-24 MED ORDER — IPRATROPIUM-ALBUTEROL 0.5-2.5 (3) MG/3ML IN SOLN
3.0000 mL | Freq: Once | RESPIRATORY_TRACT | Status: AC
  Administered 2022-08-24 – 2022-08-25 (×2): 3 mL via RESPIRATORY_TRACT
  Filled 2022-08-24: qty 3

## 2022-08-24 MED ORDER — METHYLPREDNISOLONE SODIUM SUCC 125 MG IJ SOLR
125.0000 mg | Freq: Once | INTRAMUSCULAR | Status: AC
  Administered 2022-08-24: 125 mg via INTRAVENOUS
  Filled 2022-08-24: qty 2

## 2022-08-24 MED ORDER — LORAZEPAM 2 MG/ML IJ SOLN
1.0000 mg | Freq: Once | INTRAMUSCULAR | Status: AC
  Administered 2022-08-24: 1 mg via INTRAVENOUS
  Filled 2022-08-24: qty 1

## 2022-08-24 NOTE — ED Provider Notes (Signed)
[facility] Provider Note   CSN: [id number] Arrival date & time: 08/24/22  1849     History  Chief Complaint  Patient presents with   Shortness of Breath   Anxiety    Elizabeth [name] is a 52 y.o. female.  Patient here with a lot of anxiety.  Patient chronically on Valium.  Ran out of it 2 days ago.  Patient also has a history of asthma normally on 4 L of oxygen at all times suspect at her age this may be COPD.  Patient everyday smoker.  Patient's previous visits Oct 22, 2019 COPD exacerbation.  Patient with wheezing.  But patient with a lot of anxiety palpitations diaphoresis having some muscle spasms.  Feel that this is probably benzo diazepam withdrawal.  Past medical history significant for HIV on stroke bid.  Substance abuse COPD/asthma history of migraines depression history of seizures but has not taken any Depakote since 2020.  By the time I saw the patient we had already ordered her some Ativan and she was improving significantly from probably the benzo withdrawal.  But was still wheezing.       Home Medications Prior to Admission medications   Medication Sig Start Date End Date Taking? Authorizing Provider  albuterol (PROVENTIL HFA;VENTOLIN HFA) 108 (90 BASE) MCG/ACT inhaler Inhale 2 puffs into the lungs every 6 (six) hours as needed for wheezing or shortness of breath.    [provider]  albuterol (PROVENTIL) (2.5 MG/3ML) 0.083% nebulizer solution Take 6 mLs (5 mg total) by nebulization every 6 (six) hours as needed for wheezing or shortness of breath. 10/28/13   Kirichenko, Lahoma Rocker, PA-C  atorvastatin (LIPITOR) 20 MG tablet Take 20 mg by mouth daily.    [provider]  azithromycin (ZITHROMAX) 250 MG tablet Take 2 tablets together on the first day, then 1 every day until finished. Patient not taking: Reported on 10/22/2019 10/07/19   Maudie Flakes, MD  gabapentin (NEURONTIN) 300 MG capsule Take 300 mg by mouth 3  (three) times daily.  09/14/18   [provider]  GENVOYA 150-150-200-10 MG TABS tablet Take 1 tablet by mouth every morning.  03/19/18   [provider]  ipratropium-albuterol (DUONEB) 0.5-2.5 (3) MG/3ML SOLN Inhale 3 mLs into the lungs every 6 (six) hours as needed (for shortness of breath-wheezing).  03/19/18   [provider]  levETIRAcetam (KEPPRA) 750 MG tablet Take 750 mg by mouth 2 (two) times daily.  03/19/18   [provider]  mometasone-formoterol (DULERA) 100-5 MCG/ACT AERO Inhale 2 puffs into the lungs 2 (two) times daily.    [provider]  ondansetron (ZOFRAN) 4 MG tablet Take 1 tablet (4 mg total) by mouth every 8 (eight) hours as needed for nausea or vomiting. 08/31/19   Manuella Ghazi, Pratik D, DO  oxyCODONE ER (XTAMPZA ER) 13.5 MG C12A Take 13.5 mg by mouth daily as needed (pain).    [provider]  oxyCODONE-acetaminophen (PERCOCET/ROXICET) 5-325 MG tablet Take 1 tablet by mouth 4 (four) times daily as needed for moderate pain.    [provider]  predniSONE (DELTASONE) 20 MG tablet Take 1 tablet (20 mg total) by mouth 2 (two) times daily. 10/22/19   Daleen Bo, MD  SPIRIVA HANDIHALER 18 MCG inhalation capsule Place 18 mcg into inhaler and inhale daily.  03/19/18   [provider]  SYMBICORT 160-4.5 MCG/ACT inhaler Inhale 2 puffs into the lungs 2 (two) times daily.  03/19/18  [provider]  valACYclovir (VALTREX) 500 MG tablet Take 500 mg by mouth 2 (two) times daily.    [provider]  Vitamin D, Ergocalciferol, (DRISDOL) 1.25 MG (50000 UT) CAPS capsule Take 1 capsule by mouth once a week. 06/18/18   [provider]      Allergies    Penicillins, Ibuprofen, Morphine, Cariprazine, and Tramadol    Review of Systems   Review of Systems  Constitutional:  Positive for diaphoresis. Negative for chills and fever.  HENT:  Negative for ear pain and sore throat.   Eyes:  Negative for pain  and visual disturbance.  Respiratory:  Positive for shortness of breath and wheezing. Negative for cough.   Cardiovascular:  Negative for chest pain and palpitations.  Gastrointestinal:  Negative for abdominal pain and vomiting.  Genitourinary:  Negative for dysuria and hematuria.  Musculoskeletal:  Negative for arthralgias and back pain.  Skin:  Negative for color change and rash.  Neurological:  Negative for seizures and syncope.  Psychiatric/Behavioral:  The patient is nervous/anxious.   All other systems reviewed and are negative.   Physical Exam Updated Vital Signs BP (!) 163/111   Pulse 89   Temp 97.9 F (36.6 C) (Oral)   Resp 17   Ht 1.524 m (5')   Wt 79.8 kg   SpO2 94%   BMI 34.37 kg/m  Physical Exam Vitals and nursing note reviewed.  Constitutional:      General: She is not in acute distress.    Appearance: She is well-developed. She is ill-appearing. She is not diaphoretic.  HENT:     Head: Normocephalic and atraumatic.     Mouth/Throat:     Mouth: Mucous membranes are moist.  Eyes:     Extraocular Movements: Extraocular movements intact.     Conjunctiva/sclera: Conjunctivae normal.     Pupils: Pupils are equal, round, and reactive to light.  Cardiovascular:     Rate and Rhythm: Normal rate and regular rhythm.     Heart sounds: No murmur heard. Pulmonary:     Effort: Pulmonary effort is normal. No respiratory distress.     Breath sounds: No stridor. Wheezing present. No rhonchi or rales.  Abdominal:     Palpations: Abdomen is soft.     Tenderness: There is no abdominal tenderness.  Musculoskeletal:        General: No swelling.     Cervical back: Neck supple.  Skin:    General: Skin is warm and dry.     Capillary Refill: Capillary refill takes less than 2 seconds.  Neurological:     General: No focal deficit present.     Mental Status: She is alert and oriented to person, place, and time.     Cranial Nerves: No cranial nerve deficit.     Sensory: No  sensory deficit.     Motor: No weakness.  Psychiatric:        Mood and Affect: Mood normal.     ED Results / Procedures / Treatments   Labs (all labs ordered are listed, but only abnormal results are displayed) Labs Reviewed  CBC WITH DIFFERENTIAL/PLATELET - Abnormal; Notable for the following components:      Result Value   WBC 12.2 (*)    All other components within normal limits  BASIC METABOLIC PANEL - Abnormal; Notable for the following components:   Sodium 133 (*)    Potassium 3.2 (*)    Chloride 94 (*)    Glucose, Bld 128 (*)  All other components within normal limits    EKG EKG Interpretation  Date/Time:  Saturday August 24 2022 20:26:42 EDT Ventricular Rate:  103 PR Interval:  132 QRS Duration: 89 QT Interval:  388 QTC Calculation: 508 R Axis:   70 Text Interpretation: Sinus tachycardia Right atrial enlargement Borderline prolonged QT interval Confirmed by Fredia Sorrow [phone]) on 08/24/2022 8:31:59 PM  Radiology DG Chest Port 1 View  Result Date: 08/24/2022 CLINICAL DATA:  SOB EXAM: PORTABLE CHEST 1 VIEW.  Patient is rotated COMPARISON:  Chest x-ray 10/22/2019, abdomen pelvis 08/29/2019 FINDINGS: The heart and mediastinal contours are unchanged. Biapical pleural/pulmonary scarring. Visual changes of the right lung with pulmonary sutures and vascular clips noted. No focal consolidation. Chronic coarsened markings with no overt pulmonary edema. Persistent blunting of the right costophrenic angle with trace pleural effusion not excluded. No pneumothorax. No acute osseous abnormality. IMPRESSION: Persistent blunting of the right costophrenic angle with trace pleural effusion not excluded. Patient with chronic surgical changes of the right lung. Electronically Signed   By: Iven Finn M.D.   On: 08/24/2022 20:30    Procedures Procedures    Medications Ordered in ED Medications  LORazepam (ATIVAN) injection 1 mg (1 mg Intravenous Given 08/24/22 2048)   LORazepam (ATIVAN) injection 1 mg (1 mg Intravenous Given 08/24/22 2205)  ipratropium-albuterol (DUONEB) 0.5-2.5 (3) MG/3ML nebulizer solution 3 mL (3 mLs Nebulization Given 08/24/22 2217)  methylPREDNISolone sodium succinate (SOLU-MEDROL) 125 mg/2 mL injection 125 mg (125 mg Intravenous Given 08/24/22 2203)    ED Course/ Medical Decision Making/ A&P                             Medical Decision Making Amount and/or Complexity of Data Reviewed Labs: ordered. Radiology: ordered.  Risk Prescription drug management.   His oxygen saturations on the 4 L was 97%.  But still had wheezing throughout.  Ordered nebulizer treatment and IV Solu-Medrol.  CBC white count was 12.2 hemoglobin 13 platelets are normal.  Basic metabolic panel sodium [id number] potassium 3.2 chloride 94 glucose 128 renal functions normal.  Chest x-ray persistent blunting of the right costophrenic and trace pleural effusion not excluded patient with chronic surgical changes right lung.  Otherwise no significant changes from old.  EKG  Sinus tachycardia with heart rate 103 and borderline prolonged QT.   Patient had a second dose of Ativan.  Now resting comfortably still wheezing.  Feel that patient will be good for discharge home once the steroids kick in and would go home with steroids says she has albuterol inhaler at home.  Patient may need a short course of Valium as well.  Do not feel that we need to go ahead and admit the patient at this time since we have no beds available or can observe her here and that she will probably be stable enough for discharge home.  Oxygen has been good.   Final Clinical Impression(s) / ED Diagnoses Final diagnoses:  Moderate persistent asthma with exacerbation  Benzodiazepine withdrawal without complication [facility])    Rx / DC Orders ED Discharge Orders     None         Fredia Sorrow, MD 08/25/22 0004

## 2022-08-24 NOTE — ED Triage Notes (Signed)
Pt hypertensive in triage at 203/123. Denies any BP medications. Endorses headache 8/10.

## 2022-08-24 NOTE — ED Triage Notes (Signed)
Pt endorses increased anxiety over the last few days, worse last night. Palpitations, diaphoresis, SHOB, muscle spasms.   Pt normally takes valium, is currently out of medication. Last dose last night. Pt able to speak in full sentences, tearful in triage.   Pt chronically wears 4L o2 [place].

## 2022-08-24 NOTE — ED Notes (Signed)
ED Provider at bedside. 

## 2022-08-25 ENCOUNTER — Other Ambulatory Visit: Payer: Self-pay

## 2022-08-25 ENCOUNTER — Emergency Department (HOSPITAL_COMMUNITY)
Admission: EM | Admit: 2022-08-25 | Discharge: 2022-08-25 | Disposition: A | Discharge status: Home / Self Care | Payer: Medicaid Other | Source: Home / Self Care | Attending: Emergency Medicine | Admitting: Emergency Medicine

## 2022-08-25 ENCOUNTER — Emergency Department (HOSPITAL_COMMUNITY): Payer: Medicaid Other

## 2022-08-25 ENCOUNTER — Encounter (HOSPITAL_COMMUNITY): Payer: Self-pay

## 2022-08-25 DIAGNOSIS — F419 Anxiety disorder, unspecified: Secondary | ICD-10-CM | POA: Insufficient documentation

## 2022-08-25 DIAGNOSIS — J449 Chronic obstructive pulmonary disease, unspecified: Secondary | ICD-10-CM | POA: Insufficient documentation

## 2022-08-25 DIAGNOSIS — R0602 Shortness of breath: Secondary | ICD-10-CM | POA: Insufficient documentation

## 2022-08-25 DIAGNOSIS — B20 Human immunodeficiency virus [HIV] disease: Secondary | ICD-10-CM | POA: Insufficient documentation

## 2022-08-25 LAB — CBC WITH DIFFERENTIAL/PLATELET
Abs Immature Granulocytes: 0.05 10*3/uL (ref 0.00–0.07)
Basophils Absolute: 0 10*3/uL (ref 0.0–0.1)
Basophils Relative: 0 %
Eosinophils Absolute: 0 10*3/uL (ref 0.0–0.5)
Eosinophils Relative: 0 %
HCT: 41.4 % (ref 36.0–46.0)
Hemoglobin: 14.1 g/dL (ref 12.0–15.0)
Immature Granulocytes: 1 %
Lymphocytes Relative: 17 %
Lymphs Abs: 1.4 10*3/uL (ref 0.7–4.0)
MCH: 29.9 pg (ref 26.0–34.0)
MCHC: 34.1 g/dL (ref 30.0–36.0)
MCV: 87.9 fL (ref 80.0–100.0)
Monocytes Absolute: 0.1 10*3/uL (ref 0.1–1.0)
Monocytes Relative: 2 %
Neutro Abs: 6.5 10*3/uL (ref 1.7–7.7)
Neutrophils Relative %: 80 %
Platelets: 386 10*3/uL (ref 150–400)
RBC: 4.71 MIL/uL (ref 3.87–5.11)
RDW: 13.9 % (ref 11.5–15.5)
WBC: 8.1 10*3/uL (ref 4.0–10.5)
nRBC: 0 % (ref 0.0–0.2)

## 2022-08-25 LAB — COMPREHENSIVE METABOLIC PANEL
ALT: 11 U/L (ref 0–44)
AST: 12 U/L — ABNORMAL LOW (ref 15–41)
Albumin: 4.4 g/dL (ref 3.5–5.0)
Alkaline Phosphatase: 75 U/L (ref 38–126)
Anion gap: 10 (ref 5–15)
BUN: 20 mg/dL (ref 6–20)
CO2: 29 mmol/L (ref 22–32)
Calcium: 9.7 mg/dL (ref 8.9–10.3)
Chloride: 95 mmol/L — ABNORMAL LOW (ref 98–111)
Creatinine, Ser: 0.65 mg/dL (ref 0.44–1.00)
GFR, Estimated: >60 mL/min (ref >60)
Glucose, Bld: 214 mg/dL — ABNORMAL HIGH (ref 70–99)
Potassium: 3.9 mmol/L (ref 3.5–5.1)
Sodium: 134 mmol/L — ABNORMAL LOW (ref 135–145)
Total Bilirubin: 0.7 mg/dL (ref 0.3–1.2)
Total Protein: 7.9 g/dL (ref 6.5–8.1)

## 2022-08-25 MED ORDER — LORAZEPAM 2 MG/ML IJ SOLN
1.0000 mg | Freq: Once | INTRAMUSCULAR | Status: AC
  Administered 2022-08-25: 1 mg via INTRAVENOUS
  Filled 2022-08-25: qty 1

## 2022-08-25 MED ORDER — DIAZEPAM 5 MG PO TABS: 5.0000 mg | ORAL_TABLET | Freq: Two times a day (BID) | ORAL | 0 refills | Status: DC | PRN

## 2022-08-25 MED ORDER — DIAZEPAM 5 MG PO TABS: 5.0000 mg | ORAL_TABLET | Freq: Two times a day (BID) | ORAL | 0 refills | Status: AC | PRN

## 2022-08-25 MED ORDER — DIAZEPAM 5 MG PO TABS: 5.0000 mg | ORAL_TABLET | Freq: Three times a day (TID) | ORAL | 0 refills | Status: DC | PRN

## 2022-08-25 MED ORDER — MAGNESIUM SULFATE 2 GM/50ML IV SOLN
2.0000 g | Freq: Once | INTRAVENOUS | Status: AC
  Administered 2022-08-25: 2 g via INTRAVENOUS
  Filled 2022-08-25: qty 50

## 2022-08-25 MED ORDER — PREDNISONE 10 MG PO TABS: 40.0000 mg | ORAL_TABLET | Freq: Every day | ORAL | 0 refills | Status: DC

## 2022-08-25 MED ORDER — ALBUTEROL SULFATE (2.5 MG/3ML) 0.083% IN NEBU
2.5000 mg | INHALATION_SOLUTION | Freq: Once | RESPIRATORY_TRACT | Status: AC
  Administered 2022-08-25: 2.5 mg via RESPIRATORY_TRACT
  Filled 2022-08-25: qty 3

## 2022-08-25 MED ORDER — IPRATROPIUM-ALBUTEROL 0.5-2.5 (3) MG/3ML IN SOLN
3.0000 mL | Freq: Once | RESPIRATORY_TRACT | Status: AC
  Administered 2022-08-25: 3 mL via RESPIRATORY_TRACT
  Filled 2022-08-25: qty 3

## 2022-08-25 MED ORDER — IPRATROPIUM-ALBUTEROL 0.5-2.5 (3) MG/3ML IN SOLN
RESPIRATORY_TRACT | Status: AC
  Filled 2022-08-25: qty 3

## 2022-08-25 NOTE — ED Triage Notes (Signed)
Pt arrived from [facility] with c/o SOB and increased anxiety, pt was here last night and discharged around 2 am for the same complaints, states it has gotten worse today. Pt was taking Valium and messed up her dosage and can not get another refill until 08/27/22. EMS administered albuterol neb and a duo neb tx.

## 2022-08-25 NOTE — ED Notes (Signed)
Pt taken to the bathroom via wheelchair. Once back in the bed, pt asked if she could get a valium. Edp made aware

## 2022-08-25 NOTE — Discharge Instructions (Signed)
Follow up with your doctor this week.

## 2022-08-25 NOTE — ED Notes (Signed)
RT called to assess pt. Pt c/o being sob

## 2022-08-25 NOTE — Discharge Instructions (Addendum)
Continue use your albuterol inhaler.  Take the prednisone as directed.  Follow-up with your providers for your chronic medications including your Valium.

## 2022-08-25 NOTE — ED Provider Notes (Signed)
[facility] Provider Note   CSN: [id number] Arrival date & time: 08/25/22  [id number]     History {Add pertinent medical, surgical, social history, OB history to HPI:1} Chief Complaint  Patient presents with   Anxiety   Shortness of Breath    Elizabeth [name] is a 52 y.o. female.  Patient with history of COPD and anxiety along with HIV positive.  She ran out of her Valium and complains of anxiety and some shortness of breath   Anxiety Associated symptoms include shortness of breath.  Shortness of Breath      Home Medications Prior to Admission medications   Medication Sig Start Date End Date Taking? Authorizing Provider  albuterol (PROVENTIL HFA;VENTOLIN HFA) 108 (90 BASE) MCG/ACT inhaler Inhale 2 puffs into the lungs every 6 (six) hours as needed for wheezing or shortness of breath.    [provider]  albuterol (PROVENTIL) (2.5 MG/3ML) 0.083% nebulizer solution Take 6 mLs (5 mg total) by nebulization every 6 (six) hours as needed for wheezing or shortness of breath. 10/28/13   Kirichenko, Lahoma Rocker, PA-C  atorvastatin (LIPITOR) 20 MG tablet Take 20 mg by mouth daily.    [provider]  azithromycin (ZITHROMAX) 250 MG tablet Take 2 tablets together on the first day, then 1 every day until finished. Patient not taking: Reported on 10/22/2019 10/07/19   Maudie Flakes, MD  diazepam (VALIUM) 5 MG tablet Take 1 tablet (5 mg total) by mouth every 12 (twelve) hours as needed for anxiety (spasms). 08/25/22   Milton Ferguson, MD  gabapentin (NEURONTIN) 300 MG capsule Take 300 mg by mouth 3 (three) times daily.  09/14/18   [provider]  GENVOYA 150-150-200-10 MG TABS tablet Take 1 tablet by mouth every morning.  03/19/18   [provider]  ipratropium-albuterol (DUONEB) 0.5-2.5 (3) MG/3ML SOLN Inhale 3 mLs into the lungs every 6 (six) hours as needed (for shortness of breath-wheezing).  03/19/18   [provider]  levETIRAcetam (KEPPRA) 750 MG tablet Take 750 mg by mouth 2 (two) times daily.  03/19/18   [provider]  mometasone-formoterol (DULERA) 100-5 MCG/ACT AERO Inhale 2 puffs into the lungs 2 (two) times daily.    [provider]  ondansetron (ZOFRAN) 4 MG tablet Take 1 tablet (4 mg total) by mouth every 8 (eight) hours as needed for nausea or vomiting. 08/31/19   Manuella Ghazi, Pratik D, DO  oxyCODONE ER (XTAMPZA ER) 13.5 MG C12A Take 13.5 mg by mouth daily as needed (pain).    [provider]  oxyCODONE-acetaminophen (PERCOCET/ROXICET) 5-325 MG tablet Take 1 tablet by mouth 4 (four) times daily as needed for moderate pain.    [provider]  predniSONE (DELTASONE) 10 MG tablet Take 4 tablets (40 mg total) by mouth daily. 08/25/22   Fredia Sorrow, MD  predniSONE (DELTASONE) 20 MG tablet Take 1 tablet (20 mg total) by mouth 2 (two) times daily. 10/22/19   Daleen Bo, MD  SPIRIVA HANDIHALER 18 MCG inhalation capsule Place 18 mcg into inhaler and inhale daily.  03/19/18   [provider]  SYMBICORT 160-4.5 MCG/ACT inhaler Inhale 2 puffs into the lungs 2 (two) times daily.  03/19/18   [provider]  valACYclovir (VALTREX) 500 MG tablet Take 500 mg by mouth 2 (two) times daily.    [provider]  Vitamin D, Ergocalciferol, (DRISDOL) 1.25 MG (50000 UT) CAPS capsule Take 1 capsule by mouth once a week. 06/18/18  [provider]      Allergies    Penicillins, Ibuprofen, Morphine, Cariprazine, and Tramadol    Review of Systems   Review of Systems  Respiratory:  Positive for shortness of breath.     Physical Exam Updated Vital Signs BP 127/82   Pulse (!) 113   Temp 98.3 F (36.8 C) (Oral)   Resp (!) 29   Ht 5' (1.524 m)   Wt 79.8 kg   SpO2 100%   BMI 34.36 kg/m  Physical Exam  ED Results / Procedures / Treatments   Labs (all labs ordered are listed, but only abnormal results are displayed) Labs Reviewed   COMPREHENSIVE METABOLIC PANEL - Abnormal; Notable for the following components:      Result Value   Sodium 134 (*)    Chloride 95 (*)    Glucose, Bld 214 (*)    AST 12 (*)    All other components within normal limits  CBC WITH DIFFERENTIAL/PLATELET    EKG None  Radiology DG Chest Port 1 View  Result Date: 08/25/2022 CLINICAL DATA:  sob EXAM: PORTABLE CHEST - 1 VIEW COMPARISON:  08/24/2022 FINDINGS: Stable postop changes in the right mid lung. Improved aeration at the left lung base. Heart size and mediastinal contours are within normal limits. Blunting of the right lateral costophrenic angle.  No pneumothorax. Visualized bones unremarkable. IMPRESSION: Improved aeration at the left lung base. No acute findings. Electronically Signed   By: Lucrezia Europe M.D.   On: 08/25/2022 09:01   DG Chest Port 1 View  Result Date: 08/24/2022 CLINICAL DATA:  SOB EXAM: PORTABLE CHEST 1 VIEW.  Patient is rotated COMPARISON:  Chest x-ray 10/22/2019, abdomen pelvis 08/29/2019 FINDINGS: The heart and mediastinal contours are unchanged. Biapical pleural/pulmonary scarring. Visual changes of the right lung with pulmonary sutures and vascular clips noted. No focal consolidation. Chronic coarsened markings with no overt pulmonary edema. Persistent blunting of the right costophrenic angle with trace pleural effusion not excluded. No pneumothorax. No acute osseous abnormality. IMPRESSION: Persistent blunting of the right costophrenic angle with trace pleural effusion not excluded. Patient with chronic surgical changes of the right lung. Electronically Signed   By: Iven Finn M.D.   On: 08/24/2022 20:30    Procedures Procedures  {Document cardiac monitor, telemetry assessment procedure when appropriate:1}  Medications Ordered in ED Medications  LORazepam (ATIVAN) injection 1 mg (1 mg Intravenous Given 08/25/22 0910)  ipratropium-albuterol (DUONEB) 0.5-2.5 (3) MG/3ML nebulizer solution 3 mL (3 mLs Nebulization  Given 08/25/22 0907)  albuterol (PROVENTIL) (2.5 MG/3ML) 0.083% nebulizer solution 2.5 mg (2.5 mg Nebulization Given 08/25/22 0908)  magnesium sulfate IVPB 2 g 50 mL (0 g Intravenous Stopped 08/25/22 1019)    ED Course/ Medical Decision Making/ A&P   {   Click here for ABCD2, HEART and other calculatorsREFRESH Note before signing :1}                          Medical Decision Making Amount and/or Complexity of Data Reviewed Labs: ordered. Radiology: ordered. ECG/medicine tests: ordered.  Risk Prescription drug management.   Patient with mild exacerbation of COPD and anxiety.  She is given a prescription of Valium for 10 tablets and the prescription last night was canceled that was sent to her pharmacy down.  She will follow-up with her PCP  {Document critical care time when appropriate:1} {Document review of labs and clinical decision tools ie heart score, Chads2Vasc2 etc:1}  {Document your independent  review of radiology images, and any outside records:1} {Document your discussion with family members, caretakers, and with consultants:1} {Document social determinants of health affecting pt's care:1} {Document your decision making why or why not admission, treatments were needed:1} Final Clinical Impression(s) / ED Diagnoses Final diagnoses:  Anxiety    Rx / DC Orders ED Discharge Orders          Ordered    diazepam (VALIUM) 5 MG tablet  Every 12 hours PRN,   Status:  Discontinued        08/25/22 1059    diazepam (VALIUM) 5 MG tablet  Every 12 hours PRN        08/25/22 1059

## 2023-01-11 ENCOUNTER — Emergency Department (HOSPITAL_COMMUNITY)
Admission: EM | Admit: 2023-01-11 | Discharge: 2023-01-11 | Disposition: A | Discharge status: Home / Self Care | Payer: MEDICAID | Attending: Emergency Medicine | Admitting: Emergency Medicine

## 2023-01-11 ENCOUNTER — Encounter (HOSPITAL_COMMUNITY): Payer: Self-pay | Admitting: Emergency Medicine

## 2023-01-11 ENCOUNTER — Other Ambulatory Visit: Payer: Self-pay

## 2023-01-11 DIAGNOSIS — M5441 Lumbago with sciatica, right side: Secondary | ICD-10-CM | POA: Diagnosis not present

## 2023-01-11 DIAGNOSIS — Z7951 Long term (current) use of inhaled steroids: Secondary | ICD-10-CM | POA: Insufficient documentation

## 2023-01-11 DIAGNOSIS — M5431 Sciatica, right side: Secondary | ICD-10-CM

## 2023-01-11 DIAGNOSIS — J449 Chronic obstructive pulmonary disease, unspecified: Secondary | ICD-10-CM | POA: Diagnosis not present

## 2023-01-11 DIAGNOSIS — M545 Low back pain, unspecified: Secondary | ICD-10-CM | POA: Diagnosis present

## 2023-01-11 MED ORDER — DEXAMETHASONE SODIUM PHOSPHATE 10 MG/ML IJ SOLN
10.0000 mg | Freq: Once | INTRAMUSCULAR | Status: AC
  Administered 2023-01-11: 10 mg via INTRAMUSCULAR
  Filled 2023-01-11: qty 1

## 2023-01-11 MED ORDER — METHOCARBAMOL 500 MG PO TABS: 500.0000 mg | ORAL_TABLET | Freq: Two times a day (BID) | ORAL | 0 refills | Status: AC | PRN

## 2023-01-11 MED ORDER — PREDNISONE 20 MG PO TABS: 40.0000 mg | ORAL_TABLET | Freq: Every day | ORAL | 0 refills | Status: AC

## 2023-01-11 NOTE — ED Triage Notes (Signed)
Pt complains of right lower back pain x 6 days now radiating to right thigh. Pt  denies any injury. States pain is so bad unable to walk.

## 2023-01-11 NOTE — ED Provider Notes (Signed)
[place] EMERGENCY DEPARTMENT AT [facility] Provider Note   CSN: [id number] Arrival date & time: 01/11/23  2010     History  Chief Complaint  Patient presents with   Back Pain    Elizabeth [name] is a 52 y.o. female.   Back Pain  This patient is a 52 year old female, she has a history of chronic COPD on 2 to 3 L of oxygen all the time, she states that she has no shortness of breath at this time.  She complains only of having right lower back pain which started a couple of days ago, seems to have worsened, and is now radiating down the back of the leg towards the knee.  It does not go down to the foot and there is no numbness or weakness.  There is no fevers or chills, no IV drug use, the patient denies any history of prior back problems.    Home Medications Prior to Admission medications   Medication Sig Start Date End Date Taking? Authorizing Provider  methocarbamol (ROBAXIN) 500 MG tablet Take 1 tablet (500 mg total) by mouth 2 (two) times daily as needed for muscle spasms. 01/11/23  Yes Eber Hong, MD  predniSONE (DELTASONE) 20 MG tablet Take 2 tablets (40 mg total) by mouth daily. 01/11/23  Yes Eber Hong, MD  albuterol (PROVENTIL HFA;VENTOLIN HFA) 108 (90 BASE) MCG/ACT inhaler Inhale 2 puffs into the lungs every 6 (six) hours as needed for wheezing or shortness of breath.    [provider]  albuterol (PROVENTIL) (2.5 MG/3ML) 0.083% nebulizer solution Take 6 mLs (5 mg total) by nebulization every 6 (six) hours as needed for wheezing or shortness of breath. 10/28/13   Kirichenko, Lemont Fillers, PA-C  atorvastatin (LIPITOR) 20 MG tablet Take 20 mg by mouth daily.    [provider]  diazepam (VALIUM) 5 MG tablet Take 1 tablet (5 mg total) by mouth every 12 (twelve) hours as needed for anxiety (spasms). 08/25/22   Bethann Berkshire, MD  gabapentin (NEURONTIN) 300 MG capsule Take 300 mg by mouth 3 (three) times daily.  09/14/18   [provider]  GENVOYA  150-150-200-10 MG TABS tablet Take 1 tablet by mouth every morning.  03/19/18   [provider]  ipratropium-albuterol (DUONEB) 0.5-2.5 (3) MG/3ML SOLN Inhale 3 mLs into the lungs every 6 (six) hours as needed (for shortness of breath-wheezing).  03/19/18   [provider]  levETIRAcetam (KEPPRA) 750 MG tablet Take 750 mg by mouth 2 (two) times daily.  03/19/18   [provider]  mometasone-formoterol (DULERA) 100-5 MCG/ACT AERO Inhale 2 puffs into the lungs 2 (two) times daily.    [provider]  ondansetron (ZOFRAN) 4 MG tablet Take 1 tablet (4 mg total) by mouth every 8 (eight) hours as needed for nausea or vomiting. 08/31/19   Sherryll Burger, Pratik D, DO  oxyCODONE ER (XTAMPZA ER) 13.5 MG C12A Take 13.5 mg by mouth daily as needed (pain).    [provider]  oxyCODONE-acetaminophen (PERCOCET/ROXICET) 5-325 MG tablet Take 1 tablet by mouth 4 (four) times daily as needed for moderate pain.    [provider]  SPIRIVA HANDIHALER 18 MCG inhalation capsule Place 18 mcg into inhaler and inhale daily.  03/19/18   [provider]  SYMBICORT 160-4.5 MCG/ACT inhaler Inhale 2 puffs into the lungs 2 (two) times daily.  03/19/18   [provider]  valACYclovir (VALTREX) 500 MG tablet Take 500 mg by mouth 2 (two) times daily.  [provider]  Vitamin D, Ergocalciferol, (DRISDOL) 1.25 MG (50000 UT) CAPS capsule Take 1 capsule by mouth once a week. 06/18/18   [provider]      Allergies    Penicillins, Ibuprofen, Morphine, Cariprazine, and Tramadol    Review of Systems   Review of Systems  Musculoskeletal:  Positive for back pain.  All other systems reviewed and are negative.   Physical Exam Updated Vital Signs BP (!) 145/113 (BP Location: Right Arm)   Pulse 91   Temp 99.8 F (37.7 C) (Oral)   Resp 18   Ht 1.524 m (5')   Wt 75.8 kg   SpO2 100%   BMI 32.61 kg/m  Physical Exam Vitals and nursing note reviewed.   Constitutional:      General: She is not in acute distress.    Appearance: She is well-developed.  HENT:     Head: Normocephalic and atraumatic.     Mouth/Throat:     Pharynx: No oropharyngeal exudate.  Eyes:     General: No scleral icterus.       Right eye: No discharge.        Left eye: No discharge.     Conjunctiva/sclera: Conjunctivae normal.     Pupils: Pupils are equal, round, and reactive to light.  Neck:     Thyroid: No thyromegaly.     Vascular: No JVD.  Cardiovascular:     Rate and Rhythm: Normal rate and regular rhythm.     Heart sounds: Normal heart sounds. No murmur heard.    No friction rub. No gallop.  Pulmonary:     Effort: Pulmonary effort is normal. No respiratory distress.     Breath sounds: Wheezing present. No rales.     Comments: Speaks in full sentences, diffuse wheezing, patient states this is chronic Abdominal:     General: Bowel sounds are normal. There is no distension.     Palpations: Abdomen is soft. There is no mass.     Tenderness: There is no abdominal tenderness.  Musculoskeletal:        General: Tenderness present. Normal range of motion.     Cervical back: Normal range of motion and neck supple.     Comments: Focal tenderness right lower back and right upper buttock, no rash located in this area  Lymphadenopathy:     Cervical: No cervical adenopathy.  Skin:    General: Skin is warm and dry.     Findings: No erythema or rash.  Neurological:     Mental Status: She is alert.     Coordination: Coordination normal.     Comments: The patient is able to straight leg raise bilaterally, this does cause some pain in the back of the right posterior buttocks but does not cause any pain in the lower legs.  She has normal sensation bilaterally to the both legs, normal reflexes of the patellar tendons.  Psychiatric:        Behavior: Behavior normal.     ED Results / Procedures / Treatments   Labs (all labs ordered are listed, but only abnormal  results are displayed) Labs Reviewed - No data to display  EKG None  Radiology No results found.  Procedures Procedures    Medications Ordered in ED Medications  dexamethasone (DECADRON) injection 10 mg (has no administration in time range)    ED Course/ Medical Decision Making/ A&P  Medical Decision Making Risk Prescription drug management.   New onset sciatica Chronic steroid use, will need steroid dose and follow-up with PCP Patient agreeable to instructions and plan No high risk features to necessitate the need for advanced imaging or aggressive evaluation or inpatient management, no need for neurosurgical consultation, if she will need physical therapy  Patient agreeable to the plan        Final Clinical Impression(s) / ED Diagnoses Final diagnoses:  Sciatica, right side    Rx / DC Orders ED Discharge Orders          Ordered    predniSONE (DELTASONE) 20 MG tablet  Daily        01/11/23 2056    methocarbamol (ROBAXIN) 500 MG tablet  2 times daily PRN        01/11/23 2056              Eber Hong, MD 01/11/23 2104

## 2023-01-11 NOTE — ED Notes (Signed)
Pt is on 3l/[place] all the time.

## 2023-01-11 NOTE — Discharge Instructions (Addendum)
Your exam is consistent with sciatica, please read the attached instructions  I have given you a shot of Decadron which will help to start improving your pain but this will likely help for another week or 2.  You will need to have your family doctor have you follow-up with physical therapy, please call your family doctor on Monday to make this appointment.  ER for severe worsening pain numbness or weakness
